# Patient Record
Sex: Male | Born: 1955 | Race: White | Hispanic: No | Marital: Married | Smoking: Never smoker
Health system: Southern US, Community
[De-identification: ages and names within clinical notes are randomized; demographics above are authoritative.]

## PROBLEM LIST (undated history)

## (undated) DIAGNOSIS — N189 Chronic kidney disease, unspecified: Secondary | ICD-10-CM

## (undated) DIAGNOSIS — K219 Gastro-esophageal reflux disease without esophagitis: Secondary | ICD-10-CM

## (undated) DIAGNOSIS — T7840XA Allergy, unspecified, initial encounter: Secondary | ICD-10-CM

## (undated) DIAGNOSIS — I10 Essential (primary) hypertension: Secondary | ICD-10-CM

## (undated) DIAGNOSIS — J45909 Unspecified asthma, uncomplicated: Secondary | ICD-10-CM

## (undated) DIAGNOSIS — G454 Transient global amnesia: Secondary | ICD-10-CM

## (undated) DIAGNOSIS — R51 Headache: Secondary | ICD-10-CM

## (undated) DIAGNOSIS — R519 Headache, unspecified: Secondary | ICD-10-CM

## (undated) HISTORY — DX: Allergy, unspecified, initial encounter: T78.40XA

## (undated) HISTORY — PX: LITHOTRIPSY: SUR834

## (undated) HISTORY — PX: URETEROSCOPY: SHX842

---

## 2015-08-29 ENCOUNTER — Observation Stay: Payer: BLUE CROSS/BLUE SHIELD

## 2015-08-29 ENCOUNTER — Observation Stay
Admit: 2015-08-29 | Discharge: 2015-08-29 | Disposition: A | Discharge status: Home / Self Care | Payer: BLUE CROSS/BLUE SHIELD | Attending: Internal Medicine | Admitting: Internal Medicine

## 2015-08-29 ENCOUNTER — Observation Stay
Admission: EM | Admit: 2015-08-29 | Discharge: 2015-08-30 | Disposition: A | Discharge status: Home / Self Care | Payer: BLUE CROSS/BLUE SHIELD | Attending: Internal Medicine | Admitting: Internal Medicine

## 2015-08-29 ENCOUNTER — Encounter: Payer: Self-pay | Admitting: Emergency Medicine

## 2015-08-29 ENCOUNTER — Emergency Department: Payer: BLUE CROSS/BLUE SHIELD

## 2015-08-29 DIAGNOSIS — Z8249 Family history of ischemic heart disease and other diseases of the circulatory system: Secondary | ICD-10-CM | POA: Insufficient documentation

## 2015-08-29 DIAGNOSIS — Z820 Family history of epilepsy and other diseases of the nervous system: Secondary | ICD-10-CM | POA: Insufficient documentation

## 2015-08-29 DIAGNOSIS — K219 Gastro-esophageal reflux disease without esophagitis: Secondary | ICD-10-CM | POA: Diagnosis not present

## 2015-08-29 DIAGNOSIS — G459 Transient cerebral ischemic attack, unspecified: Secondary | ICD-10-CM

## 2015-08-29 DIAGNOSIS — N179 Acute kidney failure, unspecified: Secondary | ICD-10-CM | POA: Diagnosis not present

## 2015-08-29 DIAGNOSIS — G454 Transient global amnesia: Secondary | ICD-10-CM | POA: Diagnosis not present

## 2015-08-29 DIAGNOSIS — Z7982 Long term (current) use of aspirin: Secondary | ICD-10-CM | POA: Insufficient documentation

## 2015-08-29 DIAGNOSIS — R410 Disorientation, unspecified: Principal | ICD-10-CM | POA: Insufficient documentation

## 2015-08-29 DIAGNOSIS — I081 Rheumatic disorders of both mitral and tricuspid valves: Secondary | ICD-10-CM | POA: Diagnosis not present

## 2015-08-29 DIAGNOSIS — I10 Essential (primary) hypertension: Secondary | ICD-10-CM | POA: Diagnosis not present

## 2015-08-29 DIAGNOSIS — E860 Dehydration: Secondary | ICD-10-CM | POA: Insufficient documentation

## 2015-08-29 DIAGNOSIS — Z825 Family history of asthma and other chronic lower respiratory diseases: Secondary | ICD-10-CM | POA: Insufficient documentation

## 2015-08-29 DIAGNOSIS — Z79899 Other long term (current) drug therapy: Secondary | ICD-10-CM | POA: Insufficient documentation

## 2015-08-29 HISTORY — DX: Gastro-esophageal reflux disease without esophagitis: K21.9

## 2015-08-29 HISTORY — DX: Transient global amnesia: G45.4

## 2015-08-29 LAB — COMPREHENSIVE METABOLIC PANEL
ALT: 30 U/L (ref 17–63)
AST: 29 U/L (ref 15–41)
Albumin: 4.2 g/dL (ref 3.5–5.0)
Alkaline Phosphatase: 92 U/L (ref 38–126)
Anion gap: 7 (ref 5–15)
BUN: 21 mg/dL — AB (ref 6–20)
CHLORIDE: 105 mmol/L (ref 101–111)
CO2: 26 mmol/L (ref 22–32)
Calcium: 9.1 mg/dL (ref 8.9–10.3)
Creatinine, Ser: 1.33 mg/dL — ABNORMAL HIGH (ref 0.61–1.24)
GFR, EST NON AFRICAN AMERICAN: 57 mL/min — AB (ref >60)
Glucose, Bld: 124 mg/dL — ABNORMAL HIGH (ref 65–99)
POTASSIUM: 3.9 mmol/L (ref 3.5–5.1)
SODIUM: 138 mmol/L (ref 135–145)
Total Bilirubin: 0.6 mg/dL (ref 0.3–1.2)
Total Protein: 7.1 g/dL (ref 6.5–8.1)

## 2015-08-29 LAB — CBC WITH DIFFERENTIAL/PLATELET
Basophils Absolute: 0 10*3/uL (ref 0–0.1)
Basophils Relative: 0 %
EOS ABS: 0 10*3/uL (ref 0–0.7)
HCT: 48.2 % (ref 40.0–52.0)
Hemoglobin: 16.6 g/dL (ref 13.0–18.0)
LYMPHS ABS: 0.8 10*3/uL — AB (ref 1.0–3.6)
Lymphocytes Relative: 11 %
MCH: 30.4 pg (ref 26.0–34.0)
MCHC: 34.4 g/dL (ref 32.0–36.0)
MCV: 88.4 fL (ref 80.0–100.0)
MONO ABS: 0.2 10*3/uL (ref 0.2–1.0)
Neutro Abs: 6.5 10*3/uL (ref 1.4–6.5)
Neutrophils Relative %: 86 %
PLATELETS: 166 10*3/uL (ref 150–440)
RBC: 5.45 MIL/uL (ref 4.40–5.90)
RDW: 13.1 % (ref 11.5–14.5)
WBC: 7.6 10*3/uL (ref 3.8–10.6)

## 2015-08-29 LAB — TROPONIN I: Troponin I: <0.03 ng/mL (ref <0.031)

## 2015-08-29 MED ORDER — ASPIRIN EC 81 MG PO TBEC
81.0000 mg | DELAYED_RELEASE_TABLET | Freq: Every day | ORAL | Status: DC
  Administered 2015-08-29 – 2015-08-30 (×2): 81 mg via ORAL
  Filled 2015-08-29 (×2): qty 1

## 2015-08-29 MED ORDER — OMEPRAZOLE MAGNESIUM 20 MG PO TBEC: 20.0000 mg | DELAYED_RELEASE_TABLET | Freq: Every day | ORAL | Status: DC

## 2015-08-29 MED ORDER — ENOXAPARIN SODIUM 40 MG/0.4ML ~~LOC~~ SOLN
40.0000 mg | SUBCUTANEOUS | Status: DC
  Administered 2015-08-29: 23:00:00 40 mg via SUBCUTANEOUS
  Filled 2015-08-29: qty 0.4

## 2015-08-29 MED ORDER — AMLODIPINE BESYLATE 5 MG PO TABS
5.0000 mg | ORAL_TABLET | Freq: Every day | ORAL | Status: DC
  Administered 2015-08-29: 15:00:00 5 mg via ORAL
  Filled 2015-08-29: qty 1

## 2015-08-29 MED ORDER — PANTOPRAZOLE SODIUM 40 MG PO TBEC
40.0000 mg | DELAYED_RELEASE_TABLET | Freq: Every day | ORAL | Status: DC
  Administered 2015-08-29 – 2015-08-30 (×2): 40 mg via ORAL
  Filled 2015-08-29 (×2): qty 1

## 2015-08-29 MED ORDER — ONDANSETRON HCL 4 MG/2ML IJ SOLN
4.0000 mg | Freq: Four times a day (QID) | INTRAMUSCULAR | Status: DC | PRN
  Administered 2015-08-30: 07:00:00 4 mg via INTRAVENOUS
  Filled 2015-08-29: qty 2

## 2015-08-29 MED ORDER — METOPROLOL TARTRATE 25 MG PO TABS
12.5000 mg | ORAL_TABLET | Freq: Two times a day (BID) | ORAL | Status: DC
  Administered 2015-08-29 – 2015-08-30 (×2): 12.5 mg via ORAL
  Filled 2015-08-29 (×2): qty 1

## 2015-08-29 MED ORDER — SODIUM CHLORIDE 0.9% FLUSH
3.0000 mL | Freq: Two times a day (BID) | INTRAVENOUS | Status: DC
  Administered 2015-08-29 – 2015-08-30 (×3): 3 mL via INTRAVENOUS

## 2015-08-29 MED ORDER — SODIUM CHLORIDE 0.9 % IV BOLUS (SEPSIS)
1000.0000 mL | Freq: Once | INTRAVENOUS | Status: AC
  Administered 2015-08-29: 15:00:00 1000 mL via INTRAVENOUS

## 2015-08-29 MED ORDER — AMLODIPINE BESYLATE 10 MG PO TABS
10.0000 mg | ORAL_TABLET | Freq: Every day | ORAL | Status: DC
  Administered 2015-08-30: 09:00:00 10 mg via ORAL
  Filled 2015-08-29: qty 1

## 2015-08-29 MED ORDER — ACETAMINOPHEN 650 MG RE SUPP: 650.0000 mg | Freq: Four times a day (QID) | RECTAL | Status: DC | PRN

## 2015-08-29 MED ORDER — AMLODIPINE BESYLATE 5 MG PO TABS
5.0000 mg | ORAL_TABLET | Freq: Once | ORAL | Status: AC
Start: 2015-08-29 — End: 2015-08-29
  Administered 2015-08-29: 19:00:00 5 mg via ORAL
  Filled 2015-08-29: qty 1

## 2015-08-29 MED ORDER — HYDROCHLOROTHIAZIDE 25 MG PO TABS
25.0000 mg | ORAL_TABLET | Freq: Every day | ORAL | Status: DC
  Administered 2015-08-29 – 2015-08-30 (×2): 25 mg via ORAL
  Filled 2015-08-29 (×2): qty 1

## 2015-08-29 MED ORDER — ACETAMINOPHEN 325 MG PO TABS
650.0000 mg | ORAL_TABLET | Freq: Four times a day (QID) | ORAL | Status: DC | PRN
  Administered 2015-08-29 – 2015-08-30 (×2): 650 mg via ORAL
  Filled 2015-08-29 (×2): qty 2

## 2015-08-29 NOTE — ED Notes (Signed)
Pt ambulated with steady gait to bathroom with standby assistance. No c/o of pain,  Or dizziness

## 2015-08-29 NOTE — ED Notes (Signed)
Pt report received. Computers currently down and this report is back timed. Pt had an episode at work of being confused. Pt was seen at urgent care and sent in for high blood pressure. Pt has not history of htn. 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 Also has history of hoh with bilateral hearing aides.

## 2015-08-29 NOTE — ED Notes (Addendum)
Pt presents with headache that has caused several episodes of vomiting. He also experienced some confusion while at work this morning. He was [facility] due to elevated BP (179/119). Pt is deaf and his wife is historian. Pt denies hx of htn; bp at assessment 190/114. Pt alert & oriented. NAD noted.

## 2015-08-29 NOTE — ED Notes (Addendum)
Pt states while at work this am he had a moment of confusion so he went to the urgent care where he was told to come to ER for further eval of HTN. Pt c/o headache and occipital pain as well. Pt with hx of TGA and TIA.

## 2015-08-29 NOTE — ED Notes (Signed)
Patient transported to Ultrasound 

## 2015-08-29 NOTE — ED Notes (Signed)
Mri added on

## 2015-08-29 NOTE — ED Notes (Signed)
Attempted report - diastolic bp is high so floor will not take report. Pt on his way to us and then mri and bp will be addressed on return.

## 2015-08-29 NOTE — ED Provider Notes (Signed)
[facility] Emergency Department Provider Note   ____________________________________________  Time seen: Approximately 11:28 AM  I have reviewed the triage vital signs and the nursing notes.   HISTORY  Chief Complaint Altered Mental Status and Hypertension    HPI [name] is a 60 y.o. male who is very hard of hearing. His wife helps with the history. History is as follows. He woke up in the middle of the night last night with the headache he occasionally gets which is very bad and leads to vomit. He took some Excedrin Migraine went back to bed woke up in the morning took the dog was still vomiting vomited outside again went to work and an episode of confusion where he remembered where he was but not what he was doing or what he was there. Wife was called when she got there he was still "disoriented" not exactly sure how she is not really able to explain and well. Blood pressure was elevated at that time and still up somewhat 163 systolic she has no history of high blood pressure. 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 Patient is now back to his baseline. His headache is still present but much better.   Past Medical History  Diagnosis Date  . TGA (transient global amnesia)   . GERD (gastroesophageal reflux disease)     Patient Active Problem List   Diagnosis Date Noted  . Confusion 08/29/2015    History reviewed. No pertinent past surgical history.  No current outpatient prescriptions on file.  Allergies Review of patient's allergies indicates no known allergies.  Family History  Problem Relation Age of Onset  . COPD Mother   . CAD Mother   . Alzheimer's disease Father     Social History Social History  Substance Use Topics  . Smoking status: Never Smoker   . Smokeless tobacco: None  . Alcohol Use: No    Review of Systems Constitutional: No fever/chills Eyes: No visual  changes. ENT: No sore throat. Cardiovascular: Denies chest pain. Respiratory: Denies shortness of breath. Gastrointestinal: No abdominal pain.  No nausea, no vomiting.  No diarrhea.  No constipation. Genitourinary: Negative for dysuria. Musculoskeletal: Negative for back pain. Skin: Negative for rash. Neurological: See history of present illness  10-point ROS otherwise negative.  ____________________________________________   PHYSICAL EXAM:  VITAL SIGNS: ED Triage Vitals  Enc Vitals Group     BP 08/29/15 1019 188/107 mmHg     Pulse Rate 08/29/15 1019 70     Resp 08/29/15 1019 18     Temp 08/29/15 1019 97.6 F (36.4 C)     Temp Source 08/29/15 1019 Oral     SpO2 08/29/15 1019 96 %     Weight 08/29/15 1019 170 lb (77.111 kg)     Height 08/29/15 1019  (1.753 m)     Head Cir --      Peak Flow --      Pain Score 08/29/15 1020 6     Pain Loc --      Pain Edu? --      Excl. in GC? --     Constitutional: Alert and oriented. Well appearing and in no acute distress. Eyes: Conjunctivae are normal. PERRL. EOMI.Fundi appear normal Head: Atraumatic. Nose: No congestion/rhinnorhea. Mouth/Throat: Mucous membranes are moist.  Oropharynx non-erythematous. Neck: No stridor. Cardiovascular: Normal rate, regular rhythm. Grossly normal heart sounds.  Good peripheral circulation. Respiratory: Normal respiratory effort.  No retractions. Lungs CTAB. Gastrointestinal: Soft and nontender. No distention. No abdominal bruits. No CVA tenderness. Musculoskeletal: No lower extremity tenderness nor edema.  No joint effusions. Neurologic:  Normal speech and language. No gross focal neurologic deficits are appreciated. Cranial nerves II through XII are intact except for of course that his hearing is very bad. Rapid alternating movements and finger to nose are normal bilaterally. Motor strength is 5 over 5 throughout. Sensation is intact throughout. No gait instability. Skin:  Skin is warm, dry and  intact. No rash noted. Psychiatric: Mood and affect are normal. Speech and behavior are normal.  ____________________________________________   LABS (all labs ordered are listed, but only abnormal results are displayed)  Labs Reviewed  COMPREHENSIVE METABOLIC PANEL - Abnormal; Notable for the following:    Glucose, Bld 124 (*)    BUN 21 (*)    Creatinine, Ser 1.33 (*)    GFR calc non Af Amer 57 (*)    All other components within normal limits  CBC WITH DIFFERENTIAL/PLATELET - Abnormal; Notable for the following:    Lymphs Abs 0.8 (*)    All other components within normal limits  TROPONIN I  BASIC METABOLIC PANEL  CBC  LIPID PANEL  TSH  VITAMIN B12   ____________________________________________  EKG  EKG done at urgent care shows no shows normal sinus rhythm rates approximately actually 65. Normal axis no acute ST-T wave changes. ____________________________________________  RADIOLOGY  CT read by radiology as normal. ____________________________________________   PROCEDURES    ____________________________________________   INITIAL IMPRESSION / ASSESSMENT AND PLAN / ED COURSE  Pertinent labs & imaging results that were available during my care of the patient were reviewed by me and considered in my medical decision making (see chart for details).  Discussed with Dr. Thad Rangereynolds neurology who recommends admission. ____________________________________________   FINAL CLINICAL IMPRESSION(S) / ED DIAGNOSES  Final diagnoses:  Transient cerebral ischemia, unspecified transient cerebral ischemia type      NEW MEDICATIONS STARTED DURING THIS VISIT:  Current Discharge Medication List       Note:  This document was prepared using Dragon voice recognition software and may include unintentional dictation errors.    Arnaldo NatalPaul F Jovie Swanner, MD 08/29/15 2046

## 2015-08-29 NOTE — Consult Note (Signed)
Reason for Consult:Transient episode of confusion Referring Physician: Renae Gloss  CC: Transient episode of confusion  HPI: [name] is an 60 y.o. male with a history of headaches who reports awakening at about 0330 with headache.  Took OTC medication at that time.  Woke up again at about 0530 and had some dry-heaving.  Was able to go to work where it seems that his headache worsened.  Had to go outside to vomit.  Was able to start his job fine but when a co-worker came in to ask questions he became confused.  Patient remembers very little after that.  It seems that his BP was elevated.  His wife was called and the patient was brought in for evaluation.  She reports that late this morning he seemed to be getting better and by 1400 was back to his baseline.  He is still amnestic of some events today.    Past Medical History  Diagnosis Date  . TGA (transient global amnesia)   . GERD (gastroesophageal reflux disease)     History reviewed. No pertinent past surgical history.  Family History  Problem Relation Age of Onset  . COPD Mother   . CAD Mother   . Alzheimer's disease Father     Social History:  reports that he has never smoked. He does not have any smokeless tobacco history on file. He reports that he does not drink alcohol or use illicit drugs.  No Known Allergies  Medications:  I have reviewed the patient's current medications. Prior to Admission:  Prescriptions prior to admission  Medication Sig Dispense Refill Last Dose  . omeprazole (PRILOSEC OTC) 20 MG tablet Take 20 mg by mouth daily.   08/29/2015 at Unknown time   Scheduled: . amLODipine  5 mg Oral Daily  . aspirin EC  81 mg Oral Daily  . enoxaparin (LOVENOX) injection  40 mg Subcutaneous Q24H  . hydrochlorothiazide  25 mg Oral Daily  . pantoprazole  40 mg Oral Daily  . sodium chloride  1,000 mL Intravenous Once  . sodium chloride flush  3 mL Intravenous Q12H    ROS: History obtained from the patient  General  ROS: negative for - chills, fatigue, fever, night sweats, weight gain or weight loss Psychological ROS: negative for - behavioral disorder, hallucinations, memory difficulties, mood swings or suicidal ideation Ophthalmic ROS: negative for - blurry vision, double vision, eye pain or loss of vision ENT ROS: poor hearing requiring hearing aids Allergy and Immunology ROS: negative for - hives or itchy/watery eyes Hematological and Lymphatic ROS: negative for - bleeding problems, bruising or swollen lymph nodes Endocrine ROS: negative for - galactorrhea, hair pattern changes, polydipsia/polyuria or temperature intolerance Respiratory ROS: negative for - cough, hemoptysis, shortness of breath or wheezing Cardiovascular ROS: negative for - chest pain, dyspnea on exertion, edema or irregular heartbeat Gastrointestinal ROS: negative for - abdominal pain, diarrhea, hematemesis, nausea/vomiting or stool incontinence Genito-Urinary ROS: negative for - dysuria, hematuria, incontinence or urinary frequency/urgency Musculoskeletal ROS: negative for - joint swelling or muscular weakness Neurological ROS: as noted in HPI Dermatological ROS: negative for rash and skin lesion changes  Physical Examination: Blood pressure 202/99, pulse 67, temperature 97.8 F (36.6 C), temperature source Oral, resp. rate 18, height 5\' 9"  (1.753 m), weight 77.111 kg (170 lb), SpO2 96 %.  HEENT-  Normocephalic, no lesions, without obvious abnormality.  Normal external eye and conjunctiva.  Normal TM's bilaterally.  Normal auditory canals and external ears. Normal external nose, mucus membranes and  septum.  Normal pharynx. Cardiovascular- S1, S2 normal, pulses palpable throughout   Lungs- chest clear, no wheezing, rales, normal symmetric air entry Abdomen- soft, non-tender; bowel sounds normal; no masses,  no organomegaly Extremities- no edema Lymph-no adenopathy palpable Musculoskeletal-no joint tenderness, deformity or  swelling Skin-warm and dry, no hyperpigmentation, vitiligo, or suspicious lesions  Neurological Examination Mental Status: Alert, oriented, thought content appropriate.  Speech fluent without evidence of aphasia.  + dysarthria.  Able to follow 3 step commands without difficulty. Cranial Nerves: II: Discs flat bilaterally; Visual fields grossly normal, pupils equal, round, reactive to light and accommodation III,IV, VI: ptosis not present, extra-ocular motions intact bilaterally V,VII: smile symmetric, facial light touch sensation normal bilaterally VIII: hearing normal bilaterally IX,X: gag reflex present XI: bilateral shoulder shrug XII: midline tongue extension Motor: Right : Upper extremity   5/5    Left:     Upper extremity   5/5  Lower extremity   5/5     Lower extremity   5/5 Tone and bulk:normal tone throughout; no atrophy noted Sensory: Pinprick and light touch intact throughout, bilaterally Deep Tendon Reflexes: 2+ and symmetric throughout Plantars: Right: downgoing   Left: downgoing Cerebellar: Normal finger-to-nose and normal heel-to-shin testing bilaterally Gait: not tested due to safety concerns    Laboratory Studies:   Basic Metabolic Panel:  Recent Labs Lab 08/29/15 1116  NA 138  K 3.9  CL 105  CO2 26  GLUCOSE 124*  BUN 21*  CREATININE 1.33*  CALCIUM 9.1    Liver Function Tests:  Recent Labs Lab 08/29/15 1116  AST 29  ALT 30  ALKPHOS 92  BILITOT 0.6  PROT 7.1  ALBUMIN 4.2   No results for input(s): LIPASE, AMYLASE in the last 168 hours. No results for input(s): AMMONIA in the last 168 hours.  CBC:  Recent Labs Lab 08/29/15 1116  WBC 7.6  NEUTROABS 6.5  HGB 16.6  HCT 48.2  MCV 88.4  PLT 166    Cardiac Enzymes:  Recent Labs Lab 08/29/15 1116  TROPONINI <0.03    BNP: Invalid input(s): POCBNP  CBG: No results for input(s): GLUCAP in the last 168 hours.  Microbiology: No results found for this or any previous  visit.  Coagulation Studies: No results for input(s): LABPROT, INR in the last 72 hours.  Urinalysis: No results for input(s): COLORURINE, LABSPEC, PHURINE, GLUCOSEU, HGBUR, BILIRUBINUR, KETONESUR, PROTEINUR, UROBILINOGEN, NITRITE, LEUKOCYTESUR in the last 168 hours.  Invalid input(s): APPERANCEUR  Lipid Panel:  No results found for: CHOL, TRIG, HDL, CHOLHDL, VLDL, LDLCALC  HgbA1C: No results found for: HGBA1C  Urine Drug Screen:  No results found for: LABOPIA, COCAINSCRNUR, LABBENZ, AMPHETMU, THCU, LABBARB  Alcohol Level: No results for input(s): ETH in the last 168 hours.  Other results: EKG: sinus rhythm at 65 bpm.  Imaging: Ct Head Wo Contrast  08/29/2015  CLINICAL DATA:  Headache, confusion. EXAM: CT HEAD WITHOUT CONTRAST TECHNIQUE: Contiguous axial images were obtained from the base of the skull through the vertex without intravenous contrast. COMPARISON:  None. FINDINGS: Bony calvarium appears intact. No mass effect or midline shift is noted. Ventricular size is within normal limits. There is no evidence of mass lesion, hemorrhage or acute infarction. IMPRESSION: Normal head CT. Electronically Signed   By: Lupita Raider, M.D.   On: 08/29/2015 11:11   Mr Brain Wo Contrast  08/29/2015  CLINICAL DATA:  Confusion.  Headache EXAM: MRI HEAD WITHOUT CONTRAST TECHNIQUE: Multiplanar, multiecho pulse sequences of the brain and surrounding structures  were obtained without intravenous contrast. COMPARISON:  CT head 08/29/2015 FINDINGS: Ventricle size normal.  Cerebral volume normal. Negative for acute or chronic infarction. Benign-appearing calcification left superior cerebellum. Negative for intracranial hemorrhage. No cyst or fluid collection. Negative for mass or edema.  No shift of the midline structures. Circle of Willis patent.  Normal pituitary and skullbase. Mucosal edema in the paranasal sinuses without air-fluid level. No orbital mass lesion. IMPRESSION: Negative MRI of the brain  without contrast. Benign-appearing calcification left superior cerebellum. Mild mucosal edema in the paranasal sinuses. Electronically Signed   By: Marlan Palauharles  Clark M.D.   On: 08/29/2015 14:34   [place] Carotid Bilateral  08/29/2015  CLINICAL DATA:  Confusion. EXAM: BILATERAL CAROTID DUPLEX ULTRASOUND TECHNIQUE: Wallace CullensGray scale imaging, color Doppler and duplex ultrasound were performed of bilateral carotid and vertebral arteries in the neck. COMPARISON:  None. FINDINGS: Criteria: Quantification of carotid stenosis is based on velocity parameters that correlate the residual internal carotid diameter with NASCET-based stenosis levels, using the diameter of the distal internal carotid lumen as the denominator for stenosis measurement. The following velocity measurements were obtained: RIGHT ICA:  79/32 cm/sec CCA:  86/15 cm/sec SYSTOLIC ICA/CCA RATIO:  0.93 DIASTOLIC ICA/CCA RATIO:  2.21 ECA:  77 cm/sec LEFT ICA:  98/39 cm/sec CCA:  93/21 cm/sec SYSTOLIC ICA/CCA RATIO:  1.06 DIASTOLIC ICA/CCA RATIO:  1.84 ECA:  95 cm/sec RIGHT CAROTID ARTERY: No significant plaque or stenosis is noted. RIGHT VERTEBRAL ARTERY:  Antegrade flow is noted. LEFT CAROTID ARTERY: Mild eccentric noncalcified plaque is noted in the left carotid bulb and proximal left internal carotid artery consistent with less than 50% diameter stenosis based on ultrasound and Doppler criteria. LEFT VERTEBRAL ARTERY:  Antegrade flow is noted. IMPRESSION: No significant plaque or stenosis is noted in the right cervical carotid arteries. Mild eccentric noncalcified plaque is noted in the left carotid bulb and proximal left internal carotid artery consistent with less than 50% diameter stenosis based on ultrasound and Doppler criteria. Electronically Signed   By: Lupita RaiderJames  Green Jr, M.D.   On: 08/29/2015 14:17   Dg Chest Portable 1 View  08/29/2015  CLINICAL DATA:  Headache.  Vomiting.  Confusion. EXAM: PORTABLE CHEST 1 VIEW COMPARISON:  None. FINDINGS: Midline trachea.   Normal heart size and mediastinal contours. Sharp costophrenic angles.  No pneumothorax.  Clear lungs. IMPRESSION: No active disease. Electronically Signed   By: Jeronimo GreavesKyle  Talbot M.D.   On: 08/29/2015 12:09     Assessment/Plan: 60 year old male presenting after an episode of transient confusion.  Patient now at baseline although he continues to have a period of time that he can not recall.  Neurological examination is nonfocal.  MRI of the brain has been personally reviewed and shows no acute changes.  Episode somewhat short for a recurrent period of TGA.  Possible relationship to elevated BP.  Recommendations: 1  BP control 2. EEG 3. TSH, B12  Thana FarrLeslie Katarzyna Wolven, MD Neurology [phone] 08/29/2015, 3:29 PM

## 2015-08-29 NOTE — H&P (Signed)
[facility] - [place] at [facility]   PATIENT NAME: [name] Barreira    MR#:  [id number]  DATE OF BIRTH:  [birth year]  DATE OF ADMISSION:  08/29/2015  PRIMARY CARE PHYSICIAN: No PCP Per Patient   REQUESTING/REFERRING PHYSICIAN: Dr Dorothea GlassmanPaul Malinda  CHIEF COMPLAINT:   Chief Complaint  Patient presents with  . Altered Mental Status  . Hypertension    HISTORY OF PRESENT ILLNESS:  [name] Shaler  is a 60 y.o. male with a known history of A transient global amnesia episode a couple years ago. He woke up this morning at 3:30 AM with a headache. He took some Excedrin Migraine and went back to sleep. This morning he vomited twice prior to going to work. He ate breakfast. He went to work and he vomited again there. People at work were asking him questions and he was confused with the questions being asked and did not understand them. They took his blood pressure and is very elevated. They sent him over to the walk-in clinic and again his blood pressure was very elevated at that time. He was sent into the ER for further evaluation. Right now he feels okay. He does feel a little bit tired. No weakness one side versus the other.  PAST MEDICAL HISTORY:   Past Medical History  Diagnosis Date  . TGA (transient global amnesia)   . GERD (gastroesophageal reflux disease)     PAST SURGICAL HISTORY:  History reviewed. No pertinent past surgical history.  SOCIAL HISTORY:   Social History  Substance Use Topics  . Smoking status: Never Smoker   . Smokeless tobacco: Not on file  . Alcohol Use: No    FAMILY HISTORY:   Family History  Problem Relation Age of Onset  . COPD Mother   . CAD Mother   . Alzheimer's disease Father     DRUG ALLERGIES:  No Known Allergies  REVIEW OF SYSTEMS:  CONSTITUTIONAL: No fever, Positive for tiredness.  EYES: No blurred or double vision. Wears reading glasses EARS, NOSE, AND THROAT: No tinnitus or ear pain. No sore throat. RESPIRATORY: No  cough, shortness of breath, wheezing or hemoptysis.  CARDIOVASCULAR: No chest pain, orthopnea, edema.  GASTROINTESTINAL: Positive for nausea and vomiting. No diarrhea or abdominal pain. Occasionally sees blood with wiping. GENITOURINARY: No dysuria, hematuria.  ENDOCRINE: No polyuria, nocturia,  HEMATOLOGY: No anemia, easy bruising or bleeding SKIN: No rash or lesion. MUSCULOSKELETAL: No joint pain or arthritis.   NEUROLOGIC: No tingling, numbness, weakness.  PSYCHIATRY: No anxiety or depression.   MEDICATIONS AT HOME:   Prior to Admission medications   Medication Sig Start Date End Date Taking? Authorizing Provider  omeprazole (PRILOSEC OTC) 20 MG tablet Take 20 mg by mouth daily.   Yes Historical Provider, MD      VITAL SIGNS:  Blood pressure 180/118, pulse 69, temperature 97.6 F (36.4 C), temperature source Oral, resp. rate 12, height 5\' 9"  (1.753 m), weight 77.111 kg (170 lb), SpO2 99 %.  PHYSICAL EXAMINATION:  GENERAL:  60 y.o.-year-old patient lying in the bed with no acute distress.  EYES: Pupils equal, round, reactive to light and accommodation. No scleral icterus. Extraocular muscles intact.  HEENT: Head atraumatic, normocephalic. Oropharynx and nasopharynx clear.  NECK:  Supple, no jugular venous distention. No thyroid enlargement, no tenderness.  LUNGS: Normal breath sounds bilaterally, no wheezing, rales,rhonchi or crepitation. No use of accessory muscles of respiration.  CARDIOVASCULAR: S1, S2 normal. No murmurs, rubs, or gallops.  ABDOMEN: Soft, nontender, nondistended.  Bowel sounds present. No organomegaly or mass.  EXTREMITIES: No pedal edema, cyanosis, or clubbing.  NEUROLOGIC: Patient is hearing impaired. Other cranial nerves intact. Muscle strength 5/5 in all extremities. Sensation intact. Patient stood up when he had a leg cramp. And able to balance himself. Babinski negative bilaterally PSYCHIATRIC: The patient is alert and oriented x 3.  SKIN: No rash, lesion,  or ulcer.   LABORATORY PANEL:   CBC  Recent Labs Lab 08/29/15 1116  WBC 7.6  HGB 16.6  HCT 48.2  PLT 166   ------------------------------------------------------------------------------------------------------------------  Chemistries   Recent Labs Lab 08/29/15 1116  NA 138  K 3.9  CL 105  CO2 26  GLUCOSE 124*  BUN 21*  CREATININE 1.33*  CALCIUM 9.1  AST 29  ALT 30  ALKPHOS 92  BILITOT 0.6   ------------------------------------------------------------------------------------------------------------------  Cardiac Enzymes  Recent Labs Lab 08/29/15 1116  TROPONINI <0.03   ------------------------------------------------------------------------------------------------------------------  RADIOLOGY:  Ct Head Wo Contrast  08/29/2015  CLINICAL DATA:  Headache, confusion. EXAM: CT HEAD WITHOUT CONTRAST TECHNIQUE: Contiguous axial images were obtained from the base of the skull through the vertex without intravenous contrast. COMPARISON:  None. FINDINGS: Bony calvarium appears intact. No mass effect or midline shift is noted. Ventricular size is within normal limits. There is no evidence of mass lesion, hemorrhage or acute infarction. IMPRESSION: Normal head CT. Electronically Signed   By: Lupita Raider, M.D.   On: 08/29/2015 11:11   Dg Chest Portable 1 View  08/29/2015  CLINICAL DATA:  Headache.  Vomiting.  Confusion. EXAM: PORTABLE CHEST 1 VIEW COMPARISON:  None. FINDINGS: Midline trachea.  Normal heart size and mediastinal contours. Sharp costophrenic angles.  No pneumothorax.  Clear lungs. IMPRESSION: No active disease. Electronically Signed   By: Jeronimo Greaves M.D.   On: 08/29/2015 12:09    IMPRESSION AND PLAN:   1. Confusion episode. Admit as an observation. We'll get an MRI of the brain to rule out stroke. This could also be secondary to elevated blood pressure. If this is a stroke I do not want to lower his blood pressure too much at this point in time. Blood  pressure can be above 160/90. Give aspirin. 2. Accelerated hypertension. Again if this is a stroke I do not want to lower his blood pressure too quickly. I will start low-dose Norvasc and continue to monitor. 3. Nausea vomiting could be secondary to the accelerated hypertension. When necessary Zofran. 4. Dehydration and acute kidney injury. We'll give IV fluid hydration. 5. Gastroesophageal reflux disease without esophagitis on PPI as outpatient and I will continue as inpatient  All the records are reviewed and case discussed with ED provider. Management plans discussed with the patient, family and they are in agreement.  CODE STATUS: Full code  TOTAL TIME TAKING CARE OF THIS PATIENT: 55 minutes.    Alford Highland M.D on 08/29/2015 at 12:48 PM  Between 7am to 6pm - Pager - [phone]  After 6pm call admission pager [phone]  [facility] Office  [phone]  CC: Primary care physician; No PCP Per Patient

## 2015-08-29 NOTE — ED Notes (Signed)
Report given to floor - dr wieting made aware of current bp. No additional orders given.

## 2015-08-29 NOTE — Progress Notes (Signed)
   08/29/15 1801  Vitals  Temp 98.7 F (37.1 C)  Temp Source Oral  BP (!) 180/104 mmHg  MAP (mmHg) 121  BP Location Left Arm  BP Method Automatic  Patient Position (if appropriate) Lying  Pulse Rate 64   Made Dr. Renae GlossWieting aware of pt's current BP.  New orders for Amlodipine 5mg  now and change to Amlodipine10mg  starting tomorrow.  Orson Apeanielle Jolane Bankhead, RN

## 2015-08-30 ENCOUNTER — Observation Stay: Payer: BLUE CROSS/BLUE SHIELD

## 2015-08-30 DIAGNOSIS — R410 Disorientation, unspecified: Secondary | ICD-10-CM | POA: Diagnosis not present

## 2015-08-30 LAB — BASIC METABOLIC PANEL
Anion gap: 7 (ref 5–15)
BUN: 21 mg/dL — AB (ref 6–20)
CHLORIDE: 103 mmol/L (ref 101–111)
CO2: 29 mmol/L (ref 22–32)
Calcium: 9.2 mg/dL (ref 8.9–10.3)
Creatinine, Ser: 1.55 mg/dL — ABNORMAL HIGH (ref 0.61–1.24)
GFR calc Af Amer: 55 mL/min — ABNORMAL LOW (ref >60)
GFR calc non Af Amer: 47 mL/min — ABNORMAL LOW (ref >60)
GLUCOSE: 109 mg/dL — AB (ref 65–99)
POTASSIUM: 3.6 mmol/L (ref 3.5–5.1)
Sodium: 139 mmol/L (ref 135–145)

## 2015-08-30 LAB — LIPID PANEL
Cholesterol: 194 mg/dL (ref 0–200)
HDL: 33 mg/dL — ABNORMAL LOW (ref >40)
LDL CALC: 130 mg/dL — AB (ref 0–99)
Total CHOL/HDL Ratio: 5.9 RATIO
Triglycerides: 155 mg/dL — ABNORMAL HIGH (ref <150)
VLDL: 31 mg/dL (ref 0–40)

## 2015-08-30 LAB — VITAMIN B12: Vitamin B-12: 270 pg/mL (ref 180–914)

## 2015-08-30 LAB — CBC
HEMATOCRIT: 49.5 % (ref 40.0–52.0)
Hemoglobin: 16.7 g/dL (ref 13.0–18.0)
MCH: 30.2 pg (ref 26.0–34.0)
MCHC: 33.8 g/dL (ref 32.0–36.0)
MCV: 89.4 fL (ref 80.0–100.0)
Platelets: 174 10*3/uL (ref 150–440)
RBC: 5.53 MIL/uL (ref 4.40–5.90)
RDW: 13.3 % (ref 11.5–14.5)
WBC: 8.3 10*3/uL (ref 3.8–10.6)

## 2015-08-30 LAB — TSH: TSH: 0.478 u[IU]/mL (ref 0.350–4.500)

## 2015-08-30 MED ORDER — METOPROLOL TARTRATE 25 MG PO TABS: 12.5000 mg | ORAL_TABLET | Freq: Two times a day (BID) | ORAL | Status: DC

## 2015-08-30 MED ORDER — AMLODIPINE BESYLATE 10 MG PO TABS: 10.0000 mg | ORAL_TABLET | Freq: Every day | ORAL | Status: DC

## 2015-08-30 MED ORDER — ASPIRIN 81 MG PO TBEC: 81.0000 mg | DELAYED_RELEASE_TABLET | Freq: Every day | ORAL | Status: AC

## 2015-08-30 MED ORDER — BUTALBITAL-APAP-CAFFEINE 50-325-40 MG PO TABS
1.0000 | ORAL_TABLET | Freq: Four times a day (QID) | ORAL | Status: DC | PRN
  Administered 2015-08-30: 1 via ORAL
  Filled 2015-08-30: qty 1

## 2015-08-30 MED ORDER — BUTALBITAL-APAP-CAFFEINE 50-325-40 MG PO TABS: 1.0000 | ORAL_TABLET | Freq: Four times a day (QID) | ORAL | Status: DC | PRN

## 2015-08-30 NOTE — Progress Notes (Signed)
Received MD order to discharge patient to home reviewed discharge instructions home meds, prescriptions and follow up appointments with patient and patient verbalized understanding, discharge with auxillary and wife to home

## 2015-08-30 NOTE — Discharge Summary (Signed)
[facility] - [place] at [facility]   PATIENT NAME: [name]    MR#:  [id number]  DATE OF BIRTH:  12/26/1955  DATE OF ADMISSION:  08/29/2015 ADMITTING PHYSICIAN: Alford Highland, MD  DATE OF DISCHARGE: 08/30/2015  PRIMARY CARE PHYSICIAN: No PCP Per Patient    ADMISSION DIAGNOSIS:  Confusion [R41.0] Transient cerebral ischemia, unspecified transient cerebral ischemia type [G45.9]  DISCHARGE DIAGNOSIS:  Active Problems:   Confusion   SECONDARY DIAGNOSIS:   Past Medical History  Diagnosis Date  . TGA (transient global amnesia)   . GERD (gastroesophageal reflux disease)     HOSPITAL COURSE:    60 year old male with a history of headaches who woke up with transient episode of confusion. For further details please refer the H&P.  1. Confusion: MRI was negative for acute stroke. Neurology was consulted and did not feel this was a TIA or CVA. It is likely that confusion was due to elevated blood pressure. This is not back to be transient global amnesia as his symptoms of confusion improved fairly quickly. Echocardiogram showed no evidence of ventricular dysfunction or major valvular abnormalities. Carotid Doppler showed no evidence of hemodynamically significant stenosis. EEG was normal.   I will refer him to neurology for chronic headaches. 2. Elevated blood pressure no formal diagnosis of hypertension: It is noted that patient's blood pressure was elevated throughout hospital stay. He was initiated on blood pressure medications. He will be discharged on blood pressure medications and was asked to take his blood pressure on a daily basis and write it down for his primary care physician to adjust medications.  3. GERD: Patient continue PPI.  DISCHARGE CONDITIONS AND DIET:   Stable for discharge on a regular diet  CONSULTS OBTAINED:  Treatment Team:  Kym Groom, MD  DRUG ALLERGIES:  No Known Allergies  DISCHARGE MEDICATIONS:   Current Discharge  Medication List    START taking these medications   Details  amLODipine (NORVASC) 10 MG tablet Take 1 tablet (10 mg total) by mouth daily. Qty: 30 tablet, Refills: 0    aspirin EC 81 MG EC tablet Take 1 tablet (81 mg total) by mouth daily. Qty: 120 tablet, Refills: 0    metoprolol tartrate (LOPRESSOR) 25 MG tablet Take 0.5 tablets (12.5 mg total) by mouth 2 (two) times daily. Qty: 60 tablet, Refills: 0      CONTINUE these medications which have NOT CHANGED   Details  omeprazole (PRILOSEC OTC) 20 MG tablet Take 20 mg by mouth daily.              Today   CHIEF COMPLAINT:   Patient doing well this morning. Has a headache which is chronic in nature   VITAL SIGNS:  Blood pressure 159/96, pulse 72, temperature 97.8 F (36.6 C), temperature source Oral, resp. rate 17, height 5\' 9"  (1.753 m), weight 79.017 kg (174 lb 3.2 oz), SpO2 97 %.   REVIEW OF SYSTEMS:  Review of Systems  Constitutional: Negative for fever, chills and malaise/fatigue.  HENT: Negative for ear discharge, ear pain, hearing loss, nosebleeds and sore throat.   Eyes: Negative for blurred vision and pain.  Respiratory: Negative for cough, hemoptysis, shortness of breath and wheezing.   Cardiovascular: Negative for chest pain, palpitations and leg swelling.  Gastrointestinal: Negative for nausea, vomiting, abdominal pain, diarrhea and blood in stool.  Genitourinary: Negative for dysuria.  Musculoskeletal: Negative for back pain.  Neurological: Positive for headaches. Negative for dizziness, tremors, speech change, focal weakness and seizures.  Endo/Heme/Allergies: Does not bruise/bleed easily.  Psychiatric/Behavioral: Negative for depression, suicidal ideas and hallucinations.     PHYSICAL EXAMINATION:  GENERAL:  60 y.o.-year-old patient lying in the bed with no acute distress.  NECK:  Supple, no jugular venous distention. No thyroid enlargement, no tenderness.  LUNGS: Normal breath sounds bilaterally,  no wheezing, rales,rhonchi  No use of accessory muscles of respiration.  CARDIOVASCULAR: S1, S2 normal. No murmurs, rubs, or gallops.  ABDOMEN: Soft, non-tender, non-distended. Bowel sounds present. No organomegaly or mass.  EXTREMITIES: No pedal edema, cyanosis, or clubbing.  PSYCHIATRIC: The patient is alert and oriented x 3.  SKIN: No obvious rash, lesion, or ulcer.   DATA REVIEW:   CBC  Recent Labs Lab 08/30/15 0347  WBC 8.3  HGB 16.7  HCT 49.5  PLT 174    Chemistries   Recent Labs Lab 08/29/15 1116 08/30/15 0347  NA 138 139  K 3.9 3.6  CL 105 103  CO2 26 29  GLUCOSE 124* 109*  BUN 21* 21*  CREATININE 1.33* 1.55*  CALCIUM 9.1 9.2  AST 29  --   ALT 30  --   ALKPHOS 92  --   BILITOT 0.6  --     Cardiac Enzymes  Recent Labs Lab 08/29/15 1116  TROPONINI <0.03    Microbiology Results  @[email or web address] @  RADIOLOGY:  Ct Head Wo Contrast  08/29/2015  CLINICAL DATA:  Headache, confusion. EXAM: CT HEAD WITHOUT CONTRAST TECHNIQUE: Contiguous axial images were obtained from the base of the skull through the vertex without intravenous contrast. COMPARISON:  None. FINDINGS: Bony calvarium appears intact. No mass effect or midline shift is noted. Ventricular size is within normal limits. There is no evidence of mass lesion, hemorrhage or acute infarction. IMPRESSION: Normal head CT. Electronically Signed   By: Lupita RaiderJames  Green Jr, M.D.   On: 08/29/2015 11:11   Mr Brain Wo Contrast  08/29/2015  CLINICAL DATA:  Confusion.  Headache EXAM: MRI HEAD WITHOUT CONTRAST TECHNIQUE: Multiplanar, multiecho pulse sequences of the brain and surrounding structures were obtained without intravenous contrast. COMPARISON:  CT head 08/29/2015 FINDINGS: Ventricle size normal.  Cerebral volume normal. Negative for acute or chronic infarction. Benign-appearing calcification left superior cerebellum. Negative for intracranial hemorrhage. No cyst or fluid collection. Negative for mass or edema.  No shift  of the midline structures. Circle of Willis patent.  Normal pituitary and skullbase. Mucosal edema in the paranasal sinuses without air-fluid level. No orbital mass lesion. IMPRESSION: Negative MRI of the brain without contrast. Benign-appearing calcification left superior cerebellum. Mild mucosal edema in the paranasal sinuses. Electronically Signed   By: Marlan Palauharles  Clark M.D.   On: 08/29/2015 14:34   [place] Carotid Bilateral  08/29/2015  CLINICAL DATA:  Confusion. EXAM: BILATERAL CAROTID DUPLEX ULTRASOUND TECHNIQUE: Wallace CullensGray scale imaging, color Doppler and duplex ultrasound were performed of bilateral carotid and vertebral arteries in the neck. COMPARISON:  None. FINDINGS: Criteria: Quantification of carotid stenosis is based on velocity parameters that correlate the residual internal carotid diameter with NASCET-based stenosis levels, using the diameter of the distal internal carotid lumen as the denominator for stenosis measurement. The following velocity measurements were obtained: RIGHT ICA:  79/32 cm/sec CCA:  86/15 cm/sec SYSTOLIC ICA/CCA RATIO:  0.93 DIASTOLIC ICA/CCA RATIO:  2.21 ECA:  77 cm/sec LEFT ICA:  98/39 cm/sec CCA:  93/21 cm/sec SYSTOLIC ICA/CCA RATIO:  1.06 DIASTOLIC ICA/CCA RATIO:  1.84 ECA:  95 cm/sec RIGHT CAROTID ARTERY: No significant plaque or stenosis is noted. RIGHT VERTEBRAL ARTERY:  Antegrade  flow is noted. LEFT CAROTID ARTERY: Mild eccentric noncalcified plaque is noted in the left carotid bulb and proximal left internal carotid artery consistent with less than 50% diameter stenosis based on ultrasound and Doppler criteria. LEFT VERTEBRAL ARTERY:  Antegrade flow is noted. IMPRESSION: No significant plaque or stenosis is noted in the right cervical carotid arteries. Mild eccentric noncalcified plaque is noted in the left carotid bulb and proximal left internal carotid artery consistent with less than 50% diameter stenosis based on ultrasound and Doppler criteria. Electronically Signed   By:  Lupita Raider, M.D.   On: 08/29/2015 14:17   Dg Chest Portable 1 View  08/29/2015  CLINICAL DATA:  Headache.  Vomiting.  Confusion. EXAM: PORTABLE CHEST 1 VIEW COMPARISON:  None. FINDINGS: Midline trachea.  Normal heart size and mediastinal contours. Sharp costophrenic angles.  No pneumothorax.  Clear lungs. IMPRESSION: No active disease. Electronically Signed   By: Jeronimo Greaves M.D.   On: 08/29/2015 12:09      Management plans discussed with the patient and he is in agreement. Stable for discharge home  Patient should follow up with PCP  CODE STATUS:     Code Status Orders        Start     Ordered   08/29/15 1246  Full code   Continuous     08/29/15 1247    Code Status History    Date Active Date Inactive Code Status Order ID Comments User Context   This patient has a current code status but no historical code status.      TOTAL TIME TAKING CARE OF THIS PATIENT: 35 minutes.    Note: This dictation was prepared with Dragon dictation along with smaller phrase technology. Any transcriptional errors that result from this process are unintentional.  Gwenna Fuston M.D on 08/30/2015 at 11:09 AM  Between 7am to 6pm - Pager - [phone] After 6pm go to [email or web address] - Social research officer, government  [facility]  Office  [phone]  CC: Primary care physician; No PCP Per Patient

## 2015-08-31 LAB — ECHOCARDIOGRAM COMPLETE
HEIGHTINCHES: 69 in
Weight: 2787.2 oz

## 2015-12-03 ENCOUNTER — Encounter: Payer: Self-pay | Admitting: *Deleted

## 2015-12-04 ENCOUNTER — Ambulatory Visit: Payer: BLUE CROSS/BLUE SHIELD | Admitting: Anesthesiology

## 2015-12-04 ENCOUNTER — Ambulatory Visit
Admission: RE | Admit: 2015-12-04 | Discharge: 2015-12-04 | Disposition: A | Discharge status: Home / Self Care | Payer: BLUE CROSS/BLUE SHIELD | Source: Ambulatory Visit | Attending: Unknown Physician Specialty | Admitting: Unknown Physician Specialty

## 2015-12-04 ENCOUNTER — Encounter
Admission: RE | Disposition: A | Discharge status: Home / Self Care | Payer: Self-pay | Source: Ambulatory Visit | Attending: Unknown Physician Specialty

## 2015-12-04 DIAGNOSIS — Z7982 Long term (current) use of aspirin: Secondary | ICD-10-CM | POA: Diagnosis not present

## 2015-12-04 DIAGNOSIS — Z8249 Family history of ischemic heart disease and other diseases of the circulatory system: Secondary | ICD-10-CM | POA: Insufficient documentation

## 2015-12-04 DIAGNOSIS — K5730 Diverticulosis of large intestine without perforation or abscess without bleeding: Secondary | ICD-10-CM | POA: Diagnosis not present

## 2015-12-04 DIAGNOSIS — Z79899 Other long term (current) drug therapy: Secondary | ICD-10-CM | POA: Diagnosis not present

## 2015-12-04 DIAGNOSIS — K219 Gastro-esophageal reflux disease without esophagitis: Secondary | ICD-10-CM | POA: Diagnosis not present

## 2015-12-04 DIAGNOSIS — Z1211 Encounter for screening for malignant neoplasm of colon: Secondary | ICD-10-CM | POA: Diagnosis present

## 2015-12-04 DIAGNOSIS — N189 Chronic kidney disease, unspecified: Secondary | ICD-10-CM | POA: Diagnosis not present

## 2015-12-04 DIAGNOSIS — Z820 Family history of epilepsy and other diseases of the nervous system: Secondary | ICD-10-CM | POA: Diagnosis not present

## 2015-12-04 DIAGNOSIS — I129 Hypertensive chronic kidney disease with stage 1 through stage 4 chronic kidney disease, or unspecified chronic kidney disease: Secondary | ICD-10-CM | POA: Insufficient documentation

## 2015-12-04 DIAGNOSIS — Z825 Family history of asthma and other chronic lower respiratory diseases: Secondary | ICD-10-CM | POA: Insufficient documentation

## 2015-12-04 DIAGNOSIS — J45909 Unspecified asthma, uncomplicated: Secondary | ICD-10-CM | POA: Insufficient documentation

## 2015-12-04 DIAGNOSIS — K640 First degree hemorrhoids: Secondary | ICD-10-CM | POA: Diagnosis not present

## 2015-12-04 HISTORY — DX: Unspecified asthma, uncomplicated: J45.909

## 2015-12-04 HISTORY — PX: COLONOSCOPY WITH PROPOFOL: SHX5780

## 2015-12-04 HISTORY — DX: Chronic kidney disease, unspecified: N18.9

## 2015-12-04 HISTORY — DX: Headache: R51

## 2015-12-04 HISTORY — DX: Essential (primary) hypertension: I10

## 2015-12-04 HISTORY — DX: Headache, unspecified: R51.9

## 2015-12-04 SURGERY — COLONOSCOPY WITH PROPOFOL
Anesthesia: General

## 2015-12-04 MED ORDER — KETOROLAC TROMETHAMINE 30 MG/ML IJ SOLN
INTRAMUSCULAR | Status: DC | PRN
  Administered 2015-12-04: 800 mg via INTRAVENOUS

## 2015-12-04 MED ORDER — SODIUM CHLORIDE 0.9 % IV SOLN: INTRAVENOUS | Status: DC

## 2015-12-04 MED ORDER — PROPOFOL 500 MG/50ML IV EMUL
INTRAVENOUS | Status: DC | PRN
  Administered 2015-12-04: 140 ug/kg/min via INTRAVENOUS

## 2015-12-04 MED ORDER — SODIUM CHLORIDE 0.9 % IV SOLN
INTRAVENOUS | Status: DC
Start: 2015-12-04 — End: 2015-12-04
  Administered 2015-12-04: 11:00:00 via INTRAVENOUS

## 2015-12-04 MED ORDER — PROPOFOL 10 MG/ML IV BOLUS
INTRAVENOUS | Status: DC | PRN
  Administered 2015-12-04: 50 mg via INTRAVENOUS
  Administered 2015-12-04: 90 mg via INTRAVENOUS

## 2015-12-04 NOTE — Op Note (Signed)
[facility] Gastroenterology Patient Name: [name] ArabDenis Dikes Procedure Date: 12/04/2015 11:17 AM MRN: [id number] Account #: [id number] Date of Birth: 05-18-1955 Admit Type: Outpatient Age: [age] [facility] ROOM 4 Gender: Male Note Status: Finalized Procedure:            Colonoscopy Indications:          Screening for colorectal malignant neoplasm Providers:            Scot Junobert T. Shaunice Levitan, MD Referring MD:         Kris MoutonLarry J. Gerilyn PilgrimSykes (Referring MD) Medicines:            Propofol per Anesthesia Complications:        No immediate complications. Procedure:            Pre-Anesthesia Assessment:                       - After reviewing the risks and benefits, the patient                        was deemed in satisfactory condition to undergo the                        procedure.                       After obtaining informed consent, the colonoscope was                        passed under direct vision. Throughout the procedure,                        the patient's blood pressure, pulse, and oxygen                        saturations were monitored continuously. The                        Colonoscope was introduced through the anus and                        advanced to the the cecum, identified by appendiceal                        orifice and ileocecal valve. The colonoscopy was                        performed with difficulty. Successful completion of the                        procedure was aided by lavage. The patient tolerated                        the procedure well. The quality of the bowel                        preparation was excellent. Findings:      Multiple medium-mouthed diverticula were found in the sigmoid colon and       descending colon. This made passage difficult. He was placed in supine       position and then steady lavage helped to open the lumen to navigate  better.      Internal hemorrhoids were found during endoscopy. The hemorrhoids were   medium-sized and Grade I (internal hemorrhoids that do not prolapse).      The exam was otherwise without abnormality. Impression:           - Diverticulosis in the sigmoid colon and in the                        descending colon.                       - Internal hemorrhoids.                       - The examination was otherwise normal.                       - No specimens collected. Recommendation:       - Repeat colonoscopy in 10 years for screening purposes. Scot Jun, MD 12/04/2015 11:58:00 AM This report has been signed electronically. Number of Addenda: 0 Note Initiated On: 12/04/2015 11:17 AM Scope Withdrawal Time: 0 hours 6 minutes 1 second  Total Procedure Duration: 0 hours 22 minutes 23 seconds       [facility]

## 2015-12-04 NOTE — Transfer of Care (Signed)
Immediate Anesthesia Transfer of Care Note  Patient: [name]  Procedure(s) Performed: Procedure(s): COLONOSCOPY WITH PROPOFOL (N/A)  Patient Location: PACU  Anesthesia Type:General  Level of Consciousness: awake, alert  and oriented  Airway & Oxygen Therapy: Patient Spontanous Breathing and Patient connected to nasal cannula oxygen  Post-op Assessment: Report given to RN and Post -op Vital signs reviewed and stable  Post vital signs: Reviewed and stable  Last Vitals:  Vitals:   12/04/15 1042  BP: 136/83  Pulse: 70  Resp: 16  Temp: 36.6 C    Last Pain:  Vitals:   12/04/15 1042  TempSrc: Tympanic         Complications: No apparent anesthesia complications

## 2015-12-04 NOTE — Anesthesia Preprocedure Evaluation (Signed)
Anesthesia Evaluation  Patient identified by MRN, date of birth, ID band Patient awake    Reviewed: Allergy & Precautions, NPO status , Patient's Chart, lab work & pertinent test results  History of Anesthesia Complications Negative for: history of anesthetic complications  Airway Mallampati: II  TM Distance: >3 FB Neck ROM: Full    Dental no notable dental hx.    Pulmonary asthma (mild intermittent) , neg sleep apnea,    breath sounds clear to auscultation- rhonchi (-) wheezing      Cardiovascular Exercise Tolerance: Good hypertension, Pt. on medications and Pt. on home beta blockers (-) CAD and (-) Past MI  Rhythm:Regular Rate:Normal     Neuro/Psych  Headaches,    GI/Hepatic Neg liver ROS, GERD  Medicated,  Endo/Other  negative endocrine ROSneg diabetes  Renal/GU CRFRenal disease     Musculoskeletal   Abdominal (+) - obese,   Peds  Hematology negative hematology ROS (+)   Anesthesia Other Findings Past Medical History: No date: Asthma No date: Chronic kidney disease No date: GERD (gastroesophageal reflux disease) No date: Headache No date: Hypertension No date: TGA (transient global amnesia)   Reproductive/Obstetrics                             Anesthesia Physical Anesthesia Plan  ASA: II  Anesthesia Plan: General   Post-op Pain Management:    Induction: Intravenous  Airway Management Planned: Natural Airway  Additional Equipment:   Intra-op Plan:   Post-operative Plan:   Informed Consent: I have reviewed the patients History and Physical, chart, labs and discussed the procedure including the risks, benefits and alternatives for the proposed anesthesia with the patient or authorized representative who has indicated his/her understanding and acceptance.   Dental advisory given  Plan Discussed with: CRNA and Anesthesiologist  Anesthesia Plan Comments:          Anesthesia Quick Evaluation

## 2015-12-04 NOTE — H&P (Signed)
   Primary Care Physician:  Thornton PapasSykes, Larry Justain, PA-C Primary Gastroenterologist:  Dr. Mechele CollinElliott  Pre-Procedure History & Physical: HPI:  [name] is a 60 y.o. male is here for an colonoscopy.   Past Medical History:  Diagnosis Date  . Asthma   . Chronic kidney disease   . GERD (gastroesophageal reflux disease)   . Headache   . Hypertension   . TGA (transient global amnesia)     Past Surgical History:  Procedure Laterality Date  . LITHOTRIPSY    . URETEROSCOPY      Prior to Admission medications   Medication Sig Start Date End Date Taking? Authorizing Provider  amLODipine (NORVASC) 10 MG tablet Take 1 tablet (10 mg total) by mouth daily. 08/30/15  Yes Adrian SaranSital Mody, MD  aspirin EC 81 MG EC tablet Take 1 tablet (81 mg total) by mouth daily. 08/30/15  Yes Adrian SaranSital Mody, MD  butalbital-acetaminophen-caffeine (FIORICET, ESGIC) 50-325-40 MG tablet Take 1 tablet by mouth every 6 (six) hours as needed for headache or migraine. 08/30/15  Yes Adrian SaranSital Mody, MD  omeprazole (PRILOSEC OTC) 20 MG tablet Take 20 mg by mouth daily.   Yes Historical Provider, MD  albuterol (PROVENTIL HFA;VENTOLIN HFA) 108 (90 Base) MCG/ACT inhaler Inhale 2 puffs into the lungs every 6 (six) hours as needed for wheezing or shortness of breath.    Historical Provider, MD  metoprolol tartrate (LOPRESSOR) 25 MG tablet Take 0.5 tablets (12.5 mg total) by mouth 2 (two) times daily. 08/30/15   Adrian SaranSital Mody, MD    Allergies as of 11/06/2015  . (No Known Allergies)    Family History  Problem Relation Age of Onset  . COPD Mother   . CAD Mother   . Alzheimer's disease Father     Social History   Social History  . Marital status: Married    Spouse name: N/A  . Number of children: N/A  . Years of education: N/A   Occupational History  . Not on file.   Social History Main Topics  . Smoking status: Never Smoker  . Smokeless tobacco: Never Used  . Alcohol use 0.6 oz/week    1 Glasses of wine per week     Comment: once a  month  . Drug use: No  . Sexual activity: Not on file   Other Topics Concern  . Not on file   Social History Narrative  . No narrative on file    Review of Systems: See HPI, otherwise negative ROS  Physical Exam: BP 136/83   Pulse 70   Temp 97.8 F (36.6 C) (Tympanic)   Resp 16   Ht 5\' 9"  (1.753 m)   Wt 78.5 kg (173 lb)   SpO2 100%   BMI 25.55 kg/m  General:   Alert,  pleasant and cooperative in NAD Head:  Normocephalic and atraumatic. Neck:  Supple; no masses or thyromegaly. Lungs:  Clear throughout to auscultation.    Heart:  Regular rate and rhythm. Abdomen:  Soft, nontender and nondistended. Normal bowel sounds, without guarding, and without rebound.   Neurologic:  Alert and  oriented x4;  grossly normal neurologically.  Impression/Plan: [name] is here for an colonoscopy to be performed for screening  Risks, benefits, limitations, and alternatives regarding  colonoscopy have been reviewed with the patient.  Questions have been answered.  All parties agreeable.   Lynnae PrudeELLIOTT, ROBERT, MD  12/04/2015, 11:22 AM

## 2015-12-04 NOTE — Anesthesia Postprocedure Evaluation (Signed)
Anesthesia Post Note  Patient: [name]  Procedure(s) Performed: Procedure(s) (LRB): COLONOSCOPY WITH PROPOFOL (N/A)  Patient location during evaluation: Endoscopy Anesthesia Type: General Level of consciousness: awake and alert Pain management: pain level controlled Vital Signs Assessment: post-procedure vital signs reviewed and stable Respiratory status: spontaneous breathing, nonlabored ventilation, respiratory function stable and patient connected to nasal cannula oxygen Cardiovascular status: blood pressure returned to baseline and stable Postop Assessment: no signs of nausea or vomiting Anesthetic complications: no    Last Vitals:  Vitals:   12/04/15 1220 12/04/15 1230  BP: 113/76 116/84  Pulse: 60 (!) 56  Resp: 13 11  Temp:      Last Pain:  Vitals:   12/04/15 1200  TempSrc: Tympanic                 Lenard SimmerAndrew Avalynn Bowe

## 2015-12-06 ENCOUNTER — Encounter: Payer: Self-pay | Admitting: Unknown Physician Specialty

## 2018-01-21 DIAGNOSIS — I10 Essential (primary) hypertension: Secondary | ICD-10-CM | POA: Diagnosis not present

## 2018-01-21 DIAGNOSIS — R739 Hyperglycemia, unspecified: Secondary | ICD-10-CM | POA: Diagnosis not present

## 2018-01-21 DIAGNOSIS — N189 Chronic kidney disease, unspecified: Secondary | ICD-10-CM | POA: Diagnosis not present

## 2018-04-22 DIAGNOSIS — I10 Essential (primary) hypertension: Secondary | ICD-10-CM | POA: Diagnosis not present

## 2018-04-22 DIAGNOSIS — N411 Chronic prostatitis: Secondary | ICD-10-CM | POA: Diagnosis not present

## 2018-04-22 DIAGNOSIS — Z760 Encounter for issue of repeat prescription: Secondary | ICD-10-CM | POA: Diagnosis not present

## 2018-04-22 DIAGNOSIS — R739 Hyperglycemia, unspecified: Secondary | ICD-10-CM | POA: Diagnosis not present

## 2018-04-22 DIAGNOSIS — R351 Nocturia: Secondary | ICD-10-CM | POA: Diagnosis not present

## 2018-04-22 DIAGNOSIS — N189 Chronic kidney disease, unspecified: Secondary | ICD-10-CM | POA: Diagnosis not present

## 2018-04-22 LAB — BASIC METABOLIC PANEL
BUN: 25 — AB (ref 4–21)
Creatinine: 1.6 — AB (ref 0.6–1.3)
Glucose: 111
Potassium: 5 (ref 3.4–5.3)
Sodium: 139 (ref 137–147)

## 2018-04-22 LAB — LIPID PANEL
Cholesterol: 167 (ref 0–200)
HDL: 37 (ref 35–70)
LDL Cholesterol: 109
Triglycerides: 109 (ref 40–160)

## 2018-04-22 LAB — HEMOGLOBIN A1C: Hemoglobin A1C: 5.9

## 2018-10-20 ENCOUNTER — Telehealth: Payer: Self-pay | Admitting: Family Medicine

## 2018-10-20 DIAGNOSIS — I10 Essential (primary) hypertension: Secondary | ICD-10-CM

## 2018-10-20 MED ORDER — METOPROLOL TARTRATE 25 MG PO TABS: 12.5000 mg | ORAL_TABLET | Freq: Two times a day (BID) | ORAL | 0 refills | Status: DC

## 2018-10-20 MED ORDER — AMLODIPINE BESYLATE 10 MG PO TABS: 10.0000 mg | ORAL_TABLET | Freq: Every day | ORAL | 0 refills | Status: DC

## 2018-10-20 NOTE — Telephone Encounter (Signed)
Patient scheduled w/ me for new patient visit 11/04/18, he requested refill BP medications for 1 week until his apt.  I received fax request from his [facility].  I agreed to fill, went ahead and sent 30 day supply of both metoprolol 25mg  half tab 12.5mg  BID and amlodipine 10mg   Nobie Putnam, DO [facility] 10/20/2018, 1:41 PM

## 2018-11-01 DIAGNOSIS — R112 Nausea with vomiting, unspecified: Secondary | ICD-10-CM | POA: Diagnosis not present

## 2018-11-01 DIAGNOSIS — N200 Calculus of kidney: Secondary | ICD-10-CM | POA: Diagnosis not present

## 2018-11-01 DIAGNOSIS — R109 Unspecified abdominal pain: Secondary | ICD-10-CM | POA: Diagnosis not present

## 2018-11-04 ENCOUNTER — Encounter: Payer: Self-pay | Admitting: Family Medicine

## 2018-11-04 ENCOUNTER — Ambulatory Visit (INDEPENDENT_AMBULATORY_CARE_PROVIDER_SITE_OTHER): Payer: BC Managed Care – PPO | Admitting: Family Medicine

## 2018-11-04 ENCOUNTER — Other Ambulatory Visit: Payer: Self-pay

## 2018-11-04 VITALS — BP 107/66 | HR 62 | Temp 98.5°F | Resp 16 | Ht 68.0 in | Wt 173.0 lb

## 2018-11-04 DIAGNOSIS — N183 Chronic kidney disease, stage 3 unspecified: Secondary | ICD-10-CM

## 2018-11-04 DIAGNOSIS — N401 Enlarged prostate with lower urinary tract symptoms: Secondary | ICD-10-CM

## 2018-11-04 DIAGNOSIS — R7303 Prediabetes: Secondary | ICD-10-CM | POA: Diagnosis not present

## 2018-11-04 DIAGNOSIS — Z7689 Persons encountering health services in other specified circumstances: Secondary | ICD-10-CM

## 2018-11-04 DIAGNOSIS — H908 Mixed conductive and sensorineural hearing loss, unspecified: Secondary | ICD-10-CM

## 2018-11-04 DIAGNOSIS — I129 Hypertensive chronic kidney disease with stage 1 through stage 4 chronic kidney disease, or unspecified chronic kidney disease: Secondary | ICD-10-CM | POA: Diagnosis not present

## 2018-11-04 DIAGNOSIS — Z87442 Personal history of urinary calculi: Secondary | ICD-10-CM

## 2018-11-04 DIAGNOSIS — R351 Nocturia: Secondary | ICD-10-CM

## 2018-11-04 NOTE — Patient Instructions (Addendum)
Thank you for coming to the office today  Restart Flomax back in evening for urination, enlarged prostate.  Take Lisinopril 10mg  once daily, do not need to take 2 pills.  We will re order meds in 3 weeks at next apt  We can consider referral to new Urologist or back to Dr Olena Heckle in future if needed.  Try OTC Metamcuil or Fiber Gummy Support  DUE for FASTING BLOOD WORK (no food or drink after midnight before the lab appointment, only water or coffee without cream/sugar on the morning of)  SCHEDULE "Lab Only" visit in the morning at the clinic for lab draw in 2 WEEKS   - Make sure Lab Only appointment is at about 1 week before your next appointment, so that results will be available  For Lab Results, once available within 2-3 days of blood draw, you can can log in to MyChart online to view your results and a brief explanation. Also, we can discuss results at next follow-up visit.   Please schedule a Follow-up Appointment to: Return in about 3 weeks (around 11/25/2018) for 3 wk follow-up HTN, kidney function lab results, kidney stone.  If you have any other questions or concerns, please feel free to call the office or send a message through [place]. You may also schedule an earlier appointment if necessary.  Additionally, you may be receiving a survey about your experience at our office within a few days to 1 week by e-mail or mail. We value your feedback.  Nobie Putnam, DO [place]

## 2018-11-04 NOTE — Progress Notes (Signed)
Subjective:    Patient ID: [name], male    DOB: 16-Oct-1955, 63 y.o.   MRN: [id number]  [name] is a 63 y.o. male presenting on 11/04/2018 for Establish Care (HTN) and Nephrolithiasis (passed 2 stones on Wednesday 11/02/2018)  Here to establish care. Previous PCP [employer].  History provided by his wife, [name] as well.  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 Recent lab from urgent care [facility], shows Cr up to 2.5 on 8/4 Current Meds - Lisinopril 10mg  daily   Reports good compliance, took meds today. Tolerating well, w/o complaints. Lifestyle: Denies CP, dyspnea, HA, edema, dizziness / lightheadedness  History of Migraine HA No more migraines, with BP controlled  BPH nocturia Reports frequent nocturia symptoms, without worsening, no other significant urinary symptoms.  Nephrolithiasis Passed kidney stones recently. Now doing better. Urology Dr Olena Heckle, no longer 16 yr ago  Pre-Diabetes Reports no concerns, prior sugar readings A1c 5.7 to 5.9 Currently on ACEi  Denies hypoglycemia, polyuria, visual changes, numbness or tingling.   Depression screen PHQ 2/9 11/04/2018  Decreased Interest 0  Down, Depressed, Hopeless 0  PHQ - 2 Score 0    Past Medical History:  Diagnosis Date  . Allergy   . Asthma   . Chronic kidney disease   . GERD (gastroesophageal reflux disease)   . Headache   . Hypertension   . TGA (transient global amnesia)    Past Surgical History:  Procedure Laterality Date  . COLONOSCOPY WITH PROPOFOL N/A 12/04/2015   Procedure: COLONOSCOPY WITH PROPOFOL;  Surgeon: Manya Silvas, MD;  Location: [facility] ENDOSCOPY;  Service: Endoscopy;  Laterality: N/A;  . LITHOTRIPSY    . URETEROSCOPY     Social History   Socioeconomic History  .  Marital status: Married    Spouse name: Not on file  . Number of children: Not on file  . Years of education: College  . Highest education level: Not on file  Occupational History  . Not on file  Social Needs  . Financial resource strain: Not on file  . Food insecurity    Worry: Not on file    Inability: Not on file  . Transportation needs    Medical: Not on file    Non-medical: Not on file  Tobacco Use  . Smoking status: Never Smoker  . Smokeless tobacco: Never Used  Substance and Sexual Activity  . Alcohol use: Yes    Alcohol/week: 2.0 standard drinks    Types: 2 Glasses of wine per week    Comment: once a month  . Drug use: No  . Sexual activity: Not on file  Lifestyle  . Physical activity    Days per week: Not on file    Minutes per session: Not on file  . Stress: Not on file  Relationships  . Social Herbalist on phone: Not on file    Gets together: Not on file    Attends religious service: Not on file    Active member of club or organization: Not on file    Attends meetings of clubs or organizations: Not on file    Relationship status: Not on file  . Intimate partner violence    Fear of current or ex partner: Not on file  Emotionally abused: Not on file    Physically abused: Not on file    Forced sexual activity: Not on file  Other Topics Concern  . Not on file  Social History Narrative  . Not on file   Family History  Problem Relation Age of Onset  . COPD Mother   . CAD Mother   . Alzheimer's disease Father    Current Outpatient Medications on File Prior to Visit  Medication Sig  . albuterol (PROVENTIL HFA;VENTOLIN HFA) 108 (90 Base) MCG/ACT inhaler Inhale 2 puffs into the lungs every 6 (six) hours as needed for wheezing or shortness of breath.  Marland Kitchen. amLODipine (NORVASC) 10 MG tablet Take 1 tablet (10 mg total) by mouth daily.  Marland Kitchen. aspirin EC 81 MG EC tablet Take 1 tablet (81 mg total) by mouth daily.  Marland Kitchen. lisinopril (ZESTRIL) 10 MG tablet Take  by mouth.  . metoprolol tartrate (LOPRESSOR) 25 MG tablet Take 0.5 tablets (12.5 mg total) by mouth 2 (two) times daily.  Marland Kitchen. omeprazole (PRILOSEC) 40 MG capsule Take by mouth.  . tamsulosin (FLOMAX) 0.4 MG CAPS capsule Take by mouth.   No current facility-administered medications on file prior to visit.     Review of Systems Per HPI unless specifically indicated above      Objective:    BP 107/66   Pulse 62   Temp 98.5 F (36.9 C) (Oral)   Resp 16   Ht 5\' 8"  (1.727 m)   Wt 173 lb (78.5 kg)   BMI 26.30 kg/m   Wt Readings from Last 3 Encounters:  11/04/18 173 lb (78.5 kg)  12/04/15 173 lb (78.5 kg)  08/29/15 174 lb 3.2 oz (79 kg)    Physical Exam Vitals signs and nursing note reviewed.  Constitutional:      General: He is not in acute distress.    Appearance: He is well-developed. He is not diaphoretic.     Comments: Well-appearing, comfortable, cooperative  HENT:     Head: Normocephalic and atraumatic.  Eyes:     General:        Right eye: No discharge.        Left eye: No discharge.     Conjunctiva/sclera: Conjunctivae normal.  Cardiovascular:     Rate and Rhythm: Normal rate.  Pulmonary:     Effort: Pulmonary effort is normal.  Skin:    General: Skin is warm and dry.     Findings: No erythema or rash.  Neurological:     Mental Status: He is alert and oriented to person, place, and time.  Psychiatric:        Behavior: Behavior normal.     Comments: Well groomed, good eye contact, normal speech and thoughts    Results for orders placed or performed in visit on 11/04/18  Basic metabolic panel  Result Value Ref Range   Glucose 111    BUN 25 (A) 4 - 21   Creatinine 1.6 (A) 0.6 - 1.3   Potassium 5.0 3.4 - 5.3   Sodium 139 137 - 147  Lipid panel  Result Value Ref Range   Triglycerides 109 40 - 160   Cholesterol 167 0 - 200   HDL 37 35 - 70   LDL Cholesterol 109   Hemoglobin A1c  Result Value Ref Range   Hemoglobin A1C 5.9       Assessment & Plan:    Problem List Items Addressed This Visit    Benign hypertension with CKD (chronic kidney disease)  stage III (HCC) - Primary    Well-controlled HTN - Home BP readings  Complication with CKD III    Plan:  1. Continue current BP regimen - Lisinopril 10mg  daily 2. Encourage improved lifestyle - low sodium diet, regular exercise 3. Start monitor BP outside office, bring readings to next visit, if persistently >140/90 or new symptoms notify office sooner 4. 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 Not on any therapy currently  2. Encourage improved lifestyle - low carb, low sugar diet, reduce portion size, continue improving regular exercise 3. Follow-up 3 weeks A1c      Relevant Orders   Hemoglobin A1c    Other Visit Diagnoses    Encounter to establish care with new doctor        Review outside medical records from prior PCP   No orders of the defined types were placed in this encounter.  #Nephrolithiasis, recently treated, passed stone, improved symptoms, on flomax, not on pain medicine. Not w/ urology, will return if needed.   Follow up plan: Return in about 3 weeks (around 11/25/2018) for 3 wk follow-up HTN, kidney function lab results, kidney stone.  Labs ordered 8/19  Saralyn PilarAlexander , DO [facility] 11/04/2018, 4:25 PM

## 2018-11-06 ENCOUNTER — Encounter: Payer: Self-pay | Admitting: Family Medicine

## 2018-11-06 DIAGNOSIS — N401 Enlarged prostate with lower urinary tract symptoms: Secondary | ICD-10-CM | POA: Insufficient documentation

## 2018-11-06 NOTE — Assessment & Plan Note (Signed)
Stable chronic BPH with nocturia On Flomax 0.4mg daily 

## 2018-11-06 NOTE — Assessment & Plan Note (Signed)
Well-controlled Pre-DM with A1c 5.7 range previously  Plan:  1. Not on any therapy currently  2. Encourage improved lifestyle - low carb, low sugar diet, reduce portion size, continue improving regular exercise 3. Follow-up 3 weeks A1c

## 2018-11-06 NOTE — Assessment & Plan Note (Addendum)
Well-controlled HTN - Home BP readings  Complication with CKD III    Plan:  1. Continue current BP regimen - Lisinopril 10mg  daily 2. Encourage improved lifestyle - low sodium diet, regular exercise 3. Start monitor BP outside office, bring readings to next visit, if persistently >140/90 or new symptoms notify office sooner 4. Follow-up 3 weeks for repeat lab

## 2018-11-06 NOTE — Assessment & Plan Note (Signed)
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

## 2018-11-16 ENCOUNTER — Other Ambulatory Visit: Payer: BC Managed Care – PPO

## 2018-11-16 ENCOUNTER — Other Ambulatory Visit: Payer: Self-pay

## 2018-11-16 DIAGNOSIS — R7303 Prediabetes: Secondary | ICD-10-CM | POA: Diagnosis not present

## 2018-11-16 DIAGNOSIS — N183 Chronic kidney disease, stage 3 unspecified: Secondary | ICD-10-CM

## 2018-11-16 DIAGNOSIS — I129 Hypertensive chronic kidney disease with stage 1 through stage 4 chronic kidney disease, or unspecified chronic kidney disease: Secondary | ICD-10-CM | POA: Diagnosis not present

## 2018-11-17 LAB — BASIC METABOLIC PANEL WITH GFR
BUN/Creatinine Ratio: 15 (calc) (ref 6–22)
BUN: 23 mg/dL (ref 7–25)
CO2: 25 mmol/L (ref 20–32)
Calcium: 8.8 mg/dL (ref 8.6–10.3)
Chloride: 106 mmol/L (ref 98–110)
Creat: 1.49 mg/dL — ABNORMAL HIGH (ref 0.70–1.25)
GFR, Est African American: 57 mL/min/{1.73_m2} — ABNORMAL LOW (ref >=60)
GFR, Est Non African American: 50 mL/min/{1.73_m2} — ABNORMAL LOW (ref >=60)
Glucose, Bld: 101 mg/dL — ABNORMAL HIGH (ref 65–99)
Potassium: 3.9 mmol/L (ref 3.5–5.3)
Sodium: 141 mmol/L (ref 135–146)

## 2018-11-17 LAB — HEMOGLOBIN A1C
Hgb A1c MFr Bld: 6.1 % of total Hgb — ABNORMAL HIGH (ref <5.7)
Mean Plasma Glucose: 128 (calc)
eAG (mmol/L): 7.1 (calc)

## 2018-11-23 ENCOUNTER — Other Ambulatory Visit: Payer: Self-pay

## 2018-11-23 ENCOUNTER — Encounter: Payer: Self-pay | Admitting: Family Medicine

## 2018-11-23 ENCOUNTER — Ambulatory Visit: Payer: BC Managed Care – PPO | Admitting: Family Medicine

## 2018-11-23 ENCOUNTER — Ambulatory Visit (INDEPENDENT_AMBULATORY_CARE_PROVIDER_SITE_OTHER): Payer: BC Managed Care – PPO | Admitting: Family Medicine

## 2018-11-23 ENCOUNTER — Other Ambulatory Visit: Payer: Self-pay | Admitting: Family Medicine

## 2018-11-23 VITALS — BP 99/53 | Ht 68.0 in | Wt 168.6 lb

## 2018-11-23 DIAGNOSIS — Z87442 Personal history of urinary calculi: Secondary | ICD-10-CM | POA: Diagnosis not present

## 2018-11-23 DIAGNOSIS — R351 Nocturia: Secondary | ICD-10-CM

## 2018-11-23 DIAGNOSIS — Z0000 Encounter for general adult medical examination without abnormal findings: Secondary | ICD-10-CM

## 2018-11-23 DIAGNOSIS — N183 Chronic kidney disease, stage 3 unspecified: Secondary | ICD-10-CM

## 2018-11-23 DIAGNOSIS — I129 Hypertensive chronic kidney disease with stage 1 through stage 4 chronic kidney disease, or unspecified chronic kidney disease: Secondary | ICD-10-CM | POA: Diagnosis not present

## 2018-11-23 DIAGNOSIS — R7303 Prediabetes: Secondary | ICD-10-CM

## 2018-11-23 DIAGNOSIS — N401 Enlarged prostate with lower urinary tract symptoms: Secondary | ICD-10-CM

## 2018-11-23 DIAGNOSIS — Z1159 Encounter for screening for other viral diseases: Secondary | ICD-10-CM

## 2018-11-23 MED ORDER — AMLODIPINE BESYLATE 5 MG PO TABS: 5.0000 mg | ORAL_TABLET | Freq: Every day | ORAL | 1 refills | Status: DC

## 2018-11-23 NOTE — Assessment & Plan Note (Signed)
Well-controlled HTN, today low normal - Home BP readings reviewed Complication with CKD III    Plan:  1. REDUCE BP med Amlodipine from 10 to 5mg  - new rx sent 2. Continue Lisinopril 10mg  daily, Metoprolol 12.5mg  BID (half of 25mg ) - decide to keep metoprolol to avoid headaches, may be taking for migraine prevention, future can consider to DC either Amlod or Metop 2. Encourage improved lifestyle - low sodium diet, regular exercise 3. Keep monitor BP outside office, bring readings to next visit, if persistently >140/90 or new symptoms notify office sooner 4. Follow-up 6 months

## 2018-11-23 NOTE — Assessment & Plan Note (Signed)
Currently asymptomatic Prior recurrent stones Likely factor with some CKD Advised when to seek care for future stone, if moderate can go to [facility] if severe can go to hospital ED  I offered to refer to Urology for more preventative approach, they decline at this time.

## 2018-11-23 NOTE — Assessment & Plan Note (Signed)
See A&P HTN Likely secondary to age, HTN, Sugar, history of nephrolithiasis Stable, maintained Cr at baseline 1.4-1.5

## 2018-11-23 NOTE — Progress Notes (Signed)
Subjective:    Patient ID: [name], male    DOB: May 28, 1955, 63 y.o.   MRN: [id number]  [name] is a 63 y.o. male presenting on 11/23/2018 for Hypertension (discuss lab results )  Patient is mostly deaf. His wife, [name], helps interpret with sign language and lip reading, and provides history.  HPI  CHRONIC HTN with CKD-III History of Nephrolithiasis History of TIA Reports prior history of creatinine 1.5 from prior review to 2015, prior lab records. Recent lab from urgent care [facility], shows Cr up to 2.5 on 8/4 Now last lab result 10/2018 - Cr 1.49 He has history of CKD in past. No chronic use NSAID. He has had recurrent kidney stones in past, some significant ones Checking BP at home - BP 110-120 / 70-80s Current Meds - Lisinopril 10mg  daily , Metoprolol 12.5mg  BID (half of 25), Amlodipine 10mg   He does admit history of migraine headaches, metoprolol and amlodipine have helped reduce headaches Reports good compliance, took meds today. Tolerating well, w/o complaints. Denies CP, dyspnea, HA, edema, dizziness / lightheadedness  Pre-Diabetes Last lab A1c 6.1 slightly elevated from previous 7 months ago at 5.9 CBGs: not checking Meds: not on med Currently on ACEi Lifestyle: - Diet (limited portions but following any particular low carb diet, admits eats some fruits high in sugar)  - Exercise (limited) Denies hypoglycemia, polyuria, visual changes, numbness or tingling.   Health Maintenance: Due for Flu Shot, will request it later in season   Depression screen PHQ 2/9 11/04/2018  Decreased Interest 0  Down, Depressed, Hopeless 0  PHQ - 2 Score 0    Social History   Tobacco Use  . Smoking status: Never Smoker  . Smokeless tobacco: Never Used  Substance Use Topics  . Alcohol use: Yes    Alcohol/week: 2.0 standard drinks    Types: 2 Glasses of wine per week    Comment: once a month  . Drug use: No    Review of Systems Per HPI unless specifically indicated  above     Objective:    BP (!) 99/53 (BP Location: Left Arm)   Ht 5\' 8"  (1.727 m)   Wt 168 lb 9.6 oz (76.5 kg)   BMI 25.64 kg/m   Wt Readings from Last 3 Encounters:  11/23/18 168 lb 9.6 oz (76.5 kg)  11/04/18 173 lb (78.5 kg)  12/04/15 173 lb (78.5 kg)    Physical Exam Vitals signs and nursing note reviewed.  Constitutional:      General: He is not in acute distress.    Appearance: He is well-developed. He is not diaphoretic.     Comments: Well-appearing, comfortable, cooperative  HENT:     Head: Normocephalic and atraumatic.     Comments: Deaf Eyes:     General:        Right eye: No discharge.        Left eye: No discharge.     Conjunctiva/sclera: Conjunctivae normal.  Neck:     Musculoskeletal: Normal range of motion and neck supple.     Thyroid: No thyromegaly.  Cardiovascular:     Rate and Rhythm: Normal rate and regular rhythm.     Heart sounds: Normal heart sounds. No murmur.  Pulmonary:     Effort: Pulmonary effort is normal. No respiratory distress.     Breath sounds: Normal breath sounds. No wheezing or rales.  Musculoskeletal: Normal range of motion.  Lymphadenopathy:     Cervical: No cervical adenopathy.  Skin:  General: Skin is warm and dry.     Findings: No erythema or rash.  Neurological:     Mental Status: He is alert and oriented to person, place, and time.  Psychiatric:        Behavior: Behavior normal.     Comments: Well groomed, good eye contact, normal speech and thoughts       Results for orders placed or performed in visit on 11/16/18  Hemoglobin A1c  Result Value Ref Range   Hgb A1c MFr Bld 6.1 (H) <5.7 % of total Hgb   Mean Plasma Glucose 128 (calc)   eAG (mmol/L) 7.1 (calc)  BASIC METABOLIC PANEL WITH GFR  Result Value Ref Range   Glucose, Bld 101 (H) 65 - 99 mg/dL   BUN 23 7 - 25 mg/dL   Creat 1.611.49 (H) 0.960.70 - 1.25 mg/dL   GFR, Est Non African American 50 (L) > OR = 60 mL/min/1.7273m2   GFR, Est African American 57 (L) > OR =  60 mL/min/1.7273m2   BUN/Creatinine Ratio 15 6 - 22 (calc)   Sodium 141 135 - 146 mmol/L   Potassium 3.9 3.5 - 5.3 mmol/L   Chloride 106 98 - 110 mmol/L   CO2 25 20 - 32 mmol/L   Calcium 8.8 8.6 - 10.3 mg/dL      Assessment & Plan:   Problem List Items Addressed This Visit    Benign hypertension with CKD (chronic kidney disease) stage III (HCC) - Primary    Well-controlled HTN, today low normal - Home BP readings reviewed Complication with CKD III    Plan:  1. REDUCE BP med Amlodipine from 10 to 5mg  - new rx sent 2. Continue Lisinopril 10mg  daily, Metoprolol 12.5mg  BID (half of 25mg ) - decide to keep metoprolol to avoid headaches, may be taking for migraine prevention, future can consider to DC either Amlod or Metop 2. Encourage improved lifestyle - low sodium diet, regular exercise 3. Keep monitor BP outside office, bring readings to next visit, if persistently >140/90 or new symptoms notify office sooner 4. Follow-up 6 months      Relevant Medications   amLODipine (NORVASC) 5 MG tablet   CKD (chronic kidney disease), stage III (HCC)    See A&P HTN Likely secondary to age, HTN, Sugar, history of nephrolithiasis Stable, maintained Cr at baseline 1.4-1.5      History of nephrolithiasis    Currently asymptomatic Prior recurrent stones Likely factor with some CKD Advised when to seek care for future stone, if moderate [facility] if severe can go to hospital ED  I offered to refer to Urology for more preventative approach, they decline at this time.      Pre-diabetes    Mild elevated A1c to 6.1, previously 5.9  Plan:  1. Not on any therapy currently  2. Encourage improved lifestyle - low carb, low sugar diet, reduce portion size, continue improving regular exercise, handout given         Meds ordered this encounter  Medications  . amLODipine (NORVASC) 5 MG tablet    Sig: Take 1 tablet (5 mg total) by mouth daily.    Dispense:  90 tablet    Refill:  1       Follow up plan: Return in about 6 months (around 05/26/2019) for Annual Physical.  Future labs ordered for 05/15/19  Saralyn PilarAlexander Karamalegos, DO [facility] 11/23/2018, 8:19 AM

## 2018-11-23 NOTE — Assessment & Plan Note (Signed)
Mild elevated A1c to 6.1, previously 5.9  Plan:  1. Not on any therapy currently  2. Encourage improved lifestyle - low carb, low sugar diet, reduce portion size, continue improving regular exercise, handout given

## 2018-11-23 NOTE — Patient Instructions (Addendum)
Thank you for coming to the office today.  For future kidney stones - Mild we can help - Moderate [facility] - Severe - Emergency Room  Let me know if you want a future referral to Urology to help for future  BP is improved. Reduce Amlodpine from 10 to 5mg  - take this new rx sent to pharmacy, see how BP is keep checking contact us if needed  Continue Metoprolol 12.5mg  twice a day. Continue on Lisinopril 10mg  daily.  Recent Labs    04/22/18 11/16/18 0800  HGBA1C 5.9 6.1*   Elevated sugar, try to reduce strach and carb in diet, and follow up in future we can re-check  DUE for FASTING BLOOD WORK (no food or drink after midnight before the lab appointment, only water or coffee without cream/sugar on the morning of)  SCHEDULE "Lab Only" visit in the morning at the clinic for lab draw in 6 MONTHS   - Make sure Lab Only appointment is at about 1 week before your next appointment, so that results will be available  For Lab Results, once available within 2-3 days of blood draw, you can can log in to MyChart online to view your results and a brief explanation. Also, we can discuss results at next follow-up visit.   Please schedule a Follow-up Appointment to: Return in about 6 months (around 05/26/2019) for Annual Physical.  If you have any other questions or concerns, please feel free to call the office or send a message through [place]. You may also schedule an earlier appointment if necessary.  Additionally, you may be receiving a survey about your experience at our office within a few days to 1 week by e-mail or mail. We value your feedback.  Nobie Putnam, DO [place]

## 2018-12-22 ENCOUNTER — Other Ambulatory Visit: Payer: Self-pay | Admitting: Family Medicine

## 2018-12-22 DIAGNOSIS — Z87442 Personal history of urinary calculi: Secondary | ICD-10-CM

## 2018-12-25 ENCOUNTER — Other Ambulatory Visit: Payer: Self-pay | Admitting: Family Medicine

## 2018-12-25 DIAGNOSIS — I129 Hypertensive chronic kidney disease with stage 1 through stage 4 chronic kidney disease, or unspecified chronic kidney disease: Secondary | ICD-10-CM

## 2018-12-25 DIAGNOSIS — I10 Essential (primary) hypertension: Secondary | ICD-10-CM

## 2018-12-25 DIAGNOSIS — N183 Chronic kidney disease, stage 3 unspecified: Secondary | ICD-10-CM

## 2019-01-19 ENCOUNTER — Other Ambulatory Visit: Payer: Self-pay | Admitting: Family Medicine

## 2019-01-19 DIAGNOSIS — N183 Chronic kidney disease, stage 3 unspecified: Secondary | ICD-10-CM

## 2019-01-19 DIAGNOSIS — I129 Hypertensive chronic kidney disease with stage 1 through stage 4 chronic kidney disease, or unspecified chronic kidney disease: Secondary | ICD-10-CM

## 2019-03-16 ENCOUNTER — Other Ambulatory Visit: Payer: Self-pay | Admitting: Family Medicine

## 2019-03-16 DIAGNOSIS — Z87442 Personal history of urinary calculi: Secondary | ICD-10-CM

## 2019-05-15 ENCOUNTER — Other Ambulatory Visit: Payer: Self-pay

## 2019-05-15 ENCOUNTER — Other Ambulatory Visit: Payer: BC Managed Care – PPO

## 2019-05-15 DIAGNOSIS — Z1159 Encounter for screening for other viral diseases: Secondary | ICD-10-CM

## 2019-05-15 DIAGNOSIS — I129 Hypertensive chronic kidney disease with stage 1 through stage 4 chronic kidney disease, or unspecified chronic kidney disease: Secondary | ICD-10-CM | POA: Diagnosis not present

## 2019-05-15 DIAGNOSIS — Z0000 Encounter for general adult medical examination without abnormal findings: Secondary | ICD-10-CM | POA: Diagnosis not present

## 2019-05-15 DIAGNOSIS — R351 Nocturia: Secondary | ICD-10-CM

## 2019-05-15 DIAGNOSIS — R7303 Prediabetes: Secondary | ICD-10-CM | POA: Diagnosis not present

## 2019-05-15 DIAGNOSIS — N401 Enlarged prostate with lower urinary tract symptoms: Secondary | ICD-10-CM

## 2019-05-15 DIAGNOSIS — N183 Chronic kidney disease, stage 3 unspecified: Secondary | ICD-10-CM

## 2019-05-16 LAB — CBC WITH DIFFERENTIAL/PLATELET
Absolute Monocytes: 530 cells/uL (ref 200–950)
Basophils Absolute: 31 cells/uL (ref 0–200)
Basophils Relative: 0.4 %
Eosinophils Absolute: 172 cells/uL (ref 15–500)
Eosinophils Relative: 2.2 %
HCT: 49.6 % (ref 38.5–50.0)
Hemoglobin: 16.8 g/dL (ref 13.2–17.1)
Lymphs Abs: 1708 cells/uL (ref 850–3900)
MCH: 30.5 pg (ref 27.0–33.0)
MCHC: 33.9 g/dL (ref 32.0–36.0)
MCV: 90.2 fL (ref 80.0–100.0)
MPV: 9.9 fL (ref 7.5–12.5)
Monocytes Relative: 6.8 %
Neutro Abs: 5359 cells/uL (ref 1500–7800)
Neutrophils Relative %: 68.7 %
Platelets: 203 10*3/uL (ref 140–400)
RBC: 5.5 10*6/uL (ref 4.20–5.80)
RDW: 11.9 % (ref 11.0–15.0)
Total Lymphocyte: 21.9 %
WBC: 7.8 10*3/uL (ref 3.8–10.8)

## 2019-05-16 LAB — COMPLETE METABOLIC PANEL WITH GFR
AG Ratio: 1.4 (calc) (ref 1.0–2.5)
ALT: 13 U/L (ref 9–46)
AST: 15 U/L (ref 10–35)
Albumin: 3.9 g/dL (ref 3.6–5.1)
Alkaline phosphatase (APISO): 90 U/L (ref 35–144)
BUN/Creatinine Ratio: 13 (calc) (ref 6–22)
BUN: 20 mg/dL (ref 7–25)
CO2: 27 mmol/L (ref 20–32)
Calcium: 9.3 mg/dL (ref 8.6–10.3)
Chloride: 105 mmol/L (ref 98–110)
Creat: 1.6 mg/dL — ABNORMAL HIGH (ref 0.70–1.25)
GFR, Est African American: 53 mL/min/{1.73_m2} — ABNORMAL LOW (ref >=60)
GFR, Est Non African American: 45 mL/min/{1.73_m2} — ABNORMAL LOW (ref >=60)
Globulin: 2.8 g/dL (calc) (ref 1.9–3.7)
Glucose, Bld: 104 mg/dL — ABNORMAL HIGH (ref 65–99)
Potassium: 4.1 mmol/L (ref 3.5–5.3)
Sodium: 141 mmol/L (ref 135–146)
Total Bilirubin: 0.4 mg/dL (ref 0.2–1.2)
Total Protein: 6.7 g/dL (ref 6.1–8.1)

## 2019-05-16 LAB — HEMOGLOBIN A1C
Hgb A1c MFr Bld: 6 % of total Hgb — ABNORMAL HIGH (ref <5.7)
Mean Plasma Glucose: 126 (calc)
eAG (mmol/L): 7 (calc)

## 2019-05-16 LAB — LIPID PANEL
Cholesterol: 155 mg/dL (ref <200)
HDL: 36 mg/dL — ABNORMAL LOW (ref >=40)
LDL Cholesterol (Calc): 95 mg/dL (calc)
Non-HDL Cholesterol (Calc): 119 mg/dL (calc) (ref <130)
Total CHOL/HDL Ratio: 4.3 (calc) (ref <5.0)
Triglycerides: 140 mg/dL (ref <150)

## 2019-05-16 LAB — HEPATITIS C ANTIBODY
Hepatitis C Ab: NONREACTIVE
SIGNAL TO CUT-OFF: 0.01 (ref <1.00)

## 2019-05-16 LAB — PSA: PSA: 2.6 ng/mL (ref <=4.0)

## 2019-05-17 ENCOUNTER — Other Ambulatory Visit: Payer: Self-pay | Admitting: Family Medicine

## 2019-05-17 DIAGNOSIS — I129 Hypertensive chronic kidney disease with stage 1 through stage 4 chronic kidney disease, or unspecified chronic kidney disease: Secondary | ICD-10-CM

## 2019-05-17 DIAGNOSIS — N183 Chronic kidney disease, stage 3 unspecified: Secondary | ICD-10-CM

## 2019-05-23 ENCOUNTER — Ambulatory Visit (INDEPENDENT_AMBULATORY_CARE_PROVIDER_SITE_OTHER): Payer: BC Managed Care – PPO | Admitting: Family Medicine

## 2019-05-23 ENCOUNTER — Other Ambulatory Visit: Payer: Self-pay

## 2019-05-23 ENCOUNTER — Other Ambulatory Visit: Payer: Self-pay | Admitting: Family Medicine

## 2019-05-23 ENCOUNTER — Encounter: Payer: Self-pay | Admitting: Family Medicine

## 2019-05-23 VITALS — BP 116/60 | HR 54 | Temp 97.1°F | Resp 16 | Ht 68.0 in | Wt 166.0 lb

## 2019-05-23 DIAGNOSIS — Z87442 Personal history of urinary calculi: Secondary | ICD-10-CM | POA: Diagnosis not present

## 2019-05-23 DIAGNOSIS — N401 Enlarged prostate with lower urinary tract symptoms: Secondary | ICD-10-CM

## 2019-05-23 DIAGNOSIS — Z0000 Encounter for general adult medical examination without abnormal findings: Secondary | ICD-10-CM | POA: Diagnosis not present

## 2019-05-23 DIAGNOSIS — N183 Chronic kidney disease, stage 3 unspecified: Secondary | ICD-10-CM

## 2019-05-23 DIAGNOSIS — I129 Hypertensive chronic kidney disease with stage 1 through stage 4 chronic kidney disease, or unspecified chronic kidney disease: Secondary | ICD-10-CM

## 2019-05-23 DIAGNOSIS — R351 Nocturia: Secondary | ICD-10-CM

## 2019-05-23 DIAGNOSIS — R7303 Prediabetes: Secondary | ICD-10-CM

## 2019-05-23 DIAGNOSIS — N1831 Chronic kidney disease, stage 3a: Secondary | ICD-10-CM

## 2019-05-23 DIAGNOSIS — I10 Essential (primary) hypertension: Secondary | ICD-10-CM | POA: Diagnosis not present

## 2019-05-23 MED ORDER — TAMSULOSIN HCL 0.4 MG PO CAPS: 0.4000 mg | ORAL_CAPSULE | Freq: Every evening | ORAL | 3 refills | Status: DC

## 2019-05-23 MED ORDER — METOPROLOL TARTRATE 25 MG PO TABS: 12.5000 mg | ORAL_TABLET | Freq: Two times a day (BID) | ORAL | 3 refills | Status: DC

## 2019-05-23 MED ORDER — LISINOPRIL 10 MG PO TABS: 10.0000 mg | ORAL_TABLET | Freq: Every day | ORAL | 3 refills | Status: DC

## 2019-05-23 NOTE — Assessment & Plan Note (Signed)
Well-controlled HTN since last visit 6 mo ago reduced amlodipine 10 to 5 - Home BP readings reviewed Complication with CKD III    Plan: 1. Continue current Amlodipine 5mg  daily, Lisinopril 10mg  daily, Metoprolol 12.5mg  BID (half of 25mg ) - decide to keep metoprolol to avoid headaches, may be taking for migraine prevention, future can consider to DC either Amlod or Metop 2. Encourage improved lifestyle - low sodium diet, regular exercise 3. Keep monitor BP outside office, bring readings to next visit, if persistently >140/90 or new symptoms notify office sooner 4. Follow-up 6 months

## 2019-05-23 NOTE — Progress Notes (Signed)
Subjective:    Patient ID: [name], male    DOB: Aug 29, 1955, 64 y.o.   MRN: [id number]  [name] is a 64 y.o. male presenting on 05/23/2019 for Annual Exam   HPI   Here for Annual Physical and Lab Reivew.  CHRONIC HTNwith CKD-III History of Nephrolithiasis History of TIA Recent lab result Creatinine 1.6, consistent with prior range 1.4 to 1.6 He has history of CKD in past. No chronic use NSAID. He has had recurrent kidney stones in past, some significant ones - Last visit his Amlodipine was reduced from 10 to 5mg  (back in 10/2018) Checking BP at home - BP 110-120 / 70-80s, still very well controlled. Current Meds -Lisinopril 10mg  daily, Metoprolol 12.5mg  BID (half of 25), Amlodipine 5mg  He does admit history of migraine headaches, metoprolol and amlodipine have helped reduce headaches Reports good compliance, took meds today. Tolerating well, w/o complaints. Denies CP, dyspnea, HA, edema, dizziness / lightheadedness  Pre-Diabetes Last lab A1c 6.0 (previously 6.1). CBGs: not checking Meds: not on med Currently on ACEi Lifestyle: - Diet (limited portions but has improved lower carb options. Increased fish and chicken, inc vegetables, stopped soda, sweet tea. Has 1 coffee in AM only) - Exercise (limited) Denies hypoglycemia, polyuria, visual changes, numbness or tingling.  HYPERLIPIDEMIA: - Reports no concerns. Last lipid panel 05/2019, improved lipids with controlled. - Not on statin.  History of Migraine HA No more migraines, with BP controlled  BPH nocturia Reports frequent nocturia symptoms, without worsening, no other significant urinary symptoms. Taking Tamsulosin 0.4mg  nightly  PMH Nephrolithiasis  Health Maintenance: Prostate CA Screening: Prior PSA / DRE reported normal. Last PSA 2.6 (05/2019). Currently mild BPH LUTS improved on flomax. No known family history of prostate CA.   UTD Colon cancer screening  Due for Flu Shot, declines today despite  counseling on benefits  Declines COVID19 vaccine for future   Depression screen [facility] 2/9 05/23/2019 11/04/2018  Decreased Interest 0 0  Down, Depressed, Hopeless 0 0  PHQ - 2 Score 0 0    Past Medical History:  Diagnosis Date  . Allergy   . Asthma   . Chronic kidney disease   . GERD (gastroesophageal reflux disease)   . Headache   . Hypertension   . TGA (transient global amnesia)    Past Surgical History:  Procedure Laterality Date  . COLONOSCOPY WITH PROPOFOL N/A 12/04/2015   Procedure: COLONOSCOPY WITH PROPOFOL;  Surgeon: Scot Jun, MD;  Location: [facility] ENDOSCOPY;  Service: Endoscopy;  Laterality: N/A;  . LITHOTRIPSY    . URETEROSCOPY     Social History   Socioeconomic History  . Marital status: Married    Spouse name: Not on file  . Number of children: Not on file  . Years of education: College  . Highest education level: Not on file  Occupational History  . Not on file  Tobacco Use  . Smoking status: Never Smoker  . Smokeless tobacco: Never Used  Substance and Sexual Activity  . Alcohol use: Yes    Alcohol/week: 2.0 standard drinks    Types: 2 Glasses of wine per week    Comment: once a month  . Drug use: No  . Sexual activity: Not on file  Other Topics Concern  . Not on file  Social History Narrative  . Not on file   Social Determinants of Health   Financial Resource Strain:   . Difficulty of Paying Living Expenses: Not on file  Food Insecurity:   .  Worried About Programme researcher, broadcasting/film/video in the Last Year: Not on file  . Ran Out of Food in the Last Year: Not on file  Transportation Needs:   . Lack of Transportation (Medical): Not on file  . Lack of Transportation (Non-Medical): Not on file  Physical Activity:   . Days of Exercise per Week: Not on file  . Minutes of Exercise per Session: Not on file  Stress:   . Feeling of Stress : Not on file  Social Connections:   . Frequency of Communication with Friends and Family: Not on file  . Frequency of  Social Gatherings with Friends and Family: Not on file  . Attends Religious Services: Not on file  . Active Member of Clubs or Organizations: Not on file  . Attends Banker Meetings: Not on file  . Marital Status: Not on file  Intimate Partner Violence:   . Fear of Current or Ex-Partner: Not on file  . Emotionally Abused: Not on file  . Physically Abused: Not on file  . Sexually Abused: Not on file   Family History  Problem Relation Age of Onset  . COPD Mother   . CAD Mother   . Alzheimer's disease Father   . Diabetes Father    Current Outpatient Medications on File Prior to Visit  Medication Sig  . amLODipine (NORVASC) 5 MG tablet TAKE 1 TABLET BY MOUTH EVERY DAY  . aspirin EC 81 MG EC tablet Take 1 tablet (81 mg total) by mouth daily.  Marland Kitchen omeprazole (PRILOSEC) 40 MG capsule Take by mouth.  Marland Kitchen albuterol (PROVENTIL HFA;VENTOLIN HFA) 108 (90 Base) MCG/ACT inhaler Inhale 2 puffs into the lungs every 6 (six) hours as needed for wheezing or shortness of breath.   No current facility-administered medications on file prior to visit.    Review of Systems  Constitutional: Negative for activity change, appetite change, chills, diaphoresis, fatigue and fever.  HENT: Positive for hearing loss. Negative for congestion.   Eyes: Negative for visual disturbance.  Respiratory: Negative for cough, chest tightness, shortness of breath and wheezing.   Cardiovascular: Negative for chest pain, palpitations and leg swelling.  Gastrointestinal: Negative for abdominal pain, blood in stool, constipation, diarrhea, nausea and vomiting.  Endocrine: Negative for cold intolerance.  Genitourinary: Negative for dysuria, frequency and hematuria.  Musculoskeletal: Negative for arthralgias, back pain and neck pain.  Skin: Negative for rash.  Allergic/Immunologic: Negative for environmental allergies.  Neurological: Negative for dizziness, weakness, light-headedness, numbness and headaches.    Hematological: Negative for adenopathy.  Psychiatric/Behavioral: Negative for behavioral problems, dysphoric mood and sleep disturbance. The patient is not nervous/anxious.    Per HPI unless specifically indicated above     Objective:    BP 116/60   Pulse (!) 54   Temp (!) 97.1 F (36.2 C) (Temporal)   Resp 16   Ht 5\' 8"  (1.727 m)   Wt 166 lb (75.3 kg)   SpO2 99%   BMI 25.24 kg/m   Wt Readings from Last 3 Encounters:  05/23/19 166 lb (75.3 kg)  11/23/18 168 lb 9.6 oz (76.5 kg)  11/04/18 173 lb (78.5 kg)    Physical Exam Vitals and nursing note reviewed.  Constitutional:      General: He is not in acute distress.    Appearance: He is well-developed. He is not diaphoretic.     Comments: Well-appearing, comfortable, cooperative  HENT:     Head: Normocephalic and atraumatic.     Ears:  Comments: Deaf Eyes:     General:        Right eye: No discharge.        Left eye: No discharge.     Conjunctiva/sclera: Conjunctivae normal.     Pupils: Pupils are equal, round, and reactive to light.  Neck:     Thyroid: No thyromegaly.  Cardiovascular:     Rate and Rhythm: Normal rate and regular rhythm.     Heart sounds: Normal heart sounds. No murmur.  Pulmonary:     Effort: Pulmonary effort is normal. No respiratory distress.     Breath sounds: Normal breath sounds. No wheezing or rales.  Abdominal:     General: Bowel sounds are normal. There is no distension.     Palpations: Abdomen is soft. There is no mass.     Tenderness: There is no abdominal tenderness.  Musculoskeletal:        General: No tenderness. Normal range of motion.     Cervical back: Normal range of motion and neck supple.     Comments: Upper / Lower Extremities: - Normal muscle tone, strength bilateral upper extremities 5/5, lower extremities 5/5  Lymphadenopathy:     Cervical: No cervical adenopathy.  Skin:    General: Skin is warm and dry.     Findings: No erythema or rash.  Neurological:     Mental  Status: He is alert and oriented to person, place, and time.     Comments: Distal sensation intact to light touch all extremities  Psychiatric:        Behavior: Behavior normal.     Comments: Well groomed, good eye contact, normal speech and thoughts    Results for orders placed or performed in visit on 05/15/19  Hepatitis C antibody  Result Value Ref Range   Hepatitis C Ab NON-REACTIVE NON-REACTI   SIGNAL TO CUT-OFF 0.01 <1.00  PSA  Result Value Ref Range   PSA 2.6 < OR = 4.0 ng/mL  Lipid panel  Result Value Ref Range   Cholesterol 155 <200 mg/dL   HDL 36 (L) > OR = 40 mg/dL   Triglycerides 518 <841 mg/dL   LDL Cholesterol (Calc) 95 mg/dL (calc)   Total CHOL/HDL Ratio 4.3 <5.0 (calc)   Non-HDL Cholesterol (Calc) 119 <130 mg/dL (calc)  COMPLETE METABOLIC PANEL WITH GFR  Result Value Ref Range   Glucose, Bld 104 (H) 65 - 99 mg/dL   BUN 20 7 - 25 mg/dL   Creat 6.60 (H) 6.30 - 1.25 mg/dL   GFR, Est Non African American 45 (L) > OR = 60 mL/min/1.14m2   GFR, Est African American 53 (L) > OR = 60 mL/min/1.29m2   BUN/Creatinine Ratio 13 6 - 22 (calc)   Sodium 141 135 - 146 mmol/L   Potassium 4.1 3.5 - 5.3 mmol/L   Chloride 105 98 - 110 mmol/L   CO2 27 20 - 32 mmol/L   Calcium 9.3 8.6 - 10.3 mg/dL   Total Protein 6.7 6.1 - 8.1 g/dL   Albumin 3.9 3.6 - 5.1 g/dL   Globulin 2.8 1.9 - 3.7 g/dL (calc)   AG Ratio 1.4 1.0 - 2.5 (calc)   Total Bilirubin 0.4 0.2 - 1.2 mg/dL   Alkaline phosphatase (APISO) 90 35 - 144 U/L   AST 15 10 - 35 U/L   ALT 13 9 - 46 U/L  CBC with Differential/Platelet  Result Value Ref Range   WBC 7.8 3.8 - 10.8 Thousand/uL   RBC 5.50 4.20 -  5.80 Million/uL   Hemoglobin 16.8 13.2 - 17.1 g/dL   HCT 35.5 97.4 - 16.3 %   MCV 90.2 80.0 - 100.0 fL   MCH 30.5 27.0 - 33.0 pg   MCHC 33.9 32.0 - 36.0 g/dL   RDW 84.5 36.4 - 68.0 %   Platelets 203 140 - 400 Thousand/uL   MPV 9.9 7.5 - 12.5 fL   Neutro Abs 5,359 1,500 - 7,800 cells/uL   Lymphs Abs 1,708 850 - 3,900  cells/uL   Absolute Monocytes 530 200 - 950 cells/uL   Eosinophils Absolute 172 15 - 500 cells/uL   Basophils Absolute 31 0 - 200 cells/uL   Neutrophils Relative % 68.7 %   Total Lymphocyte 21.9 %   Monocytes Relative 6.8 %   Eosinophils Relative 2.2 %   Basophils Relative 0.4 %  Hemoglobin A1c  Result Value Ref Range   Hgb A1c MFr Bld 6.0 (H) <5.7 % of total Hgb   Mean Plasma Glucose 126 (calc)   eAG (mmol/L) 7.0 (calc)      Assessment & Plan:   Problem List Items Addressed This Visit    Pre-diabetes    Stable A1c at 6.0, previously 5.9 to 6.1 range  Plan:  1. Not on any therapy currently  2. Encourage improved lifestyle - low carb, low sugar diet, reduce portion size, continue improving regular exercise, handout given      History of nephrolithiasis   Relevant Medications   tamsulosin (FLOMAX) 0.4 MG CAPS capsule   CKD (chronic kidney disease), stage III    See A&P HTN Likely secondary to age, HTN, Sugar, history of nephrolithiasis Stable, maintained Cr at baseline 1.4-1.6      Benign prostatic hyperplasia with nocturia    Stable chronic BPH with nocturia On Flomax 0.4mg  daily      Benign hypertension with CKD (chronic kidney disease) stage III    Well-controlled HTN since last visit 6 mo ago reduced amlodipine 10 to 5 - Home BP readings reviewed Complication with CKD III    Plan: 1. Continue current Amlodipine 5mg  daily, Lisinopril 10mg  daily, Metoprolol 12.5mg  BID (half of 25mg ) - decide to keep metoprolol to avoid headaches, may be taking for migraine prevention, future can consider to DC either Amlod or Metop 2. Encourage improved lifestyle - low sodium diet, regular exercise 3. Keep monitor BP outside office, bring readings to next visit, if persistently >140/90 or new symptoms notify office sooner 4. Follow-up 6 months      Relevant Medications   metoprolol tartrate (LOPRESSOR) 25 MG tablet   lisinopril (ZESTRIL) 10 MG tablet    Other Visit  Diagnoses    Annual physical exam    -  Primary   Essential hypertension       Relevant Medications   metoprolol tartrate (LOPRESSOR) 25 MG tablet   lisinopril (ZESTRIL) 10 MG tablet      Updated Health Maintenance information Reviewed recent lab results with patient Encouraged improvement to lifestyle with diet and exercise - Goal of weight loss   Meds ordered this encounter  Medications  . tamsulosin (FLOMAX) 0.4 MG CAPS capsule    Sig: Take 1 capsule (0.4 mg total) by mouth every evening.    Dispense:  90 capsule    Refill:  3  . metoprolol tartrate (LOPRESSOR) 25 MG tablet    Sig: Take 0.5 tablets (12.5 mg total) by mouth 2 (two) times daily.    Dispense:  90 tablet    Refill:  3  DX Code I12.9, keep refills on file unless ready.  Marland Kitchen lisinopril (ZESTRIL) 10 MG tablet    Sig: Take 1 tablet (10 mg total) by mouth daily.    Dispense:  90 tablet    Refill:  3    DX Code I12.9, keep on file until refills ready      Follow up plan: Return in about 6 months (around 11/20/2019) for 6 month follow-up HTN CKD PreDM.   Future labs ordered for 11/14/19  Nobie Putnam, [facility] 05/23/2019, 9:16 AM

## 2019-05-23 NOTE — Assessment & Plan Note (Signed)
Stable chronic BPH with nocturia On Flomax 0.4mg  daily

## 2019-05-23 NOTE — Assessment & Plan Note (Signed)
Stable A1c at 6.0, previously 5.9 to 6.1 range  Plan:  1. Not on any therapy currently  2. Encourage improved lifestyle - low carb, low sugar diet, reduce portion size, continue improving regular exercise, handout given

## 2019-05-23 NOTE — Patient Instructions (Addendum)
Thank you for coming to the office today.  Keep up great work  BP looks great. Keep checking it.  Kidney function is stable. Will re-check in 6 months.  Blood sugar is also stable, will recheck in 6 months. Keep on improving diet as you are.   DUE for FASTING BLOOD WORK (no food or drink after midnight before the lab appointment, only water or coffee without cream/sugar on the morning of)  SCHEDULE "Lab Only" visit in the morning at the clinic for lab draw in 6 MONTHS   - Make sure Lab Only appointment is at about 1 week before your next appointment, so that results will be available  For Lab Results, once available within 2-3 days of blood draw, you can can log in to MyChart online to view your results and a brief explanation. Also, we can discuss results at next follow-up visit.   Please schedule a Follow-up Appointment to: Return in about 6 months (around 11/20/2019) for 6 month follow-up HTN CKD PreDM.  If you have any other questions or concerns, please feel free to call the office or send a message through MyChart. You may also schedule an earlier appointment if necessary.  Additionally, you may be receiving a survey about your experience at our office within a few days to 1 week by e-mail or mail. We value your feedback.  Saralyn Pilar, DO [facility], [place]

## 2019-05-23 NOTE — Assessment & Plan Note (Signed)
See A&P HTN Likely secondary to age, HTN, Sugar, history of nephrolithiasis Stable, maintained Cr at baseline 1.4-1.6

## 2019-07-24 ENCOUNTER — Encounter: Payer: BC Managed Care – PPO | Admitting: Family Medicine

## 2019-11-14 ENCOUNTER — Other Ambulatory Visit: Payer: BC Managed Care – PPO

## 2019-11-14 ENCOUNTER — Other Ambulatory Visit: Payer: Self-pay

## 2019-11-14 DIAGNOSIS — N1831 Chronic kidney disease, stage 3a: Secondary | ICD-10-CM

## 2019-11-14 DIAGNOSIS — N183 Chronic kidney disease, stage 3 unspecified: Secondary | ICD-10-CM | POA: Diagnosis not present

## 2019-11-14 DIAGNOSIS — I129 Hypertensive chronic kidney disease with stage 1 through stage 4 chronic kidney disease, or unspecified chronic kidney disease: Secondary | ICD-10-CM | POA: Diagnosis not present

## 2019-11-14 DIAGNOSIS — R7303 Prediabetes: Secondary | ICD-10-CM

## 2019-11-15 LAB — BASIC METABOLIC PANEL WITH GFR
BUN/Creatinine Ratio: 18 (calc) (ref 6–22)
BUN: 30 mg/dL — ABNORMAL HIGH (ref 7–25)
CO2: 31 mmol/L (ref 20–32)
Calcium: 9.3 mg/dL (ref 8.6–10.3)
Chloride: 104 mmol/L (ref 98–110)
Creat: 1.64 mg/dL — ABNORMAL HIGH (ref 0.70–1.25)
GFR, Est African American: 51 mL/min/{1.73_m2} — ABNORMAL LOW (ref >=60)
GFR, Est Non African American: 44 mL/min/{1.73_m2} — ABNORMAL LOW (ref >=60)
Glucose, Bld: 102 mg/dL — ABNORMAL HIGH (ref 65–99)
Potassium: 4.3 mmol/L (ref 3.5–5.3)
Sodium: 140 mmol/L (ref 135–146)

## 2019-11-15 LAB — HEMOGLOBIN A1C
Hgb A1c MFr Bld: 5.8 % of total Hgb — ABNORMAL HIGH (ref <5.7)
Mean Plasma Glucose: 120 (calc)
eAG (mmol/L): 6.6 (calc)

## 2019-11-16 ENCOUNTER — Other Ambulatory Visit: Payer: Self-pay | Admitting: Family Medicine

## 2019-11-16 DIAGNOSIS — I129 Hypertensive chronic kidney disease with stage 1 through stage 4 chronic kidney disease, or unspecified chronic kidney disease: Secondary | ICD-10-CM

## 2019-11-16 DIAGNOSIS — N183 Chronic kidney disease, stage 3 unspecified: Secondary | ICD-10-CM

## 2019-11-16 NOTE — Telephone Encounter (Signed)
Requested Prescriptions  Pending Prescriptions Disp Refills   amLODipine (NORVASC) 5 MG tablet [Pharmacy Med Name: AMLODIPINE BESYLATE 5 MG TAB] 90 tablet 0    Sig: TAKE 1 TABLET BY MOUTH EVERY DAY     Cardiovascular:  Calcium Channel Blockers Passed - 11/16/2019  1:51 AM      Passed - Last BP in normal range    BP Readings from Last 1 Encounters:  05/23/19 116/60         Passed - Valid encounter within last 6 months    Recent Outpatient Visits          5 months ago Annual physical exam   [facility] Smitty Cords, DO   11 months ago Benign hypertension with CKD (chronic kidney disease) stage III [facility])   [facility] Smitty Cords, DO   1 year ago Benign hypertension with CKD (chronic kidney disease) stage III [facility])   [facility] Althea Charon, Netta Neat, DO      Future Appointments            In 4 days Althea Charon, Netta Neat, DO [facility], [facility]

## 2019-11-20 ENCOUNTER — Ambulatory Visit: Payer: BC Managed Care – PPO | Admitting: Family Medicine

## 2019-11-24 ENCOUNTER — Ambulatory Visit (INDEPENDENT_AMBULATORY_CARE_PROVIDER_SITE_OTHER): Payer: BC Managed Care – PPO | Admitting: Family Medicine

## 2019-11-24 ENCOUNTER — Other Ambulatory Visit: Payer: Self-pay | Admitting: Family Medicine

## 2019-11-24 ENCOUNTER — Encounter: Payer: Self-pay | Admitting: Family Medicine

## 2019-11-24 ENCOUNTER — Other Ambulatory Visit: Payer: Self-pay

## 2019-11-24 VITALS — BP 113/65 | HR 60 | Temp 97.1°F | Resp 16 | Ht 68.0 in | Wt 168.0 lb

## 2019-11-24 DIAGNOSIS — K219 Gastro-esophageal reflux disease without esophagitis: Secondary | ICD-10-CM | POA: Insufficient documentation

## 2019-11-24 DIAGNOSIS — R351 Nocturia: Secondary | ICD-10-CM

## 2019-11-24 DIAGNOSIS — R7303 Prediabetes: Secondary | ICD-10-CM | POA: Diagnosis not present

## 2019-11-24 DIAGNOSIS — N183 Chronic kidney disease, stage 3 unspecified: Secondary | ICD-10-CM

## 2019-11-24 DIAGNOSIS — N1831 Chronic kidney disease, stage 3a: Secondary | ICD-10-CM

## 2019-11-24 DIAGNOSIS — N401 Enlarged prostate with lower urinary tract symptoms: Secondary | ICD-10-CM

## 2019-11-24 DIAGNOSIS — I129 Hypertensive chronic kidney disease with stage 1 through stage 4 chronic kidney disease, or unspecified chronic kidney disease: Secondary | ICD-10-CM | POA: Diagnosis not present

## 2019-11-24 DIAGNOSIS — H908 Mixed conductive and sensorineural hearing loss, unspecified: Secondary | ICD-10-CM

## 2019-11-24 DIAGNOSIS — Z0000 Encounter for general adult medical examination without abnormal findings: Secondary | ICD-10-CM

## 2019-11-24 MED ORDER — AMLODIPINE BESYLATE 5 MG PO TABS: 5.0000 mg | ORAL_TABLET | Freq: Every day | ORAL | 1 refills | Status: DC

## 2019-11-24 MED ORDER — OMEPRAZOLE 20 MG PO CPDR: 20.0000 mg | DELAYED_RELEASE_CAPSULE | Freq: Every day | ORAL | 3 refills | Status: DC

## 2019-11-24 NOTE — Assessment & Plan Note (Signed)
Improved A1c 5.8 now Prior range 5.9 to 6.1  Plan:  1. Not on any therapy currently  2. Encourage improved lifestyle - low carb, low sugar diet, reduce portion size, continue improving regular exercise, handout given

## 2019-11-24 NOTE — Assessment & Plan Note (Signed)
Chronic problem Nerve deafness

## 2019-11-24 NOTE — Assessment & Plan Note (Signed)
Stable chronic BPH with nocturia - IMPROVED On Flomax 0.4mg  daily

## 2019-11-24 NOTE — Patient Instructions (Addendum)
Thank you for coming to the office today.  Recommend COVID vaccine.  Keep on current BP meds  Sent re order for Amlodipine for refills, pick up when ready.  Ordered Omeprazole 20mg  daily - generic rx, hopefully it will be covered. Otherwise may be  Same price.    DUE for FASTING BLOOD WORK (no food or drink after midnight before the lab appointment, only water or coffee without cream/sugar on the morning of)  SCHEDULE "Lab Only" visit in the morning at the clinic for lab draw in 6 MONTHS   - Make sure Lab Only appointment is at about 1 week before your next appointment, so that results will be available  For Lab Results, once available within 2-3 days of blood draw, you can can log in to MyChart online to view your results and a brief explanation. Also, we can discuss results at next follow-up visit.     Please schedule a Follow-up Appointment to: Return in about 6 months (around 05/26/2020) for 6 months fasting lab only then 1 week later Annual Physical.  If you have any other questions or concerns, please feel free to call the office or send a message through MyChart. You may also schedule an earlier appointment if necessary.  Additionally, you may be receiving a survey about your experience at our office within a few days to 1 week by e-mail or mail. We value your feedback.  05/28/2020, DO [facility], [facility]

## 2019-11-24 NOTE — Assessment & Plan Note (Signed)
Controlled on PPI long term Trial on generic rx omeprazole to see if covered

## 2019-11-24 NOTE — Progress Notes (Signed)
Subjective:    Patient ID: [name], male    DOB: 1955/12/30, 64 y.o.   MRN: [id number]  [name] is a 64 y.o. male presenting on 11/24/2019 for Hypertension   HPI   CHRONIC HTNwith CKD-III History of Nephrolithiasis History of TIA Recent lab result Creatinine 1.64, still stable and consistent with prior range 1.4 to 1.6 He has history of CKD in past. No chronic use NSAID. He has had recurrent kidney stones in past, some significant ones - Last year 2020, Amlodipine was reduced from 10 to 5mg , he has done well. Checking BP at home - BP 110-120 / 70-80s, still very well controlled - similar to readings last visit. Current Meds -Lisinopril 10mg  daily, Metoprolol 12.5mg  BID (half of 25), Amlodipine 5mg  He does admit history of migraine headaches, metoprolol and amlodipine have helped reduce headaches - no further recurrent headaches Reports good compliance, took meds today. Tolerating well, w/o complaints. Denies CP, dyspnea, HA, edema, dizziness / lightheadedness  Pre-Diabetes Prior range 6.0 to 6.1, now last lab down to 5.8 with improved diet. CBGs:not checking Meds:not on med Currently on ACEi Lifestyle: - Diet (limited portions but has improved lower carb options. Increased chicken, less red meat, inc vegetables, stopped soda, sweet tea. Has 1 coffee in AM only) - Exercise (limited) Denies hypoglycemia, polyuria, visual changes, numbness or tingling.  GERD Chronic GERD heartburn, controlled on PPI Had lowered from 40 to 20. Now taking OTC Prilosec Request generic order  History of Migraine HA No more migraines, with BP controlled  BPH nocturia Reports frequent nocturia symptoms, without worsening, no other significant urinary symptoms. Taking Tamsulosin 0.4mg  nightly Improved significantly with less nocturia  PMH Nephrolithiasis  Health Maintenance:  Due for Flu Shot, declines today despite counseling on benefits  Declines COVID19 vaccine for  future   Depression screen [facility] 2/9 11/24/2019 11/24/2019 05/23/2019  Decreased Interest 0 0 0  Down, Depressed, Hopeless 0 0 0  PHQ - 2 Score 0 0 0    Social History   Tobacco Use  . Smoking status: Never Smoker  . Smokeless tobacco: Never Used  Vaping Use  . Vaping Use: Never used  Substance Use Topics  . Alcohol use: Yes    Alcohol/week: 2.0 standard drinks    Types: 2 Glasses of wine per week    Comment: once a month  . Drug use: No    Review of Systems Per HPI unless specifically indicated above     Objective:    BP 113/65   Pulse 60   Temp (!) 97.1 F (36.2 C) (Temporal)   Resp 16   Ht 5\' 8"  (1.727 m)   Wt 168 lb (76.2 kg)   SpO2 100%   BMI 25.54 kg/m   Wt Readings from Last 3 Encounters:  11/24/19 168 lb (76.2 kg)  05/23/19 166 lb (75.3 kg)  11/23/18 168 lb 9.6 oz (76.5 kg)    Physical Exam    Results for orders placed or performed in visit on 11/14/19  BASIC METABOLIC PANEL WITH GFR  Result Value Ref Range   Glucose, Bld 102 (H) 65 - 99 mg/dL   BUN 30 (H) 7 - 25 mg/dL   Creat 11/26/19 (H) 05/25/19 - 1.25 mg/dL   GFR, Est Non African American 44 (L) > OR = 60 mL/min/1.38m2   GFR, Est African American 51 (L) > OR = 60 mL/min/1.59m2   BUN/Creatinine Ratio 18 6 - 22 (calc)   Sodium 140 135 -  146 mmol/L   Potassium 4.3 3.5 - 5.3 mmol/L   Chloride 104 98 - 110 mmol/L   CO2 31 20 - 32 mmol/L   Calcium 9.3 8.6 - 10.3 mg/dL  Hemoglobin N2D  Result Value Ref Range   Hgb A1c MFr Bld 5.8 (H) <5.7 % of total Hgb   Mean Plasma Glucose 120 (calc)   eAG (mmol/L) 6.6 (calc)      Assessment & Plan:   Problem List Items Addressed This Visit    Pre-diabetes    Improved A1c 5.8 now Prior range 5.9 to 6.1  Plan:  1. Not on any therapy currently  2. 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       Gastroesophageal reflux disease    Controlled on PPI long term Trial on generic rx omeprazole to see if covered      Relevant Medications   omeprazole (PRILOSEC) 20 MG capsule   CKD (chronic kidney disease), stage III    See A&P HTN Likely secondary to age, HTN, Sugar, history of nephrolithiasis Stable, last lab Cr 1.64 maintained Cr at baseline 1.4-1.6      Benign prostatic hyperplasia with nocturia    Stable chronic BPH with nocturia - IMPROVED On Flomax 0.4mg  daily      Benign hypertension with CKD (chronic kidney disease) stage III - Primary    Well-controlled HTN - Home BP readings reviewed Complication with CKD III Previously Amlodipine 10mg     Plan: 1. Continue current Amlodipine 5mg  daily, Lisinopril 10mg  daily, Metoprolol 12.5mg  BID (half of 25mg ) - decide to keep metoprolol to avoid headaches, may be taking for migraine prevention, future can consider to DC either Amlod or Metop 2. Encourage improved lifestyle - low sodium diet, regular exercise 3. Keep monitor BP outside office, bring readings to next visit, if persistently >140/90 or new symptoms notify office sooner      Relevant Medications   amLODipine (NORVASC) 5 MG tablet      Meds ordered this encounter  Medications  . amLODipine (NORVASC) 5 MG tablet    Sig: Take 1 tablet (5 mg total) by mouth daily.    Dispense:  90 tablet    Refill:  1    Add refill on file, recently filled but was out of refills  . omeprazole (PRILOSEC) 20 MG capsule    Sig: Take 1 capsule (20 mg total) by mouth daily before breakfast.    Dispense:  90 capsule    Refill:  3     Follow up plan: Return in about 6 months (around 05/26/2020) for 6 months fasting lab only then 1 week later Annual Physical.  Future labs ordered for 05/24/19   , DO [facility] 11/24/2019, 8:19 AM

## 2019-11-24 NOTE — Telephone Encounter (Signed)
Requested medication (s) are due for refill today: No  Requested medication (s) are on the active medication list: No  Last refill:  refill today for Omeprazol  Future visit scheduled: yes  Notes to clinic: Pharmacy requesting alternative Prior authorization needed     Requested Prescriptions  Pending Prescriptions Disp Refills   esomeprazole (NEXIUM) 20 MG capsule [Pharmacy Med Name: ESOMEPRAZOLE MAG DR 20 MG CAP]  0      Gastroenterology: Proton Pump Inhibitors Passed - 11/24/2019  8:52 AM      Passed - Valid encounter within last 12 months    Recent Outpatient Visits           Today Benign hypertension with CKD (chronic kidney disease) stage III   [facility] [place], Netta Neat, DO   6 months ago Annual physical exam   [facility] Smitty Cords, DO   1 year ago Benign hypertension with CKD (chronic kidney disease) stage III [facility])   [facility] Smitty Cords, DO   1 year ago Benign hypertension with CKD (chronic kidney disease) stage III [facility])   [facility] Althea Charon, Netta Neat, DO       Future Appointments             In 6 months Althea Charon, Netta Neat, DO [facility], [facility]

## 2019-11-24 NOTE — Assessment & Plan Note (Addendum)
See A&P HTN Likely secondary to age, HTN, Sugar, history of nephrolithiasis Stable, last lab Cr 1.64 maintained Cr at baseline 1.4-1.6

## 2019-11-24 NOTE — Assessment & Plan Note (Signed)
Well-controlled HTN - Home BP readings reviewed Complication with CKD III Previously Amlodipine 10mg     Plan: 1. Continue current Amlodipine 5mg  daily, Lisinopril 10mg  daily, Metoprolol 12.5mg  BID (half of 25mg ) - decide to keep metoprolol to avoid headaches, may be taking for migraine prevention, future can consider to DC either Amlod or Metop 2. Encourage improved lifestyle - low sodium diet, regular exercise 3. Keep monitor BP outside office, bring readings to next visit, if persistently >140/90 or new symptoms notify office sooner

## 2020-03-04 ENCOUNTER — Telehealth (INDEPENDENT_AMBULATORY_CARE_PROVIDER_SITE_OTHER): Payer: BC Managed Care – PPO | Admitting: Family Medicine

## 2020-03-04 ENCOUNTER — Other Ambulatory Visit: Payer: Self-pay

## 2020-03-04 ENCOUNTER — Encounter: Payer: Self-pay | Admitting: Family Medicine

## 2020-03-04 DIAGNOSIS — J0110 Acute frontal sinusitis, unspecified: Secondary | ICD-10-CM

## 2020-03-04 DIAGNOSIS — Z20822 Contact with and (suspected) exposure to covid-19: Secondary | ICD-10-CM | POA: Diagnosis not present

## 2020-03-04 DIAGNOSIS — J9801 Acute bronchospasm: Secondary | ICD-10-CM

## 2020-03-04 MED ORDER — ALBUTEROL SULFATE HFA 108 (90 BASE) MCG/ACT IN AERS: 2.0000 | INHALATION_SPRAY | Freq: Four times a day (QID) | RESPIRATORY_TRACT | 2 refills | Status: DC | PRN

## 2020-03-04 MED ORDER — IPRATROPIUM BROMIDE 0.06 % NA SOLN: 2.0000 | Freq: Four times a day (QID) | NASAL | 0 refills | Status: DC

## 2020-03-04 NOTE — Progress Notes (Signed)
Subjective:    Patient ID: [name], male    DOB: Oct 02, 1955, 64 y.o.   MRN: [id number]  [name] is a 64 y.o. male presenting on 03/04/2020 for Nasal Congestion (cough, SOB, chills, low grade fever onset several days but getting worst from past two days )  Virtual / Telehealth Encounter - Video Visit via MyChart The purpose of this virtual visit is to provide medical care while limiting exposure to the novel coronavirus (COVID19) for both patient and office staff.  Consent was obtained for remote visit:  Yes.   Answered questions that patient had about telehealth interaction:  Yes.   I discussed the limitations, risks, security and privacy concerns of performing an evaluation and management service by video/telephone. I also discussed with the patient that there may be a patient responsible charge related to this service. The patient expressed understanding and agreed to proceed.  Patient Location: Home Provider Location: [facility] (Office)  Participants in virtual visit: - Patient: [name] - Patient's wife: Ancil Boozer - CMA: Elvina Mattes, CMA - Provider: Dr Althea Charon   HPI   Sinusitis / Cough / Possible COVID Recent course >7 days with sinus congestion and cough, now worse lower respiratory symptoms. Worse over weekend. Has some low grade temp. No known sick contacts. No known covid exposure. - Tried old Albuterol inhaler PRN with some relief. Now out needs new order Tried OTC cold cough remedy temporary relief Admits nasal congestion pressure still Admits productive cough at times. Admits dyspnea only with coughing otherwise not on exertion Denies chest pain, swelling, headache, abdominal pain nausea vomiting diarrhea   Health Maintenance: Unvaccinated against COVID. Did not get Flu Shot   Depression screen [facility] 2/9 11/24/2019 11/24/2019 05/23/2019  Decreased Interest 0 0 0  Down, Depressed, Hopeless 0 0 0  PHQ - 2 Score 0 0 0    Social  History   Tobacco Use  . Smoking status: Never Smoker  . Smokeless tobacco: Never Used  Vaping Use  . Vaping Use: Never used  Substance Use Topics  . Alcohol use: Yes    Alcohol/week: 2.0 standard drinks    Types: 2 Glasses of wine per week    Comment: once a month  . Drug use: No    Review of Systems Per HPI unless specifically indicated above     Objective:    There were no vitals taken for this visit.  Wt Readings from Last 3 Encounters:  11/24/19 168 lb (76.2 kg)  05/23/19 166 lb (75.3 kg)  11/23/18 168 lb 9.6 oz (76.5 kg)    Physical Exam   Note examination was completely remotely via video observation objective data only  Gen - well-appearing, no acute distress or apparent pain, comfortable HEENT - eyes appear clear without discharge or redness - he is deaf, wife assist with interpretation Heart/Lungs - cannot examine virtually - observed no evidence of coughing or labored breathing. Abd - cannot examine virtually  Skin - face visible today- no rash Neuro - awake, alert, oriented Psych - not anxious appearing  Results for orders placed or performed in visit on 11/14/19  BASIC METABOLIC PANEL WITH GFR  Result Value Ref Range   Glucose, Bld 102 (H) 65 - 99 mg/dL   BUN 30 (H) 7 - 25 mg/dL   Creat 5.73 (H) 2.20 - 1.25 mg/dL   GFR, Est Non African American 44 (L) > OR = 60 mL/min/1.35m2   GFR, Est African American  51 (L) > OR = 60 mL/min/1.25m2   BUN/Creatinine Ratio 18 6 - 22 (calc)   Sodium 140 135 - 146 mmol/L   Potassium 4.3 3.5 - 5.3 mmol/L   Chloride 104 98 - 110 mmol/L   CO2 31 20 - 32 mmol/L   Calcium 9.3 8.6 - 10.3 mg/dL  Hemoglobin T0Z  Result Value Ref Range   Hgb A1c MFr Bld 5.8 (H) <5.7 % of total Hgb   Mean Plasma Glucose 120 (calc)   eAG (mmol/L) 6.6 (calc)      Assessment & Plan:   Problem List Items Addressed This Visit    None    Visit Diagnoses    Acute non-recurrent frontal sinusitis    -  Primary   Relevant Medications    ipratropium (ATROVENT) 0.06 % nasal spray   Suspected COVID-19 virus infection       Cough due to bronchospasm       Relevant Medications   albuterol (VENTOLIN HFA) 108 (90 Base) MCG/ACT inhaler      Consistent with acute frontal rhinosinusitis, likely initially viral URI allergic rhinitis component However cannot rule out viral infection causing sinus vs progression to bronchitis. Cannot rule out COVID in unvaccinated patient.  Possible progression if >7 days to bacterial sinusitis, discussed goal to test for COVID first and treat symptomatic. Reassurance, likely self-limited - no indication for antibiotics at this time  RECOMMEND [facility]. Notify us with result positive or negative. He should follow quarantine procedure until then.  If COVID is negative, and symptoms worsen after symptom treatment over next 48-72 hours, we can offer antibiotic for sinusitis such as Amox/Augmentin.  Refill albuterol PRN  Start Atrovent nasal spray decongestant 2 sprays in each nostril up to 4 times daily for 7 days  Return criteria reviewed   Meds ordered this encounter  Medications  . albuterol (VENTOLIN HFA) 108 (90 Base) MCG/ACT inhaler    Sig: Inhale 2 puffs into the lungs every 6 (six) hours as needed for wheezing or shortness of breath.    Dispense:  6.7 g    Refill:  2  . ipratropium (ATROVENT) 0.06 % nasal spray    Sig: Place 2 sprays into both nostrils 4 (four) times daily. For up to 5-7 days then stop.    Dispense:  15 mL    Refill:  0      Follow up plan: Return in about 1 week (around 03/11/2020), or if symptoms worsen or fail to improve, for cough / sinus, possible covid.   Patient verbalizes understanding with the above medical recommendations including the limitation of remote medical advice.  Specific follow-up and call-back criteria were given for patient to follow-up or seek medical care more urgently if needed.  Total duration of  direct patient care provided via video conference: 10 minutes  Saralyn Pilar, DO [facility] 03/04/2020, 1:34 PM

## 2020-03-04 NOTE — Patient Instructions (Addendum)
RECOMMEND COVID19 Testing [facility]. Notify us with result positive or negative. He should follow quarantine procedure until then.  Refill albuterol PRN  Start Atrovent nasal spray decongestant 2 sprays in each nostril up to 4 times daily for 7 days  Return criteria reviewed  Please schedule a Follow-up Appointment to: Return in about 1 week (around 03/11/2020), or if symptoms worsen or fail to improve, for cough / sinus, possible covid.  If you have any other questions or concerns, please feel free to call the office or send a message through MyChart. You may also schedule an earlier appointment if necessary.  Additionally, you may be receiving a survey about your experience at our office within a few days to 1 week by e-mail or mail. We value your feedback.  Saralyn Pilar, DO [facility], [place]

## 2020-03-06 ENCOUNTER — Telehealth: Payer: Self-pay | Admitting: Family Medicine

## 2020-03-06 DIAGNOSIS — J9801 Acute bronchospasm: Secondary | ICD-10-CM

## 2020-03-06 DIAGNOSIS — U071 COVID-19: Secondary | ICD-10-CM

## 2020-03-06 MED ORDER — PREDNISONE 20 MG PO TABS: ORAL_TABLET | ORAL | 0 refills | Status: DC

## 2020-03-06 NOTE — Telephone Encounter (Signed)
Called to inform [name] about Dr Charm Rings recommendation --she had read my chart message and got the Rx and he has already taken first dose. Appreciate call and Dr Charm Rings quick reply.

## 2020-03-06 NOTE — Addendum Note (Signed)
Addended by: Smitty Cords on: 03/06/2020 01:20 PM   Modules accepted: Orders

## 2020-03-06 NOTE — Telephone Encounter (Signed)
I attempted to call, did not reach him or his wife [name].  I left them a voice mail  I sent him detailed mychart message, and he has already responded.  Here was my last message to him to treat his cough.  At end of day if you can contact [name] to notify her of this message and make sure they have everything they need.  -----------  Alright. That works. We can order a Prednisone course to help with coughing. That is probably best medicine to slow the cough down. Keep using inhaler.  There is not good way to test to see if COVID is gone. That is not recommended. Typically it is a waiting period of 10-14 days after you tested positive until your quarantine can be lifted if you are no longer feverish and if symptoms have improved.  I will order Prednisone now, follow instructions, take one dose a day with meal - the amount of pills will change every few days as it is a dose taper over 7 days.  The covid test can still be positive for up to 90 days or more after you've been diagnosed, so test of cure is not reliable.  Saralyn Pilar, DO [facility] 03/06/2020, 2:06 PM

## 2020-03-06 NOTE — Telephone Encounter (Signed)
Pt has tested positive for covid. Pt is requesting something be called in for his cough. Pt is coughing a lot. Pt cannot hear on the phone and would like you to call wife [name] to discuss.  [id number] [place], [place] - [address] Phone:  [phone]  Fax:  [phone]

## 2020-03-25 MED ORDER — ALBUTEROL SULFATE HFA 108 (90 BASE) MCG/ACT IN AERS: 2.0000 | INHALATION_SPRAY | Freq: Four times a day (QID) | RESPIRATORY_TRACT | 2 refills | Status: DC | PRN

## 2020-03-25 NOTE — Addendum Note (Signed)
Addended by: Smitty Cords on: 03/25/2020 09:01 AM   Modules accepted: Orders

## 2020-03-26 ENCOUNTER — Other Ambulatory Visit: Payer: Self-pay | Admitting: Family Medicine

## 2020-03-26 DIAGNOSIS — J0110 Acute frontal sinusitis, unspecified: Secondary | ICD-10-CM

## 2020-03-26 NOTE — Telephone Encounter (Signed)
Requested medication (s) are due for refill today: no  Requested medication (s) are on the active medication list: yes  Last refill:  03/04/20  !5   0 refills  Future visit scheduled: yes  Notes to clinic:  Ordered for 5-7 day then stop Medication off protocol Please review.    Requested Prescriptions  Pending Prescriptions Disp Refills   ipratropium (ATROVENT) 0.06 % nasal spray [Pharmacy Med Name: IPRATROPIUM 0.06% SPRAY] 15 mL     Sig: Place 2 sprays into both nostrils 4 (four) times daily. For up to 5-7 days then stop.      Off-Protocol Failed - 03/26/2020 11:30 AM      Failed - Medication not assigned to a protocol, review manually.      Passed - Valid encounter within last 12 months    Recent Outpatient Visits           3 weeks ago Acute non-recurrent frontal sinusitis   [facility] [place], Netta Neat, DO   4 months ago Benign hypertension with CKD (chronic kidney disease) stage III   [facility] [place], Netta Neat, DO   10 months ago Annual physical exam   [facility] Smitty Cords, DO   1 year ago Benign hypertension with CKD (chronic kidney disease) stage III [facility])   [facility] Smitty Cords, DO   1 year ago Benign hypertension with CKD (chronic kidney disease) stage III [facility])   [facility] Smitty Cords, DO       Future Appointments             In 2 months Althea Charon, Netta Neat, DO [facility]            Off-Protocol Failed - 03/26/2020 11:30 AM      Failed - Medication not assigned to a protocol, review manually.      Passed - Valid encounter within last 12 months    Recent Outpatient Visits           3 weeks ago Acute non-recurrent frontal sinusitis   [facility] [place], Netta Neat, DO   4 months ago Benign hypertension with CKD (chronic kidney disease) stage III   [facility], Netta Neat, DO   10 months ago Annual physical exam   [facility] Smitty Cords, DO   1 year ago Benign hypertension with CKD (chronic kidney disease) stage III [facility])   [facility] Smitty Cords, DO   1 year ago Benign hypertension with CKD (chronic kidney disease) stage III [facility])   [facility] Smitty Cords, DO       Future Appointments             In 2 months Althea Charon, Netta Neat, DO [facility], [facility]

## 2020-05-22 ENCOUNTER — Other Ambulatory Visit: Payer: Self-pay | Admitting: *Deleted

## 2020-05-22 DIAGNOSIS — N1831 Chronic kidney disease, stage 3a: Secondary | ICD-10-CM

## 2020-05-22 DIAGNOSIS — N183 Chronic kidney disease, stage 3 unspecified: Secondary | ICD-10-CM

## 2020-05-22 DIAGNOSIS — N401 Enlarged prostate with lower urinary tract symptoms: Secondary | ICD-10-CM

## 2020-05-22 DIAGNOSIS — R7303 Prediabetes: Secondary | ICD-10-CM

## 2020-05-22 DIAGNOSIS — I129 Hypertensive chronic kidney disease with stage 1 through stage 4 chronic kidney disease, or unspecified chronic kidney disease: Secondary | ICD-10-CM

## 2020-05-22 DIAGNOSIS — R351 Nocturia: Secondary | ICD-10-CM

## 2020-05-22 DIAGNOSIS — Z0000 Encounter for general adult medical examination without abnormal findings: Secondary | ICD-10-CM

## 2020-05-23 ENCOUNTER — Other Ambulatory Visit: Payer: Self-pay

## 2020-05-23 ENCOUNTER — Other Ambulatory Visit: Payer: BC Managed Care – PPO

## 2020-05-23 DIAGNOSIS — N401 Enlarged prostate with lower urinary tract symptoms: Secondary | ICD-10-CM | POA: Diagnosis not present

## 2020-05-23 DIAGNOSIS — R351 Nocturia: Secondary | ICD-10-CM | POA: Diagnosis not present

## 2020-05-23 DIAGNOSIS — I129 Hypertensive chronic kidney disease with stage 1 through stage 4 chronic kidney disease, or unspecified chronic kidney disease: Secondary | ICD-10-CM | POA: Diagnosis not present

## 2020-05-23 DIAGNOSIS — R7303 Prediabetes: Secondary | ICD-10-CM | POA: Diagnosis not present

## 2020-05-23 DIAGNOSIS — N183 Chronic kidney disease, stage 3 unspecified: Secondary | ICD-10-CM | POA: Diagnosis not present

## 2020-05-24 LAB — COMPLETE METABOLIC PANEL WITH GFR
AG Ratio: 1.4 (calc) (ref 1.0–2.5)
ALT: 15 U/L (ref 9–46)
AST: 15 U/L (ref 10–35)
Albumin: 3.9 g/dL (ref 3.6–5.1)
Alkaline phosphatase (APISO): 73 U/L (ref 35–144)
BUN/Creatinine Ratio: 19 (calc) (ref 6–22)
BUN: 30 mg/dL — ABNORMAL HIGH (ref 7–25)
CO2: 29 mmol/L (ref 20–32)
Calcium: 9.1 mg/dL (ref 8.6–10.3)
Chloride: 105 mmol/L (ref 98–110)
Creat: 1.57 mg/dL — ABNORMAL HIGH (ref 0.70–1.25)
GFR, Est African American: 54 mL/min/{1.73_m2} — ABNORMAL LOW (ref >=60)
GFR, Est Non African American: 46 mL/min/{1.73_m2} — ABNORMAL LOW (ref >=60)
Globulin: 2.7 g/dL (calc) (ref 1.9–3.7)
Glucose, Bld: 103 mg/dL — ABNORMAL HIGH (ref 65–99)
Potassium: 4.5 mmol/L (ref 3.5–5.3)
Sodium: 141 mmol/L (ref 135–146)
Total Bilirubin: 0.5 mg/dL (ref 0.2–1.2)
Total Protein: 6.6 g/dL (ref 6.1–8.1)

## 2020-05-24 LAB — HEMOGLOBIN A1C
Hgb A1c MFr Bld: 5.9 % of total Hgb — ABNORMAL HIGH (ref <5.7)
Mean Plasma Glucose: 123 mg/dL
eAG (mmol/L): 6.8 mmol/L

## 2020-05-24 LAB — LIPID PANEL
Cholesterol: 163 mg/dL (ref <200)
HDL: 34 mg/dL — ABNORMAL LOW (ref >=40)
LDL Cholesterol (Calc): 105 mg/dL (calc) — ABNORMAL HIGH
Non-HDL Cholesterol (Calc): 129 mg/dL (calc) (ref <130)
Total CHOL/HDL Ratio: 4.8 (calc) (ref <5.0)
Triglycerides: 139 mg/dL (ref <150)

## 2020-05-24 LAB — CBC WITH DIFFERENTIAL/PLATELET
Absolute Monocytes: 445 cells/uL (ref 200–950)
Basophils Absolute: 31 cells/uL (ref 0–200)
Basophils Relative: 0.5 %
Eosinophils Absolute: 372 cells/uL (ref 15–500)
Eosinophils Relative: 6.1 %
HCT: 46.1 % (ref 38.5–50.0)
Hemoglobin: 15.3 g/dL (ref 13.2–17.1)
Lymphs Abs: 1147 cells/uL (ref 850–3900)
MCH: 30.2 pg (ref 27.0–33.0)
MCHC: 33.2 g/dL (ref 32.0–36.0)
MCV: 90.9 fL (ref 80.0–100.0)
MPV: 10 fL (ref 7.5–12.5)
Monocytes Relative: 7.3 %
Neutro Abs: 4105 cells/uL (ref 1500–7800)
Neutrophils Relative %: 67.3 %
Platelets: 210 10*3/uL (ref 140–400)
RBC: 5.07 10*6/uL (ref 4.20–5.80)
RDW: 12.8 % (ref 11.0–15.0)
Total Lymphocyte: 18.8 %
WBC: 6.1 10*3/uL (ref 3.8–10.8)

## 2020-05-24 LAB — PSA: PSA: 1.9 ng/mL (ref <=4.0)

## 2020-05-29 ENCOUNTER — Encounter: Payer: Self-pay | Admitting: Family Medicine

## 2020-05-29 ENCOUNTER — Other Ambulatory Visit: Payer: Self-pay | Admitting: Family Medicine

## 2020-05-29 ENCOUNTER — Ambulatory Visit (INDEPENDENT_AMBULATORY_CARE_PROVIDER_SITE_OTHER): Payer: BC Managed Care – PPO | Admitting: Family Medicine

## 2020-05-29 ENCOUNTER — Other Ambulatory Visit: Payer: Self-pay

## 2020-05-29 VITALS — BP 117/79 | HR 102 | Ht 68.0 in | Wt 179.0 lb

## 2020-05-29 DIAGNOSIS — R7303 Prediabetes: Secondary | ICD-10-CM

## 2020-05-29 DIAGNOSIS — N1831 Chronic kidney disease, stage 3a: Secondary | ICD-10-CM

## 2020-05-29 DIAGNOSIS — I129 Hypertensive chronic kidney disease with stage 1 through stage 4 chronic kidney disease, or unspecified chronic kidney disease: Secondary | ICD-10-CM

## 2020-05-29 DIAGNOSIS — K219 Gastro-esophageal reflux disease without esophagitis: Secondary | ICD-10-CM

## 2020-05-29 DIAGNOSIS — Z87442 Personal history of urinary calculi: Secondary | ICD-10-CM | POA: Diagnosis not present

## 2020-05-29 DIAGNOSIS — J9801 Acute bronchospasm: Secondary | ICD-10-CM

## 2020-05-29 DIAGNOSIS — N183 Chronic kidney disease, stage 3 unspecified: Secondary | ICD-10-CM

## 2020-05-29 DIAGNOSIS — E663 Overweight: Secondary | ICD-10-CM

## 2020-05-29 DIAGNOSIS — Z0000 Encounter for general adult medical examination without abnormal findings: Secondary | ICD-10-CM | POA: Diagnosis not present

## 2020-05-29 DIAGNOSIS — N401 Enlarged prostate with lower urinary tract symptoms: Secondary | ICD-10-CM

## 2020-05-29 DIAGNOSIS — R351 Nocturia: Secondary | ICD-10-CM

## 2020-05-29 DIAGNOSIS — I10 Essential (primary) hypertension: Secondary | ICD-10-CM

## 2020-05-29 MED ORDER — ESOMEPRAZOLE MAGNESIUM 20 MG PO CPDR: 20.0000 mg | DELAYED_RELEASE_CAPSULE | Freq: Every day | ORAL | 3 refills | Status: DC

## 2020-05-29 MED ORDER — TAMSULOSIN HCL 0.4 MG PO CAPS: 0.4000 mg | ORAL_CAPSULE | Freq: Every evening | ORAL | 3 refills | Status: DC

## 2020-05-29 MED ORDER — AMLODIPINE BESYLATE 5 MG PO TABS: 5.0000 mg | ORAL_TABLET | Freq: Every day | ORAL | 3 refills | Status: DC

## 2020-05-29 MED ORDER — LISINOPRIL 10 MG PO TABS: 10.0000 mg | ORAL_TABLET | Freq: Every day | ORAL | 3 refills | Status: DC

## 2020-05-29 MED ORDER — ALBUTEROL SULFATE HFA 108 (90 BASE) MCG/ACT IN AERS: 2.0000 | INHALATION_SPRAY | Freq: Four times a day (QID) | RESPIRATORY_TRACT | 2 refills | Status: DC | PRN

## 2020-05-29 NOTE — Assessment & Plan Note (Signed)
Encourage lifestyle weight loss 

## 2020-05-29 NOTE — Telephone Encounter (Signed)
Requested medications are due for refill today yes  Requested medications are on the active medication list yes  Last refill 12/2  Last visit 8/21  Future visit scheduled today  Notes to clinic please assess at OV today.

## 2020-05-29 NOTE — Assessment & Plan Note (Signed)
Well-controlled HTN - Home BP readings reviewed Complication with CKD III   Plan: 1. Stop Metoprolol 12.5mg  BID - no longer needed for HTN, may be helping migraine prevention but unsure, will DC for now 2. Continue current Amlodipine 5mg  daily, Lisinopril 10mg  daily 3. Encourage improved lifestyle - low sodium diet, regular exercise 4. Keep monitor BP outside office, bring readings to next visit, if persistently >140/90 or new symptoms notify office sooner

## 2020-05-29 NOTE — Patient Instructions (Addendum)
Thank you for coming to the office today.  Recent Labs    11/14/19 0758 05/23/20 0800  HGBA1C 5.8* 5.9*   Stable sugar overall. Still in Pre-Diabetes range.  Mild elevated cholesterol 105, goal is < 100. I would recommend considering the option of a low dose rx Cholesterol pill Statin that can lower cholesterol and also primarily reduce your future risk of heart attack and stroke.  Stop taking half tab of Metoprolol twice a day. Discontinue this med completely. You likely no longer need it for your blood pressure  Renewed all other medications.  The tightness in chest at times can be prolonged effect after COVID  The swelling in leg is likely from venous insufficiency or leaky veins, this most likely is a long term issue that can come and go and is worse on your feet. Let me know if you want a referral to discuss with a Vein Specialist Vascular Doctor  DUE for FASTING BLOOD WORK (no food or drink after midnight before the lab appointment, only water or coffee without cream/sugar on the morning of)  SCHEDULE "Lab Only" visit in the morning at the clinic for lab draw in 6 MONTHS   - Make sure Lab Only appointment is at about 1 week before your next appointment, so that results will be available  For Lab Results, once available within 2-3 days of blood draw, you can can log in to MyChart online to view your results and a brief explanation. Also, we can discuss results at next follow-up visit.   Please schedule a Follow-up Appointment to: Return in about 6 months (around 11/29/2020) for 6 month fasting lab only then 1 week later Follow-up HTN, PreDM, HLD.  If you have any other questions or concerns, please feel free to call the office or send a message through MyChart. You may also schedule an earlier appointment if necessary.  Additionally, you may be receiving a survey about your experience at our office within a few days to 1 week by e-mail or mail. We value your feedback.  Saralyn Pilar, DO [facility], [place]

## 2020-05-29 NOTE — Assessment & Plan Note (Signed)
See A&P HTN Likely secondary to age, HTN, Sugar, history of nephrolithiasis Stable, last lab Cr 1.57 GFR 46 maintained Cr at baseline 1.4-1.6

## 2020-05-29 NOTE — Assessment & Plan Note (Signed)
Controlled chronic BPH with nocturia On Flomax 0.4mg  daily

## 2020-05-29 NOTE — Assessment & Plan Note (Signed)
Stable A1c 5.9 Prior range 5.9 to 6.1  Plan:  1. Not on any therapy currently  2. Encourage improved lifestyle - low carb, low sugar diet, reduce portion size, continue improving regular exercise, handout given

## 2020-05-29 NOTE — Assessment & Plan Note (Signed)
Controlled on PPI Refill generic 20mg  daily

## 2020-05-29 NOTE — Progress Notes (Signed)
Subjective:    Patient ID: [name], male    DOB: Mar 13, 1956, 65 y.o.   MRN: [id number]  [name] is a 65 y.o. male presenting on 05/29/2020 for Annual Exam   HPI   Here for Annual physical and Lab Review.  Patient has hearing loss, he is able to read lips.  CHRONIC HTNwith CKD-III History of Nephrolithiasis History of TIA Recent lab result Creatinine 1.5, still stable and consistent with prior range 1.4 to 1.6 He has history of CKD in past. No chronic use NSAID. He has had recurrent kidney stones in past, some significant ones - Previously 2020 Amlodipine was reduced from 10 to 5mg , he has done well. Checking BP at home - BP 110-120 / 70-80s, still very well controlled - similar to readings last visit. Current Meds -Lisinopril 10mg  daily, Metoprolol 12.5mg  BID (half of 25), Amlodipine5mg  He does admit history of migraine headaches, metoprolol and amlodipine have helped reduce headaches - no further recurrent headaches Reports good compliance, took meds today. Tolerating well, w/o complaints. Denies CP, dyspnea, HA, edema, dizziness / lightheadedness  Pre-Diabetes Prio A1c 5.9 to 6.1 Last lab 5.9 CBGs:not checking Meds:not on med Currently on ACEi Lifestyle: Diet improved - Exercise (limited) Denies hypoglycemia, polyuria, visual changes, numbness or tingling.  GERD Chronic GERD heartburn, controlled on PPI Request generic order  History of Migraine HA No more migraines, with BP controlled  BPH nocturia Reports frequent nocturia symptoms, without worsening, no other significant urinary symptoms.Taking Tamsulosin 0.4mg  nightly Improved significantly with less nocturia  PMHNephrolithiasis   Health Maintenance:  Due for Flu Shot, declines today despite counseling on benefits  Declines COVID19 vaccine for future  PSA 1.9 (05/2020) negative.  [facility] 11/24/2019 11/24/2019 05/23/2019  Decreased Interest 0 0 0  Down,  Depressed, Hopeless 0 0 0  PHQ - 2 Score 0 0 0    Past Medical History:  Diagnosis Date  . Allergy   . Asthma   . Chronic kidney disease   . GERD (gastroesophageal reflux disease)   . Headache   . Hypertension   . TGA (transient global amnesia)    Past Surgical History:  Procedure Laterality Date  . COLONOSCOPY WITH PROPOFOL N/A 12/04/2015   Procedure: COLONOSCOPY WITH PROPOFOL;  Surgeon: Scot Junobert T Elliott, MD;  [facility];  Service: Endoscopy;  Laterality: N/A;  . LITHOTRIPSY    . URETEROSCOPY     Social History   Socioeconomic History  . Marital status: Married    Spouse name: Not on file  . Number of children: Not on file  . Years of education: College  . Highest education level: Not on file  Occupational History  . Not on file  Tobacco Use  . Smoking status: Never Smoker  . Smokeless tobacco: Never Used  Vaping Use  . Vaping Use: Never used  Substance and Sexual Activity  . Alcohol use: Yes    Alcohol/week: 2.0 standard drinks    Types: 2 Glasses of wine per week    Comment: once a month  . Drug use: No  . Sexual activity: Not on file  Other Topics Concern  . Not on file  Social History Narrative  . Not on file   Social Determinants of Health   Financial Resource Strain: Not on file  Food Insecurity: Not on file  Transportation Needs: Not on file  Physical Activity: Not on file  Stress: Not on file  Social Connections: Not on file  Intimate  Partner Violence: Not on file   Family History  Problem Relation Age of Onset  . COPD Mother   . CAD Mother   . Alzheimer's disease Father   . Diabetes Father    Current Outpatient Medications on File Prior to Visit  Medication Sig  . aspirin EC 81 MG EC tablet Take 1 tablet (81 mg total) by mouth daily.  Marland Kitchen ipratropium (ATROVENT) 0.06 % nasal spray Place 2 sprays into both nostrils 4 (four) times daily. As needed  . [DISCONTINUED] omeprazole (PRILOSEC) 20 MG capsule Take 1 capsule (20 mg total) by  mouth daily before breakfast.   No current facility-administered medications on file prior to visit.    Review of Systems  Constitutional: Negative for activity change, appetite change, chills, diaphoresis, fatigue and fever.  HENT: Negative for congestion and hearing loss.   Eyes: Negative for visual disturbance.  Respiratory: Negative for cough, chest tightness, shortness of breath and wheezing.   Cardiovascular: Negative for chest pain, palpitations and leg swelling.  Gastrointestinal: Negative for abdominal pain, constipation, diarrhea, nausea and vomiting.  Genitourinary: Negative for dysuria, frequency and hematuria.  Musculoskeletal: Negative for arthralgias and neck pain.  Skin: Negative for rash.  Neurological: Negative for dizziness, weakness, light-headedness, numbness and headaches.  Hematological: Negative for adenopathy.  Psychiatric/Behavioral: Negative for behavioral problems, dysphoric mood and sleep disturbance.   Per HPI unless specifically indicated above      Objective:    BP 117/79   Pulse (!) 102   Ht 5\' 8"  (1.727 m)   Wt 179 lb (81.2 kg)   SpO2 96%   BMI 27.22 kg/m   Wt Readings from Last 3 Encounters:  05/29/20 179 lb (81.2 kg)  11/24/19 168 lb (76.2 kg)  05/23/19 166 lb (75.3 kg)    Physical Exam Vitals and nursing note reviewed.  Constitutional:      General: He is not in acute distress.    Appearance: He is well-developed and well-nourished. He is not diaphoretic.     Comments: Well-appearing, comfortable, cooperative  HENT:     Head: Normocephalic and atraumatic.     Comments: Hearing loss, reads lips    Mouth/Throat:     Mouth: Oropharynx is clear and moist.  Eyes:     General:        Right eye: No discharge.        Left eye: No discharge.     Extraocular Movements: EOM normal.     Conjunctiva/sclera: Conjunctivae normal.     Pupils: Pupils are equal, round, and reactive to light.  Neck:     Thyroid: No thyromegaly.   Cardiovascular:     Rate and Rhythm: Normal rate and regular rhythm.     Pulses: Intact distal pulses.     Heart sounds: Normal heart sounds. No murmur heard.   Pulmonary:     Effort: Pulmonary effort is normal. No respiratory distress.     Breath sounds: Normal breath sounds. No wheezing or rales.  Abdominal:     General: Bowel sounds are normal. There is no distension.     Palpations: Abdomen is soft. There is no mass.     Tenderness: There is no abdominal tenderness.  Musculoskeletal:        General: No tenderness. Normal range of motion.     Cervical back: Normal range of motion and neck supple.     Right lower leg: Edema (venous stasis dermatitis and slight darkening, mild trace edema compared to left, non tender  no ulceration) present.     Left lower leg: No edema.     Comments: Upper / Lower Extremities: - Normal muscle tone, strength bilateral upper extremities 5/5, lower extremities 5/5  Lymphadenopathy:     Cervical: No cervical adenopathy.  Skin:    General: Skin is warm and dry.     Findings: No erythema or rash.  Neurological:     Mental Status: He is alert and oriented to person, place, and time.     Comments: Distal sensation intact to light touch all extremities  Psychiatric:        Mood and Affect: Mood and affect normal.        Behavior: Behavior normal.     Comments: Well groomed, good eye contact, normal speech and thoughts       Results for orders placed or performed in visit on 05/22/20  PSA  Result Value Ref Range   PSA 1.90 < OR = 4.0 ng/mL  Lipid panel  Result Value Ref Range   Cholesterol 163 <200 mg/dL   HDL 34 (L) > OR = 40 mg/dL   Triglycerides 578 <469 mg/dL   LDL Cholesterol (Calc) 105 (H) mg/dL (calc)   Total CHOL/HDL Ratio 4.8 <5.0 (calc)   Non-HDL Cholesterol (Calc) 129 <130 mg/dL (calc)  COMPLETE METABOLIC PANEL WITH GFR  Result Value Ref Range   Glucose, Bld 103 (H) 65 - 99 mg/dL   BUN 30 (H) 7 - 25 mg/dL   Creat 6.29 (H) 5.28  - 1.25 mg/dL   GFR, Est Non African American 46 (L) > OR = 60 mL/min/1.4m2   GFR, Est African American 54 (L) > OR = 60 mL/min/1.26m2   BUN/Creatinine Ratio 19 6 - 22 (calc)   Sodium 141 135 - 146 mmol/L   Potassium 4.5 3.5 - 5.3 mmol/L   Chloride 105 98 - 110 mmol/L   CO2 29 20 - 32 mmol/L   Calcium 9.1 8.6 - 10.3 mg/dL   Total Protein 6.6 6.1 - 8.1 g/dL   Albumin 3.9 3.6 - 5.1 g/dL   Globulin 2.7 1.9 - 3.7 g/dL (calc)   AG Ratio 1.4 1.0 - 2.5 (calc)   Total Bilirubin 0.5 0.2 - 1.2 mg/dL   Alkaline phosphatase (APISO) 73 35 - 144 U/L   AST 15 10 - 35 U/L   ALT 15 9 - 46 U/L  CBC with Differential/Platelet  Result Value Ref Range   WBC 6.1 3.8 - 10.8 Thousand/uL   RBC 5.07 4.20 - 5.80 Million/uL   Hemoglobin 15.3 13.2 - 17.1 g/dL   HCT 41.3 24.4 - 01.0 %   MCV 90.9 80.0 - 100.0 fL   MCH 30.2 27.0 - 33.0 pg   MCHC 33.2 32.0 - 36.0 g/dL   RDW 27.2 53.6 - 64.4 %   Platelets 210 140 - 400 Thousand/uL   MPV 10.0 7.5 - 12.5 fL   Neutro Abs 4,105 1,500 - 7,800 cells/uL   Lymphs Abs 1,147 850 - 3,900 cells/uL   Absolute Monocytes 445 200 - 950 cells/uL   Eosinophils Absolute 372 15 - 500 cells/uL   Basophils Absolute 31 0 - 200 cells/uL   Neutrophils Relative % 67.3 %   Total Lymphocyte 18.8 %   Monocytes Relative 7.3 %   Eosinophils Relative 6.1 %   Basophils Relative 0.5 %  Hemoglobin A1c  Result Value Ref Range   Hgb A1c MFr Bld 5.9 (H) <5.7 % of total Hgb   Mean Plasma Glucose 123  mg/dL   eAG (mmol/L) 6.8 mmol/L      Assessment & Plan:   Problem List Items Addressed This Visit    Pre-diabetes    Stable A1c 5.9 Prior range 5.9 to 6.1  Plan:  1. Not on any therapy currently  2. Encourage improved lifestyle - low carb, low sugar diet, reduce portion size, continue improving regular exercise, handout given      Overweight (BMI 25.0-29.9)    Encourage lifestyle weight loss      History of nephrolithiasis   Relevant Medications   tamsulosin (FLOMAX) 0.4 MG CAPS  capsule   Gastroesophageal reflux disease    Controlled on PPI Refill generic 20mg  daily      Relevant Medications   esomeprazole (NEXIUM) 20 MG capsule   CKD (chronic kidney disease), stage III (HCC)    See A&P HTN Likely secondary to age, HTN, Sugar, history of nephrolithiasis Stable, last lab Cr 1.57 GFR 46 maintained Cr at baseline 1.4-1.6      Benign prostatic hyperplasia with nocturia    Controlled chronic BPH with nocturia On Flomax 0.4mg  daily      Benign hypertension with CKD (chronic kidney disease) stage III (HCC)    Well-controlled HTN - Home BP readings reviewed Complication with CKD III   Plan: 1. Stop Metoprolol 12.5mg  BID - no longer needed for HTN, may be helping migraine prevention but unsure, will DC for now 2. Continue current Amlodipine 5mg  daily, Lisinopril 10mg  daily 3. Encourage improved lifestyle - low sodium diet, regular exercise 4. Keep monitor BP outside office, bring readings to next visit, if persistently >140/90 or new symptoms notify office sooner      Relevant Medications   amLODipine (NORVASC) 5 MG tablet   lisinopril (ZESTRIL) 10 MG tablet    Other Visit Diagnoses    Annual physical exam    -  Primary   Cough due to bronchospasm       Relevant Medications   albuterol (VENTOLIN HFA) 108 (90 Base) MCG/ACT inhaler      Updated Health Maintenance information Due for COVID vaccine, declines Reviewed recent lab results with patient Encouraged improvement to lifestyle with diet and exercise Goal of weight loss  ASCVD risk >11% Offered statin therapy, he declined today despite counseling On ASA 81mg    Meds ordered this encounter  Medications  . amLODipine (NORVASC) 5 MG tablet    Sig: Take 1 tablet (5 mg total) by mouth daily.    Dispense:  90 tablet    Refill:  3  . albuterol (VENTOLIN HFA) 108 (90 Base) MCG/ACT inhaler    Sig: Inhale 2 puffs into the lungs every 6 (six) hours as needed for wheezing or shortness of breath.     Dispense:  6.7 g    Refill:  2  . lisinopril (ZESTRIL) 10 MG tablet    Sig: Take 1 tablet (10 mg total) by mouth daily.    Dispense:  90 tablet    Refill:  3  . tamsulosin (FLOMAX) 0.4 MG CAPS capsule    Sig: Take 1 capsule (0.4 mg total) by mouth every evening.    Dispense:  90 capsule    Refill:  3  . esomeprazole (NEXIUM) 20 MG capsule    Sig: Take 1 capsule (20 mg total) by mouth daily before breakfast.    Dispense:  90 capsule    Refill:  3     Follow up plan: Return in about 6 months (around 11/29/2020) for 6 month  fasting lab only then 1 week later Follow-up HTN, PreDM, HLD.   Future labs 11/29/20 A1c, BMET 6 month  Saralyn Pilar, DO [facility] 05/29/2020, 8:11 AM

## 2020-11-21 ENCOUNTER — Ambulatory Visit: Payer: Self-pay | Admitting: *Deleted

## 2020-11-21 NOTE — Telephone Encounter (Signed)
No triage performed. Wife was the caller and requesting appointment for the patient. Patient has lower leg and ankle pain with mild swelling and redness. Scheduled with Nicki Reaper, NP for Monday 8/29 @3pm . PCP out of office on this day.

## 2020-11-25 ENCOUNTER — Encounter: Payer: Self-pay | Admitting: Internal Medicine

## 2020-11-25 ENCOUNTER — Other Ambulatory Visit: Payer: Self-pay

## 2020-11-25 ENCOUNTER — Ambulatory Visit (INDEPENDENT_AMBULATORY_CARE_PROVIDER_SITE_OTHER): Payer: BC Managed Care – PPO | Admitting: Internal Medicine

## 2020-11-25 ENCOUNTER — Ambulatory Visit: Payer: BC Managed Care – PPO | Admitting: Internal Medicine

## 2020-11-25 VITALS — BP 114/70 | HR 96 | Temp 98.6°F | Resp 17 | Ht 68.0 in | Wt 182.0 lb

## 2020-11-25 DIAGNOSIS — R21 Rash and other nonspecific skin eruption: Secondary | ICD-10-CM

## 2020-11-25 DIAGNOSIS — M7989 Other specified soft tissue disorders: Secondary | ICD-10-CM

## 2020-11-25 DIAGNOSIS — R7303 Prediabetes: Secondary | ICD-10-CM | POA: Diagnosis not present

## 2020-11-25 DIAGNOSIS — N183 Chronic kidney disease, stage 3 unspecified: Secondary | ICD-10-CM | POA: Diagnosis not present

## 2020-11-25 DIAGNOSIS — N1831 Chronic kidney disease, stage 3a: Secondary | ICD-10-CM | POA: Diagnosis not present

## 2020-11-25 DIAGNOSIS — I129 Hypertensive chronic kidney disease with stage 1 through stage 4 chronic kidney disease, or unspecified chronic kidney disease: Secondary | ICD-10-CM

## 2020-11-25 MED ORDER — PREDNISONE 10 MG PO TABS: ORAL_TABLET | ORAL | 0 refills | Status: DC

## 2020-11-25 MED ORDER — CLOBETASOL PROPIONATE 0.05 % EX OINT: 1.0000 "application " | TOPICAL_OINTMENT | Freq: Two times a day (BID) | CUTANEOUS | 0 refills | Status: DC

## 2020-11-25 NOTE — Patient Instructions (Signed)
Rash, Adult  A rash is a change in the color of your skin. A rash can also change the way your skin feels. There are many different conditions and factors that can causea rash. Follow these instructions at home: The goal of treatment is to stop the itching and keep the rash from spreading. Watch for any changes in your symptoms. Let your doctor know about them. Followthese instructions to help with your condition: Medicine Take or apply over-the-counter and prescription medicines only as told by your doctor. These may include medicines: To treat red or swollen skin (corticosteroid creams). To treat itching. To treat an allergy (oral antihistamines). To treat very bad symptoms (oral corticosteroids).  Skin care Put cool cloths (compresses) on the affected areas. Do not scratch or rub your skin. Avoid covering the rash. Make sure that the rash is exposed to air as much as possible. Managing itching and discomfort Avoid hot showers or baths. These can make itching worse. A cold shower may help. Try taking a bath with: Epsom salts. You can get these at your local pharmacy or grocery store. Follow the instructions on the package. Baking soda. Pour a small amount into the bath as told by your doctor. Colloidal oatmeal. You can get this at your local pharmacy or grocery store. Follow the instructions on the package. Try putting baking soda paste onto your skin. Stir water into baking soda until it gets like a paste. Try putting on a lotion that relieves itchiness (calamine lotion). Keep cool and out of the sun. Sweating and being hot can make itching worse. General instructions  Rest as needed. Drink enough fluid to keep your pee (urine) pale yellow. Wear loose-fitting clothing. Avoid scented soaps, detergents, and perfumes. Use gentle soaps, detergents, perfumes, and other cosmetic products. Avoid anything that causes your rash. Keep a journal to help track what causes your rash. Write  down: What you eat. What cosmetic products you use. What you drink. What you wear. This includes jewelry. Keep all follow-up visits as told by your doctor. This is important.  Contact a doctor if: You sweat at night. You lose weight. You pee (urinate) more than normal. You pee less than normal, or you notice that your pee is a darker color than normal. You feel weak. You throw up (vomit). Your skin or the whites of your eyes look yellow (jaundice). Your skin: Tingles. Is numb. Your rash: Does not go away after a few days. Gets worse. You are: More thirsty than normal. More tired than normal. You have: New symptoms. Pain in your belly (abdomen). A fever. Watery poop (diarrhea). Get help right away if: You have a fever and your symptoms suddenly get worse. You start to feel mixed up (confused). You have a very bad headache or a stiff neck. You have very bad joint pains or stiffness. You have jerky movements that you cannot control (seizure). Your rash covers all or most of your body. The rash may or may not be painful. You have blisters that: Are on top of the rash. Grow larger. Grow together. Are painful. Are inside your nose or mouth. You have a rash that: Looks like purple pinprick-sized spots all over your body. Has a "bull's eye" or looks like a target. Is red and painful, causes your skin to peel, and is not from being in the sun too long. Summary A rash is a change in the color of your skin. A rash can also change the way your skin feels.   The goal of treatment is to stop the itching and keep the rash from spreading. Take or apply over-the-counter and prescription medicines only as told by your doctor. Contact a doctor if you have new symptoms or symptoms that get worse. Keep all follow-up visits as told by your doctor. This is important. This information is not intended to replace advice given to you by your health care provider. Make sure you discuss any  questions you have with your healthcare provider. Document Revised: 07/08/2018 Document Reviewed: 10/18/2017 Elsevier Patient Education  2022 Elsevier Inc.  

## 2020-11-25 NOTE — Progress Notes (Signed)
Subjective:    Patient ID: [name], male    DOB: 06-15-1955, 65 y.o.   MRN: [id number]  HPI  Patient presents the clinic today with complaint of lower extremity redness, swelling and rash 8 months ago after getting COVID he noticed this.  He reports the swelling is not necessarily uncomfortable but he does have some intermittent tingling in the area.  He reports the redness and rash did not appear until about 3 to 4 weeks ago.  The rash does not itch or burn.  It does seem to spread over his right foot.  He has been putting eczema cream on it with minimal relief.  He reports he is also been taking Benadryl but that also makes him very sleepy.  He has no family history of psoriasis or lupus.  Review of Systems     Past Medical History:  Diagnosis Date   Allergy    Asthma    Chronic kidney disease    GERD (gastroesophageal reflux disease)    Headache    Hypertension    TGA (transient global amnesia)     Current Outpatient Medications  Medication Sig Dispense Refill   albuterol (VENTOLIN HFA) 108 (90 Base) MCG/ACT inhaler Inhale 2 puffs into the lungs every 6 (six) hours as needed for wheezing or shortness of breath. 6.7 g 2   amLODipine (NORVASC) 5 MG tablet Take 1 tablet (5 mg total) by mouth daily. 90 tablet 3   aspirin EC 81 MG EC tablet Take 1 tablet (81 mg total) by mouth daily. 120 tablet 0   esomeprazole (NEXIUM) 20 MG capsule Take 1 capsule (20 mg total) by mouth daily before breakfast. 90 capsule 3   ipratropium (ATROVENT) 0.06 % nasal spray Place 2 sprays into both nostrils 4 (four) times daily. As needed 15 mL 2   lisinopril (ZESTRIL) 10 MG tablet Take 1 tablet (10 mg total) by mouth daily. 90 tablet 3   tamsulosin (FLOMAX) 0.4 MG CAPS capsule Take 1 capsule (0.4 mg total) by mouth every evening. 90 capsule 3   No current facility-administered medications for this visit.    Allergies  Allergen Reactions   Seasonal Ic [Cholestatin]     Family History  Problem  Relation Age of Onset   COPD Mother    CAD Mother    Alzheimer's disease Father    Diabetes Father     Social History   Socioeconomic History   Marital status: Married    Spouse name: Not on file   Number of children: Not on file   Years of education: College   Highest education level: Not on file  Occupational History   Not on file  Tobacco Use   Smoking status: Never   Smokeless tobacco: Never  Vaping Use   Vaping Use: Never used  Substance and Sexual Activity   Alcohol use: Yes    Alcohol/week: 2.0 standard drinks    Types: 2 Glasses of wine per week    Comment: once a month   Drug use: No   Sexual activity: Not on file  Other Topics Concern   Not on file  Social History Narrative   Not on file   Social Determinants of Health   Financial Resource Strain: Not on file  Food Insecurity: Not on file  Transportation Needs: Not on file  Physical Activity: Not on file  Stress: Not on file  Social Connections: Not on file  Intimate Partner Violence: Not on file  Constitutional: Denies fever, malaise, fatigue, headache or abrupt weight changes.  Respiratory: Denies difficulty breathing, shortness of breath, cough or sputum production.   Cardiovascular: Patient reports swelling of his right lower extremity.  Denies chest pain, chest tightness, palpitations or swelling in the hands.  Musculoskeletal: Denies decrease in range of motion, difficulty with gait, muscle pain or joint pain and swelling.  Skin: Patient reports redness and rash of lower extremity.  Denies lesions or ulcercations.    No other specific complaints in a complete review of systems (except as listed in HPI above).  Objective:   Physical Exam   BP 114/70 (BP Location: Right Arm, Patient Position: Sitting, Cuff Size: Normal)   Pulse 96   Temp 98.6 F (37 C) (Temporal)   Resp 17   Ht $R'5\' 8"'bT$  (1.727 m)   Wt 182 lb (82.6 kg)   SpO2 100%   BMI 27.67 kg/m   Wt Readings from Last 3 Encounters:   05/29/20 179 lb (81.2 kg)  11/24/19 168 lb (76.2 kg)  05/23/19 166 lb (75.3 kg)    General: Appears his stated age, overweight, in NAD. Skin: Scaly rash on erythematous base noted circumferentially of his mid shin/calf down to his ankle, also overlying his second through fourth metatarsals. HEENT: Head: normal shape and size; Eyes: sclera white and EOMs intact;  Cardiovascular: Normal rate and rhythm. S1,S2 noted.  No murmur, rubs or gallops noted.  1+ pitting RLE edema.  Pulmonary/Chest: Normal effort and positive vesicular breath sounds. No respiratory distress. No wheezes, rales or ronchi noted.  Musculoskeletal: No difficulty with gait.  Neurological: Alert and oriented.  BMET    Component Value Date/Time   NA 141 05/23/2020 0800   NA 139 04/22/2018 0000   K 4.5 05/23/2020 0800   CL 105 05/23/2020 0800   CO2 29 05/23/2020 0800   GLUCOSE 103 (H) 05/23/2020 0800   BUN 30 (H) 05/23/2020 0800   BUN 25 (A) 04/22/2018 0000   CREATININE 1.57 (H) 05/23/2020 0800   CALCIUM 9.1 05/23/2020 0800   GFRNONAA 46 (L) 05/23/2020 0800   GFRAA 54 (L) 05/23/2020 0800    Lipid Panel     Component Value Date/Time   CHOL 163 05/23/2020 0800   TRIG 139 05/23/2020 0800   HDL 34 (L) 05/23/2020 0800   CHOLHDL 4.8 05/23/2020 0800   VLDL 31 08/30/2015 0347   LDLCALC 105 (H) 05/23/2020 0800    CBC    Component Value Date/Time   WBC 6.1 05/23/2020 0800   RBC 5.07 05/23/2020 0800   HGB 15.3 05/23/2020 0800   HCT 46.1 05/23/2020 0800   PLT 210 05/23/2020 0800   MCV 90.9 05/23/2020 0800   MCH 30.2 05/23/2020 0800   MCHC 33.2 05/23/2020 0800   RDW 12.8 05/23/2020 0800   LYMPHSABS 1,147 05/23/2020 0800   MONOABS 0.2 08/29/2015 1116   EOSABS 372 05/23/2020 0800   BASOSABS 31 05/23/2020 0800    Hgb A1C Lab Results  Component Value Date   HGBA1C 5.9 (H) 05/23/2020           Assessment & Plan:   Right Lower Extremity Swelling, Redness and Rash, Prediabetes:  Will obtain venous  Doppler to rule out chronic DVT Will check CBC, be met, ESR, CRP, ANA, C3, C4, antiphospholipid antibody and antidouble-stranded DNA Rx for Pred taper x9 days Once completed, start cCobetasol ointment twice daily Consider referral to specialist once labs are back  Return precautions discussed Webb Silversmith, NP This visit occurred during  the SARS-CoV-2 public health emergency.  Safety protocols were in place, including screening questions prior to the visit, additional usage of staff PPE, and extensive cleaning of exam room while observing appropriate contact time as indicated for disinfecting solutions.

## 2020-11-26 LAB — BASIC METABOLIC PANEL WITH GFR
BUN/Creatinine Ratio: 17 (calc) (ref 6–22)
BUN: 30 mg/dL — ABNORMAL HIGH (ref 7–25)
CO2: 25 mmol/L (ref 20–32)
Calcium: 9.4 mg/dL (ref 8.6–10.3)
Chloride: 105 mmol/L (ref 98–110)
Creat: 1.8 mg/dL — ABNORMAL HIGH (ref 0.70–1.35)
Glucose, Bld: 95 mg/dL (ref 65–139)
Potassium: 4.4 mmol/L (ref 3.5–5.3)
Sodium: 140 mmol/L (ref 135–146)
eGFR: 42 mL/min/{1.73_m2} — ABNORMAL LOW (ref >=60)

## 2020-11-26 LAB — HEMOGLOBIN A1C
Hgb A1c MFr Bld: 6 % of total Hgb — ABNORMAL HIGH (ref <5.7)
Mean Plasma Glucose: 126 mg/dL
eAG (mmol/L): 7 mmol/L

## 2020-11-27 ENCOUNTER — Telehealth: Payer: Self-pay

## 2020-11-27 NOTE — Telephone Encounter (Signed)
Copied from CRM [id number]. Topic: General - Other >> Nov 26, 2020  3:23 PM Pawlus, Maxine Glenn A wrote: Reason for CRM: Pt is hard of hearing, if you need to call the pt please call the pts wife. FYI

## 2020-11-28 ENCOUNTER — Encounter: Payer: Self-pay | Admitting: Internal Medicine

## 2020-11-28 NOTE — Telephone Encounter (Signed)
He wants to be texted or call his wife regarding his results

## 2020-11-29 ENCOUNTER — Other Ambulatory Visit: Payer: BC Managed Care – PPO

## 2020-11-29 LAB — CBC
HCT: 47.3 % (ref 38.5–50.0)
Hemoglobin: 15.6 g/dL (ref 13.2–17.1)
MCH: 29.9 pg (ref 27.0–33.0)
MCHC: 33 g/dL (ref 32.0–36.0)
MCV: 90.6 fL (ref 80.0–100.0)
MPV: 9.6 fL (ref 7.5–12.5)
Platelets: 248 10*3/uL (ref 140–400)
RBC: 5.22 10*6/uL (ref 4.20–5.80)
RDW: 12.1 % (ref 11.0–15.0)
WBC: 8.9 10*3/uL (ref 3.8–10.8)

## 2020-11-29 LAB — ANTIPHOSPHOLIPID SYNDROME DIAGNOSTIC PANEL
Anticardiolipin IgA: 3.2 APL-U/mL (ref <20.0)
Anticardiolipin IgG: <2 GPL-U/mL (ref <20.0)
Anticardiolipin IgM: 5.3 MPL-U/mL (ref <20.0)
Beta-2 Glyco 1 IgA: 3.5 U/mL (ref <20.0)
Beta-2 Glyco 1 IgM: 6.7 U/mL (ref <20.0)
Beta-2 Glyco I IgG: <2 U/mL (ref <20.0)
PTT-LA Screen: 34 s (ref <=40)
dRVVT: 41 s (ref <=45)

## 2020-11-29 LAB — ANTI-DNA ANTIBODY, DOUBLE-STRANDED: ds DNA Ab: 4 IU/mL

## 2020-11-29 LAB — ANA: Anti Nuclear Antibody (ANA): POSITIVE — AB

## 2020-11-29 LAB — C3 AND C4
C3 Complement: 137 mg/dL (ref 82–185)
C4 Complement: 28 mg/dL (ref 15–53)

## 2020-11-29 LAB — SEDIMENTATION RATE: Sed Rate: 17 mm/h (ref 0–20)

## 2020-11-29 LAB — ANTI-NUCLEAR AB-TITER (ANA TITER): ANA Titer 1: 1:80 {titer} — ABNORMAL HIGH

## 2020-11-29 LAB — C-REACTIVE PROTEIN: CRP: 8 mg/L — ABNORMAL HIGH (ref <8.0)

## 2020-12-06 ENCOUNTER — Encounter: Payer: Self-pay | Admitting: Family Medicine

## 2020-12-06 ENCOUNTER — Other Ambulatory Visit: Payer: Self-pay

## 2020-12-06 ENCOUNTER — Ambulatory Visit: Payer: BC Managed Care – PPO | Admitting: Family Medicine

## 2020-12-06 ENCOUNTER — Ambulatory Visit (INDEPENDENT_AMBULATORY_CARE_PROVIDER_SITE_OTHER): Payer: BC Managed Care – PPO | Admitting: Family Medicine

## 2020-12-06 VITALS — BP 106/57 | HR 77 | Temp 97.5°F | Resp 17 | Ht 68.0 in | Wt 184.4 lb

## 2020-12-06 DIAGNOSIS — N1832 Chronic kidney disease, stage 3b: Secondary | ICD-10-CM

## 2020-12-06 DIAGNOSIS — N183 Chronic kidney disease, stage 3 unspecified: Secondary | ICD-10-CM | POA: Diagnosis not present

## 2020-12-06 DIAGNOSIS — I129 Hypertensive chronic kidney disease with stage 1 through stage 4 chronic kidney disease, or unspecified chronic kidney disease: Secondary | ICD-10-CM | POA: Diagnosis not present

## 2020-12-06 NOTE — Patient Instructions (Addendum)
Thank you for coming to the office today.  Stop taking the Lisinopril for now.  Keep taking the Amlodipine  Stay well hydrated  Avoid Ibuprofen, Aleve, Advil, BC powder  Referral to Nephrology kidney specialist.  [facility]) [address] Professional [address] [place], [place] [address] Phone: [phone]   Please schedule a Follow-up Appointment to: Return in about 3 months (around 03/07/2021) for 3 month follow-up HTN CKD / f/u nephro.  If you have any other questions or concerns, please feel free to call the office or send a message through MyChart. You may also schedule an earlier appointment if necessary.  Additionally, you may be receiving a survey about your experience at our office within a few days to 1 week by e-mail or mail. We value your feedback.  Saralyn Pilar, DO [facility], [place]

## 2020-12-06 NOTE — Progress Notes (Signed)
Subjective:    Patient ID: LOT MEDFORD, male    DOB: 1956/01/27, 65 y.o.   MRN: [id number]  [name] is a 65 y.o. male presenting on 12/06/2020 for Hyperlipidemia, Hypertension, and Leg Swelling (Pt was seen in the office last week by [facility] for Right Lower Extremity Swelling, Redness and Rash. The pt took a prednisone and notice the swelling and rash improved. )   HPI  Right Lower Extremity, redness/rash / swelling Was treated by Webb Silversmith, FNP recently given Prednisone and rash and swelling has improved, also given topical clobetasol with improvement. He has had some swelling in lower extremity, appears to be improved still has some pitting edema. No warmth or redness now. Non tender. Additional lab review showed mild elevated CRP and ANA otherwise unremarkable work up  CHRONIC HTN with CKD-IIIb History of Nephrolithiasis  Lab with elevated creatinine to 1.8, prior baseline 1.4 to 1.6  He has history of CKD in past. No chronic use NSAID. He has had recurrent kidney stones in past, some significant ones Checking BP at home - BP 110-120 / 70-80s, still very well controlled - similar to readings last visit. Current Meds - Lisinopril 26m daily , Amlodipine 525m Reports good compliance, took meds today. Tolerating well, w/o complaints. Denies CP, dyspnea, HA, edema, dizziness / lightheadedness   Pre-Diabetes A1c 6.0 CBGs: not checking Meds: not on med Was on ACEi Lifestyle: Diet improved - Exercise (limited) Denies hypoglycemia, polyuria, visual changes, numbness or tingling.    [facility] 11/25/2020 11/24/2019 11/24/2019  Decreased Interest 0 0 0  Down, Depressed, Hopeless 0 0 0  PHQ - 2 Score 0 0 0  Altered sleeping 0 - -  Tired, decreased energy 1 - -  Change in appetite 0 - -  Feeling bad or failure about yourself  0 - -  Trouble concentrating 0 - -  Moving slowly or fidgety/restless 0 - -  Suicidal thoughts 0 - -  PHQ-9 Score 1 - -  Difficult  doing work/chores Not difficult at all - -    Social History   Tobacco Use   Smoking status: Never   Smokeless tobacco: Never  Vaping Use   Vaping Use: Never used  Substance Use Topics   Alcohol use: Yes    Alcohol/week: 2.0 standard drinks    Types: 2 Glasses of wine per week    Comment: once a month   Drug use: No    Review of Systems Per HPI unless specifically indicated above     Objective:    BP (!) 106/57 (BP Location: Right Arm, Patient Position: Sitting, Cuff Size: Normal)   Pulse 77   Temp (!) 97.5 F (36.4 C) (Temporal)   Resp 17   Ht '5\' 8"'  (1.727 m)   Wt 184 lb 6.4 oz (83.6 kg)   SpO2 98%   BMI 28.04 kg/m   Wt Readings from Last 3 Encounters:  12/06/20 184 lb 6.4 oz (83.6 kg)  11/25/20 182 lb (82.6 kg)  05/29/20 179 lb (81.2 kg)    Physical Exam Vitals and nursing note reviewed.  Constitutional:      General: He is not in acute distress.    Appearance: Normal appearance. He is well-developed. He is not diaphoretic.     Comments: Well-appearing, comfortable, cooperative  HENT:     Head: Normocephalic and atraumatic.     Comments: deaf Eyes:     General:  Right eye: No discharge.        Left eye: No discharge.     Conjunctiva/sclera: Conjunctivae normal.  Cardiovascular:     Rate and Rhythm: Normal rate.  Pulmonary:     Effort: Pulmonary effort is normal.  Skin:    General: Skin is warm and dry.     Findings: No erythema or rash.  Neurological:     Mental Status: He is alert and oriented to person, place, and time.  Psychiatric:        Mood and Affect: Mood normal.        Behavior: Behavior normal.        Thought Content: Thought content normal.     Comments: Well groomed, good eye contact, normal speech and thoughts     Results for orders placed or performed in visit on 11/25/20  Sed Rate (ESR)  Result Value Ref Range   Sed Rate 17 0 - 20 mm/h  Antinuclear Antib (ANA)  Result Value Ref Range   Anti Nuclear Antibody (ANA)  POSITIVE (A) NEGATIVE  C-reactive protein  Result Value Ref Range   CRP 8.0 (H) <8.0 mg/L  C3 and C4  Result Value Ref Range   C3 Complement 137 82 - 185 mg/dL   C4 Complement 28 15 - 53 mg/dL  Antiphospholipid Syndrome Diagnostic Panel  Result Value Ref Range   Anticardiolipin IgG <2.0 <20.0 GPL-U/mL   Anticardiolipin IgM 5.3 <20.0 MPL-U/mL   Anticardiolipin IgA 3.2 <20.0 APL-U/mL   Lupus Anticoagulant see note    PTT-LA Screen 34 <=40 sec   dRVVT 41 <=45 sec   PT, Mixing Interp Not Indicated    Beta-2 Glyco I IgG <2.0 <20.0 U/mL   Beta-2 Glyco 1 IgA 3.5 <20.0 U/mL   Beta-2 Glyco 1 IgM 6.7 <20.0 U/mL  Anti-DNA antibody, double-stranded  Result Value Ref Range   ds DNA Ab 4 IU/mL  CBC  Result Value Ref Range   WBC 8.9 3.8 - 10.8 Thousand/uL   RBC 5.22 4.20 - 5.80 Million/uL   Hemoglobin 15.6 13.2 - 17.1 g/dL   HCT 47.3 38.5 - 50.0 %   MCV 90.6 80.0 - 100.0 fL   MCH 29.9 27.0 - 33.0 pg   MCHC 33.0 32.0 - 36.0 g/dL   RDW 12.1 11.0 - 15.0 %   Platelets 248 140 - 400 Thousand/uL   MPV 9.6 7.5 - 12.5 fL  Hemoglobin A1c  Result Value Ref Range   Hgb A1c MFr Bld 6.0 (H) <5.7 % of total Hgb   Mean Plasma Glucose 126 mg/dL   eAG (mmol/L) 7.0 mmol/L  BASIC METABOLIC PANEL WITH GFR  Result Value Ref Range   Glucose, Bld 95 65 - 139 mg/dL   BUN 30 (H) 7 - 25 mg/dL   Creat 1.80 (H) 0.70 - 1.35 mg/dL   eGFR 42 (L) > OR = 60 mL/min/1.94m   BUN/Creatinine Ratio 17 6 - 22 (calc)   Sodium 140 135 - 146 mmol/L   Potassium 4.4 3.5 - 5.3 mmol/L   Chloride 105 98 - 110 mmol/L   CO2 25 20 - 32 mmol/L   Calcium 9.4 8.6 - 10.3 mg/dL  Anti-nuclear ab-titer (ANA titer)  Result Value Ref Range   ANA Titer 1 1:80 (H) titer   ANA Pattern 1 Nuclear, Speckled (A)       Assessment & Plan:   Problem List Items Addressed This Visit     CKD (chronic kidney disease), stage III ([place]  Relevant Orders   Ambulatory referral to Nephrology   Benign hypertension with CKD (chronic kidney  disease) stage III ([place]) - Primary   Relevant Orders   Ambulatory referral to Nephrology   HTN w/ CKD progression to III b  Concern with elevated Creatinine on last lab.  Referral to Nephrology for evaluation of elevated Creatinine 1.8, prior history of CKD-III 1.4 to 1.6 baseline previously, now gradual decline, he has history of HTN and managed on medications  HOLD ACEi Lisinopril 75m today. Remain off until see Nephro  Continue amlodipine for now. BP controlled.  Keep on improved fluid intake. Avoid NSAIDs, he seems to be avoiding these regardless.  #Lower Ext Edema / Inflammation May be related to reduced kidney function or venous stasis with pitting edema lower ext Now improved. Has healing skin. Resolved prednisone, on topical steroid PRN for dry skin eczema flare Reviewed labs, will hold on further work up for right now, as inflammation is resolving. Will pursue nephro first and follow-up the autoimmune work up if indicated.   No orders of the defined types were placed in this encounter.  Orders Placed This Encounter  Procedures   Ambulatory referral to Nephrology    Referral Priority:   Routine    Referral Type:   Consultation    Referral Reason:   Specialty Services Required    Requested Specialty:   Nephrology    Number of Visits Requested:   1      Follow up plan: Return in about 3 months (around 03/07/2021) for 3 month follow-up HTN CKD / f/u nephro.   ANobie Putnam [facility] 12/06/2020, 4:32 PM

## 2020-12-30 ENCOUNTER — Encounter: Payer: Self-pay | Admitting: Family Medicine

## 2020-12-31 ENCOUNTER — Other Ambulatory Visit: Payer: Self-pay | Admitting: Nephrology

## 2020-12-31 DIAGNOSIS — N1832 Chronic kidney disease, stage 3b: Secondary | ICD-10-CM | POA: Diagnosis not present

## 2020-12-31 DIAGNOSIS — I10 Essential (primary) hypertension: Secondary | ICD-10-CM | POA: Diagnosis not present

## 2021-01-08 ENCOUNTER — Other Ambulatory Visit: Payer: Self-pay

## 2021-01-08 ENCOUNTER — Ambulatory Visit (HOSPITAL_COMMUNITY)
Admission: RE | Admit: 2021-01-08 | Discharge: 2021-01-08 | Disposition: A | Discharge status: Home / Self Care | Payer: BC Managed Care – PPO | Source: Ambulatory Visit | Attending: Nephrology | Admitting: Nephrology

## 2021-01-08 DIAGNOSIS — N1832 Chronic kidney disease, stage 3b: Secondary | ICD-10-CM | POA: Insufficient documentation

## 2021-01-08 DIAGNOSIS — N281 Cyst of kidney, acquired: Secondary | ICD-10-CM | POA: Diagnosis not present

## 2021-01-08 DIAGNOSIS — N1330 Unspecified hydronephrosis: Secondary | ICD-10-CM | POA: Diagnosis not present

## 2021-01-28 ENCOUNTER — Other Ambulatory Visit (HOSPITAL_BASED_OUTPATIENT_CLINIC_OR_DEPARTMENT_OTHER): Payer: Self-pay | Admitting: Nephrology

## 2021-01-28 ENCOUNTER — Other Ambulatory Visit: Payer: Self-pay | Admitting: Nephrology

## 2021-01-28 DIAGNOSIS — I10 Essential (primary) hypertension: Secondary | ICD-10-CM

## 2021-01-28 DIAGNOSIS — N1832 Chronic kidney disease, stage 3b: Secondary | ICD-10-CM

## 2021-01-30 DIAGNOSIS — R809 Proteinuria, unspecified: Secondary | ICD-10-CM | POA: Diagnosis not present

## 2021-01-30 DIAGNOSIS — N1831 Chronic kidney disease, stage 3a: Secondary | ICD-10-CM | POA: Diagnosis not present

## 2021-01-30 DIAGNOSIS — I10 Essential (primary) hypertension: Secondary | ICD-10-CM | POA: Diagnosis not present

## 2021-01-30 DIAGNOSIS — N1330 Unspecified hydronephrosis: Secondary | ICD-10-CM | POA: Diagnosis not present

## 2021-01-30 DIAGNOSIS — N1832 Chronic kidney disease, stage 3b: Secondary | ICD-10-CM | POA: Diagnosis not present

## 2021-02-26 ENCOUNTER — Ambulatory Visit
Admission: RE | Admit: 2021-02-26 | Discharge: 2021-02-26 | Disposition: A | Discharge status: Home / Self Care | Payer: BC Managed Care – PPO | Source: Ambulatory Visit | Attending: Nephrology | Admitting: Nephrology

## 2021-02-26 ENCOUNTER — Other Ambulatory Visit: Payer: Self-pay

## 2021-02-26 DIAGNOSIS — N1832 Chronic kidney disease, stage 3b: Secondary | ICD-10-CM | POA: Diagnosis not present

## 2021-02-26 DIAGNOSIS — N1330 Unspecified hydronephrosis: Secondary | ICD-10-CM | POA: Diagnosis not present

## 2021-02-26 DIAGNOSIS — K449 Diaphragmatic hernia without obstruction or gangrene: Secondary | ICD-10-CM | POA: Diagnosis not present

## 2021-02-26 DIAGNOSIS — I10 Essential (primary) hypertension: Secondary | ICD-10-CM | POA: Insufficient documentation

## 2021-02-26 DIAGNOSIS — K5730 Diverticulosis of large intestine without perforation or abscess without bleeding: Secondary | ICD-10-CM | POA: Diagnosis not present

## 2021-02-26 DIAGNOSIS — N261 Atrophy of kidney (terminal): Secondary | ICD-10-CM | POA: Diagnosis not present

## 2021-03-07 ENCOUNTER — Ambulatory Visit: Payer: BC Managed Care – PPO | Admitting: Family Medicine

## 2021-03-14 ENCOUNTER — Other Ambulatory Visit: Payer: Self-pay

## 2021-03-14 ENCOUNTER — Ambulatory Visit (INDEPENDENT_AMBULATORY_CARE_PROVIDER_SITE_OTHER): Payer: BC Managed Care – PPO | Admitting: Family Medicine

## 2021-03-14 VITALS — BP 132/76 | HR 75 | Wt 188.2 lb

## 2021-03-14 DIAGNOSIS — N261 Atrophy of kidney (terminal): Secondary | ICD-10-CM

## 2021-03-14 DIAGNOSIS — N183 Chronic kidney disease, stage 3 unspecified: Secondary | ICD-10-CM

## 2021-03-14 DIAGNOSIS — I129 Hypertensive chronic kidney disease with stage 1 through stage 4 chronic kidney disease, or unspecified chronic kidney disease: Secondary | ICD-10-CM | POA: Diagnosis not present

## 2021-03-14 DIAGNOSIS — N2889 Other specified disorders of kidney and ureter: Secondary | ICD-10-CM

## 2021-03-14 DIAGNOSIS — N1330 Unspecified hydronephrosis: Secondary | ICD-10-CM

## 2021-03-14 DIAGNOSIS — N1832 Chronic kidney disease, stage 3b: Secondary | ICD-10-CM | POA: Diagnosis not present

## 2021-03-14 NOTE — Progress Notes (Signed)
Subjective:    Patient ID: [name], male    DOB: 1955/05/06, 65 y.o.   MRN: [id number]  [name] is a 65 y.o. male presenting on 03/14/2021 for Hypertension and Chronic Kidney Disease   HPI  CHRONIC HTN with CKD-IIIb History of R Nephrolithiasis (prior 78m stone that required lithotripsy and ureteroscopy) R Renal Atrophy (history hydronephrosis) L renal hydronephrosis / Pelvicaliectasis  Since last visit with me 11/2020 referred to Nephrology for HTN and worsening CKD  Established with Dr LCharline BillsNephrology. 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  Has upcoming apt with Urologist in 12/29 now as new referral.  Labs with Cr down to 1.64 some improvement and stable overall.   He has history of CKD in past. No chronic use NSAID. He has had recurrent kidney stones in past, some significant ones  His ACEi was swapped to Losartan. BP improved Current Meds - Losartan 543mdaily, Amlodipine 25m43maily Reports good compliance, took meds today. Tolerating well, w/o complaints. Denies CP, dyspnea, HA, edema, dizziness / lightheadedness    [facility] 03/14/2021 11/25/2020 11/24/2019  Decreased Interest 0 0 0  Down, Depressed, Hopeless 0 0 0  PHQ - 2 Score 0 0 0  Altered sleeping 0 0 -  Tired, decreased energy 0 1 -  Change in appetite 0 0 -  Feeling bad or failure about yourself  0 0 -  Trouble concentrating 0 0 -  Moving slowly or fidgety/restless 0 0 -  Suicidal thoughts 0 0 -  PHQ-9 Score 0 1 -  Difficult doing work/chores Not difficult at all Not difficult at all -    Social History   Tobacco Use   Smoking status: Never   Smokeless tobacco: Never  Vaping Use   Vaping Use: Never used  Substance Use Topics   Alcohol use: Yes    Alcohol/week: 2.0  standard drinks    Types: 2 Glasses of wine per week    Comment: once a month   Drug use: No    Review of Systems Per HPI unless specifically indicated above     Objective:    BP 132/76    Pulse 75    Wt 188 lb 3.2 oz (85.4 kg)    SpO2 97%    BMI 28.62 kg/m   Wt Readings from Last 3 Encounters:  03/14/21 188 lb 3.2 oz (85.4 kg)  12/06/20 184 lb 6.4 oz (83.6 kg)  11/25/20 182 lb (82.6 kg)    Physical Exam Vitals and nursing note reviewed.  Constitutional:      General: He is not in acute distress.    Appearance: Normal appearance. He is well-developed. He is not diaphoretic.     Comments: Well-appearing, comfortable, cooperative  HENT:     Head: Normocephalic and atraumatic.  Eyes:     General:        Right eye: No discharge.        Left eye: No discharge.     Conjunctiva/sclera: Conjunctivae normal.  Cardiovascular:     Rate and Rhythm: Normal rate.  Pulmonary:     Effort: Pulmonary effort is normal.  Skin:    General: Skin is warm and dry.     Findings: No erythema or rash.  Neurological:  Mental Status: He is alert and oriented to person, place, and time.  Psychiatric:        Mood and Affect: Mood normal.        Behavior: Behavior normal.        Thought Content: Thought content normal.     Comments: Well groomed, good eye contact, normal speech and thoughts    I have personally reviewed the radiology report from 02/26/21 on CT Abd Pelvis.  CLINICAL DATA:  Abnormal lab results.  Followup.   EXAM: CT ABDOMEN AND PELVIS WITHOUT CONTRAST   TECHNIQUE: Multidetector CT imaging of the abdomen and pelvis was performed following the standard protocol without IV contrast.   COMPARISON:  U/S renal 01/08/2021. Report from [facility] renal scan from 08/31/2008 and report from CT AP 09/05/2002.   FINDINGS: Lower chest: Scarring noted within the posterior lung bases. No acute abnormality identified at this time.   Hepatobiliary: No focal liver abnormality is seen. No  gallstones, gallbladder wall thickening, or biliary dilatation.   Pancreas: Unremarkable. No pancreatic ductal dilatation or surrounding inflammatory changes.   Spleen: Normal in size without focal abnormality.   Adrenals/Urinary Tract: Normal adrenal glands. There is diffuse scratch set markedly diffuse right renal cortical atrophy. Severe right hydronephrosis is noted. The dilated right renal collecting system measures 8.7 x 8.1 cm. Calcifications are noted within the lower pole. The right ureter is nondilated.   Left-sided pelvocaliectasis is identified. No left-sided nephrolithiasis. No significant left hydroureter or ureteral lithiasis. Bladder is unremarkable.   Stomach/Bowel: Small hiatal hernia. The appendix is visualized and appears normal. No bowel wall thickening, inflammation, or distension. Sigmoid diverticulosis without signs of acute diverticulitis.   Vascular/Lymphatic: No signs of abdominopelvic adenopathy.   Reproductive: Prostate is unremarkable.   Other: No free fluid or fluid collections.   Musculoskeletal: No acute or significant osseous findings. Spondylosis noted within the lumbar spine. No acute or suspicious osseous findings.   IMPRESSION: 1. Marked diffuse right renal cortical atrophy. Signs of chronic severe right hydronephrosis identified with normal caliber right ureter. 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 Calcifications are noted within the dependent portion of the dilated right renal collecting system. 2. Left-sided pelvocaliectasis. No left-sided nephrolithiasis or ureteral lithiasis identified. 3. Small hiatal hernia. 4. Sigmoid diverticulosis without signs of acute diverticulitis.     Electronically Signed   By: Kerby Moors M.D.   On: 02/27/2021 14:35  Results for orders placed or performed in visit on 11/25/20  Sed Rate (ESR)  Result Value Ref Range   Sed Rate 17 0 - 20 mm/h   Antinuclear Antib (ANA)  Result Value Ref Range   Anti Nuclear Antibody (ANA) POSITIVE (A) NEGATIVE  C-reactive protein  Result Value Ref Range   CRP 8.0 (H) <8.0 mg/L  C3 and C4  Result Value Ref Range   C3 Complement 137 82 - 185 mg/dL   C4 Complement 28 15 - 53 mg/dL  Antiphospholipid Syndrome Diagnostic Panel  Result Value Ref Range   Anticardiolipin IgG <2.0 <20.0 GPL-U/mL   Anticardiolipin IgM 5.3 <20.0 MPL-U/mL   Anticardiolipin IgA 3.2 <20.0 APL-U/mL   Lupus Anticoagulant see note    PTT-LA Screen 34 <=40 sec   dRVVT 41 <=45 sec   PT, Mixing Interp Not Indicated    Beta-2 Glyco I IgG <2.0 <20.0 U/mL   Beta-2 Glyco 1 IgA 3.5 <20.0 U/mL   Beta-2 Glyco 1 IgM 6.7 <20.0 U/mL  Anti-DNA antibody, double-stranded  Result  Value Ref Range   ds DNA Ab 4 IU/mL  CBC  Result Value Ref Range   WBC 8.9 3.8 - 10.8 Thousand/uL   RBC 5.22 4.20 - 5.80 Million/uL   Hemoglobin 15.6 13.2 - 17.1 g/dL   HCT 47.3 38.5 - 50.0 %   MCV 90.6 80.0 - 100.0 fL   MCH 29.9 27.0 - 33.0 pg   MCHC 33.0 32.0 - 36.0 g/dL   RDW 12.1 11.0 - 15.0 %   Platelets 248 140 - 400 Thousand/uL   MPV 9.6 7.5 - 12.5 fL  Hemoglobin A1c  Result Value Ref Range   Hgb A1c MFr Bld 6.0 (H) <5.7 % of total Hgb   Mean Plasma Glucose 126 mg/dL   eAG (mmol/L) 7.0 mmol/L  BASIC METABOLIC PANEL WITH GFR  Result Value Ref Range   Glucose, Bld 95 65 - 139 mg/dL   BUN 30 (H) 7 - 25 mg/dL   Creat 1.80 (H) 0.70 - 1.35 mg/dL   eGFR 42 (L) > OR = 60 mL/min/1.79m   BUN/Creatinine Ratio 17 6 - 22 (calc)   Sodium 140 135 - 146 mmol/L   Potassium 4.4 3.5 - 5.3 mmol/L   Chloride 105 98 - 110 mmol/L   CO2 25 20 - 32 mmol/L   Calcium 9.4 8.6 - 10.3 mg/dL  Anti-nuclear ab-titer (ANA titer)  Result Value Ref Range   ANA Titer 1 1:80 (H) titer   ANA Pattern 1 Nuclear, Speckled (A)       Assessment & Plan:   Problem List Items Addressed This Visit     CKD (chronic kidney disease), stage III (HCC)   Benign hypertension with  CKD (chronic kidney disease) stage III (HCC) - Primary   Relevant Medications   losartan (COZAAR) 50 MG tablet   Other Visit Diagnoses     Right renal atrophy       Pelvicaliectasis       Hydronephrosis of left kidney           Reviewed Nephrology notes, labs, testing and imaging, see above.  Improved HTN control. BP normal range controlled today Continue current regimen. 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  No orders of the defined types were placed in this encounter.    Follow up plan: Return in about 4 months (around 07/13/2021) for 4 month Annual Physical PM apt (future fasting labs after).   ANobie Putnam [facility] 03/14/2021, 3:38 PM

## 2021-03-14 NOTE — Patient Instructions (Addendum)
Thank you for coming to the office today.  Keep up with scheduled plan with Dr Lonna Cobb Urology - reviewed the CT  Looks like the Right Kidney has had chronic problem likely due to the history of kidney stones.  Left kidney seems to have cyst and some dilated swelling or abnormality - that likely needs to be looked at.  We will see the report from Urologist and learn more about the scenario.  I do not believe that this is urgent. But we need to keep learning.  If curious on blood type you may donate blood to find out type.  Keep an eye on BP Continue current meds.  Please schedule a Follow-up Appointment to: Return in about 4 months (around 07/13/2021) for 4 month Annual Physical PM apt (future fasting labs after).  If you have any other questions or concerns, please feel free to call the office or send a message through MyChart. You may also schedule an earlier appointment if necessary.  Additionally, you may be receiving a survey about your experience at our office within a few days to 1 week by e-mail or mail. We value your feedback.  Saralyn Pilar, DO [facility], [place]

## 2021-03-27 ENCOUNTER — Other Ambulatory Visit: Payer: Self-pay

## 2021-03-27 ENCOUNTER — Ambulatory Visit (INDEPENDENT_AMBULATORY_CARE_PROVIDER_SITE_OTHER): Payer: BC Managed Care – PPO | Admitting: Urology

## 2021-03-27 ENCOUNTER — Encounter: Payer: Self-pay | Admitting: Urology

## 2021-03-27 VITALS — BP 168/104 | HR 94 | Ht 68.0 in | Wt 182.0 lb

## 2021-03-27 DIAGNOSIS — N1330 Unspecified hydronephrosis: Secondary | ICD-10-CM

## 2021-03-27 NOTE — Progress Notes (Signed)
03/27/2021 1:50 PM   [name] November 13, 1955 [id number]  Referring provider: Anthonette Legato, MD [place],  [place] [address]  Chief Complaint  Patient presents with   Hydronephrosis    HPI: [name] is a 65 y.o. male referred for evaluation of hydronephrosis.  Saw Dr. Holley Raring 12/31/2020 for stage IIIb chronic kidney disease Renal ultrasound was initially performed which was felt to show mild left hydronephrosis and a right renal cyst Noncontrast CT subsequently performed which showed severe right hydronephrosis without hydroureter and a very thin rim of renal parenchyma consistent with longstanding obstruction and mild left pelviectasis without definite hydronephrosis Has a prior history of recurrent stone disease including ureteroscopy/stent on the right around 2006 so unlikely congenital in etiology Denies flank, abdominal or pelvic pain Denies gross hematuria Only voiding complaint is nocturia several times per night which he relates to his fluid intake.   PMH: Past Medical History:  Diagnosis Date   Allergy    Asthma    Chronic kidney disease    GERD (gastroesophageal reflux disease)    Headache    Hypertension    TGA (transient global amnesia)     Surgical History: Past Surgical History:  Procedure Laterality Date   COLONOSCOPY WITH PROPOFOL N/A 12/04/2015   Procedure: COLONOSCOPY WITH PROPOFOL;  Surgeon: Manya Silvas, MD;  Location: [facility];  Service: Endoscopy;  Laterality: N/A;   LITHOTRIPSY     URETEROSCOPY      Home Medications:  Allergies as of 03/27/2021       Reactions   Seasonal Ic [cholestatin]         Medication List        Accurate as of March 27, 2021  1:50 PM. If you have any questions, ask your nurse or doctor.          STOP taking these medications    clobetasol ointment 0.05 % Commonly known as: TEMOVATE Stopped by: Abbie Sons, MD       TAKE these medications    albuterol 108 (90  Base) MCG/ACT inhaler Commonly known as: VENTOLIN HFA Inhale into the lungs every 6 (six) hours as needed for wheezing or shortness of breath.   amLODipine 5 MG tablet Commonly known as: NORVASC Take 1 tablet (5 mg total) by mouth daily.   aspirin 81 MG EC tablet Take 1 tablet (81 mg total) by mouth daily.   losartan 50 MG tablet Commonly known as: COZAAR Take 50 mg by mouth daily.   omeprazole 40 MG capsule Commonly known as: PRILOSEC Take by mouth.   tamsulosin 0.4 MG Caps capsule Commonly known as: FLOMAX Take 1 capsule (0.4 mg total) by mouth every evening.        Allergies:  Allergies  Allergen Reactions   Seasonal Ic [Cholestatin]     Family History: Family History  Problem Relation Age of Onset   COPD Mother    CAD Mother    Alzheimer's disease Father    Diabetes Father     Social History:  reports that he has never smoked. He has never used smokeless tobacco. He reports current alcohol use of about 2.0 standard drinks per week. He reports that he does not use drugs.   Physical Exam: BP (!) 168/104    Pulse 94    Ht 5\' 8"  (1.727 m)    Wt 182 lb (82.6 kg)    BMI 27.67 kg/m   Constitutional:  Alert, No acute distress. HEENT:   AT, moist mucus membranes.  Trachea midline, no masses. Cardiovascular: No clubbing, cyanosis, or edema. Respiratory: Normal respiratory effort, no increased work of breathing. Psychiatric: Normal mood and affect.   Pertinent Imaging: CT images were personally reviewed and interpreted.  Extremely thin renal parenchyma and most likely a nonfunctioning kidney   Assessment & Plan:   65 y.o. male with severe right hydronephrosis and probable nonfunctioning kidney most likely secondary to chronic obstruction Mild left pelviectasis without definite hydronephrosis Recommend scheduling Lasix renal scan Will contact with results   Riki Altes, MD  [facility] [address] [place], [place] [address] [phone]

## 2021-04-04 ENCOUNTER — Other Ambulatory Visit: Payer: Self-pay

## 2021-04-04 ENCOUNTER — Encounter
Admission: RE | Admit: 2021-04-04 | Discharge: 2021-04-04 | Disposition: A | Discharge status: Home / Self Care | Payer: BC Managed Care – PPO | Source: Ambulatory Visit | Attending: Urology | Admitting: Urology

## 2021-04-04 ENCOUNTER — Other Ambulatory Visit: Payer: Self-pay | Admitting: Urology

## 2021-04-04 ENCOUNTER — Ambulatory Visit: Payer: BC Managed Care – PPO

## 2021-04-04 DIAGNOSIS — N1330 Unspecified hydronephrosis: Secondary | ICD-10-CM | POA: Diagnosis not present

## 2021-04-04 MED ORDER — TECHNETIUM TC 99M MERTIATIDE
5.0000 | Freq: Once | INTRAVENOUS | Status: AC | PRN
  Administered 2021-04-04: 5.29 via INTRAVENOUS

## 2021-04-04 MED ORDER — FUROSEMIDE 10 MG/ML IJ SOLN
41.3000 mg | Freq: Once | INTRAMUSCULAR | Status: DC
  Filled 2021-04-04: qty 4.1

## 2021-04-07 ENCOUNTER — Encounter: Payer: Self-pay | Admitting: Urology

## 2021-05-29 DIAGNOSIS — R809 Proteinuria, unspecified: Secondary | ICD-10-CM | POA: Diagnosis not present

## 2021-05-29 DIAGNOSIS — N1330 Unspecified hydronephrosis: Secondary | ICD-10-CM | POA: Diagnosis not present

## 2021-05-29 DIAGNOSIS — N1831 Chronic kidney disease, stage 3a: Secondary | ICD-10-CM | POA: Diagnosis not present

## 2021-05-29 DIAGNOSIS — I10 Essential (primary) hypertension: Secondary | ICD-10-CM | POA: Diagnosis not present

## 2021-06-22 ENCOUNTER — Other Ambulatory Visit: Payer: Self-pay | Admitting: Family Medicine

## 2021-06-22 DIAGNOSIS — Z87442 Personal history of urinary calculi: Secondary | ICD-10-CM

## 2021-06-24 NOTE — Telephone Encounter (Signed)
Requested medication (s) are due for refill today: yes ? ?Requested medication (s) are on the active medication list: yes ? ?Last refill:  05/29/20 #90 with 3 RF ? ?Future visit scheduled: 07/01/21 ? ?Notes to clinic:  Failed protocol of labs within PSA from 05/23/2020, has upcoming appt, please assess. ? ? ? ?  ? ?Requested Prescriptions  ?Pending Prescriptions Disp Refills  ? tamsulosin (FLOMAX) 0.4 MG CAPS capsule [Pharmacy Med Name: TAMSULOSIN HCL 0.4 MG CAPSULE] 90 capsule 3  ?  Sig: TAKE 1 CAPSULE BY MOUTH EVERY EVENING  ?  ? Urology: Alpha-Adrenergic Blocker Failed - 06/22/2021  1:44 AM  ?  ?  Failed - PSA in normal range and within 360 days  ?  PSA  ?Date Value Ref Range Status  ?05/23/2020 1.90 < OR = 4.0 ng/mL Final  ?  Comment:  ?  The total PSA value from this assay system is  ?standardized against the WHO standard. The test  ?result will be approximately 20% lower when compared  ?to the equimolar-standardized total PSA (Beckman  ?Coulter). Comparison of serial PSA results should be  ?interpreted with this fact in mind. ?. ?This test was performed using the Siemens  ?chemiluminescent method. Values obtained from  ?different assay methods cannot be used ?interchangeably. PSA levels, regardless of ?value, should not be interpreted as absolute ?evidence of the presence or absence of disease. ?  ?  ?  ?  ?  Failed - Last BP in normal range  ?  BP Readings from Last 1 Encounters:  ?03/27/21 (!) 168/104  ?  ?  ?  ?  Passed - Valid encounter within last 12 months  ?  Recent Outpatient Visits   ? ?      ? 3 months ago Benign hypertension with CKD (chronic kidney disease) stage III (HCC)  ? [place], DO  ? 6 months ago Benign hypertension with CKD (chronic kidney disease) stage III ([place])  ? [place], DO  ? 7 months ago Rash and nonspecific skin eruption  ? [facility], Coralie Keens, NP  ? 1 year ago Annual physical  exam  ? [place], DO  ? 1 year ago Acute non-recurrent frontal sinusitis  ? [place], DO  ? ?  ?  ?Future Appointments   ? ?        ? In 1 week [place] Devonne Doughty, DO [facility], [place]  ? ?  ? ?  ?  ?  ? ? ?

## 2021-07-01 ENCOUNTER — Other Ambulatory Visit: Payer: Self-pay | Admitting: Family Medicine

## 2021-07-01 ENCOUNTER — Ambulatory Visit (INDEPENDENT_AMBULATORY_CARE_PROVIDER_SITE_OTHER): Payer: BC Managed Care – PPO | Admitting: Family Medicine

## 2021-07-01 ENCOUNTER — Encounter: Payer: Self-pay | Admitting: Family Medicine

## 2021-07-01 VITALS — BP 124/68 | HR 73 | Ht 69.0 in | Wt 193.6 lb

## 2021-07-01 DIAGNOSIS — Z0000 Encounter for general adult medical examination without abnormal findings: Secondary | ICD-10-CM | POA: Diagnosis not present

## 2021-07-01 DIAGNOSIS — E789 Disorder of lipoprotein metabolism, unspecified: Secondary | ICD-10-CM

## 2021-07-01 DIAGNOSIS — N1832 Chronic kidney disease, stage 3b: Secondary | ICD-10-CM

## 2021-07-01 DIAGNOSIS — I129 Hypertensive chronic kidney disease with stage 1 through stage 4 chronic kidney disease, or unspecified chronic kidney disease: Secondary | ICD-10-CM

## 2021-07-01 DIAGNOSIS — E663 Overweight: Secondary | ICD-10-CM

## 2021-07-01 DIAGNOSIS — R7303 Prediabetes: Secondary | ICD-10-CM

## 2021-07-01 DIAGNOSIS — N183 Chronic kidney disease, stage 3 unspecified: Secondary | ICD-10-CM

## 2021-07-01 DIAGNOSIS — N401 Enlarged prostate with lower urinary tract symptoms: Secondary | ICD-10-CM

## 2021-07-01 MED ORDER — AMLODIPINE BESYLATE 5 MG PO TABS: 5.0000 mg | ORAL_TABLET | Freq: Every day | ORAL | 3 refills | Status: DC

## 2021-07-01 NOTE — Assessment & Plan Note (Signed)
Stable prior range A1c ?Controlled on lifestyle ?

## 2021-07-01 NOTE — Patient Instructions (Addendum)
Thank you for coming to the office today. ? ?Keep up the good work ? ?Continue Amlodipine 5mg , and Losartan 50mg  daily. BP looks great. ? ?In future - I would recommend a single Pneumonia vaccine (Prevnar-20 - new shot) for rest of your life pneumonia protection (mostly pneumonia in the blood stream, caused by lung infection) ? ?We will skip labs this year and get everything next Spring 2024 - cholesterol, PSA prostate, A1c sugar, and the rest of the test. ? ?DUE for FASTING BLOOD WORK (no food or drink after midnight before the lab appointment, only water or coffee without cream/sugar on the morning of) ? ?SCHEDULE "Lab Only" visit in the morning at the clinic for lab draw in 1 YEAR ? ?- Make sure Lab Only appointment is at about 1 week before your next appointment, so that results will be available ? ?For Lab Results, once available within 2-3 days of blood draw, you can can log in to MyChart online to view your results and a brief explanation. Also, we can discuss results at next follow-up visit. ? ? ? ?Please schedule a Follow-up Appointment to: Return in about 1 year (around 07/02/2022) for 1 year fasting lab only then 1 week later Annual Physical. ? ?If you have any other questions or concerns, please feel free to call the office or send a message through [email or web address]. You may also schedule an earlier appointment if necessary. ? ?Additionally, you may be receiving a survey about your experience at our office within a few days to 1 week by e-mail or mail. We value your feedback. ? ?[name] Putnam, DO ?[place] ?

## 2021-07-01 NOTE — Assessment & Plan Note (Addendum)
Well-controlled HTN ?- Home BP readings reviewed ?Complication with CKD III, R Renal agenesis history hydronephrosis ?Off Metoprolol ?  ? ?Plan: ?1. Continue current Amlodipine 5mg  daily, Lisinopril 10mg  daily ?2. Encourage improved lifestyle - low sodium diet, regular exercise ?3. Keep monitor BP outside office, bring readings to next visit, if persistently >140/90 or new symptoms notify office sooner ?

## 2021-07-01 NOTE — Progress Notes (Signed)
? ?Subjective:  ? ? Patient ID: [name], male    DOB: 1955/08/18, 66 y.o.   MRN: [id number] ? ?[name] is a 66 y.o. male presenting on 07/01/2021 for Annual Exam ? ? ?HPI ? ?Here for Annual Physical and Lab Review. ? ?CHRONIC HTN with CKD-IIIb ?History of R Nephrolithiasis (prior 14mm stone that required lithotripsy and ureteroscopy) ?R Renal Atrophy (history hydronephrosis) ?L renal hydronephrosis / Pelvicaliectasis ? ?Updates ? ?Last visit Urology 02/2021 and Nephrology 05/2021 ? ?Update confirmed: Left kidney is functioning for both. Right has nearly no function. ?  ?Question if kidney stone recently, he increased water, could have had kidney stone and passed. ? ?Last lab Creatinine 1.65 (05/2021), prior range 1.4 to 1.6 over past 5+ years. ? ?He has history of CKD in past. No chronic use NSAID. He has had recurrent kidney stones in past, some significant ones ?  ?His ACEi was swapped to Losartan. ?BP improved ?Current Meds - Losartan 50mg  daily, Amlodipine 5mg  daily ?Reports good compliance, took meds today. Tolerating well, w/o complaints. ?Denies CP, dyspnea, HA, edema, dizziness / lightheadedness ? ? ?Pre-Diabetes ?Prior A1c 5.9 to 6.1 ?Last lab A1c 6.0 ?CBGs: not checking ?Meds: not on med ?Currently on ACEi ?Lifestyle: ?Weight up 10 lbs approx in 4 months ?Diet improved. Improved water intake. ?- Exercise (limited - less exercise but frequent walking) ?Denies hypoglycemia, polyuria, visual changes, numbness or tingling. ?  ?GERD ?Chronic GERD heartburn, controlled on PPI ?Request generic order ?  ?History of Migraine HA ?No more migraines, with BP controlled ?  ?BPH nocturia ?Reports frequent nocturia symptoms, without worsening, no other significant urinary symptoms. Taking Tamsulosin 0.4mg  nightly ?Improved significantly with less nocturia ?  ?PMH Nephrolithiasis ?  ?  ?Health Maintenance: ?  ?Due for Flu Shot, declines today despite counseling on benefits ?  ?Declines COVID19 vaccine for future ?   ?PSA 1.9 (05/2020) negative. ? ? ?  07/01/2021  ?  3:53 PM 03/14/2021  ?  3:24 PM 11/25/2020  ?  3:03 PM  ?Depression screen PHQ 2/9  ?Decreased Interest 0 0 0  ?Down, Depressed, Hopeless 0 0 0  ?PHQ - 2 Score 0 0 0  ?Altered sleeping 0 0 0  ?Tired, decreased energy 0 0 1  ?Change in appetite 0 0 0  ?Feeling bad or failure about yourself  0 0 0  ?Trouble concentrating 0 0 0  ?Moving slowly or fidgety/restless 0 0 0  ?Suicidal thoughts 0 0 0  ?PHQ-9 Score 0 0 1  ?Difficult doing work/chores Not difficult at all Not difficult at all Not difficult at all  ? ? ?Past Medical History:  ?Diagnosis Date  ? Allergy   ? Asthma   ? Chronic kidney disease   ? GERD (gastroesophageal reflux disease)   ? Headache   ? Hypertension   ? TGA (transient global amnesia)   ? ?Past Surgical History:  ?Procedure Laterality Date  ? COLONOSCOPY WITH PROPOFOL N/A 12/04/2015  ? Procedure: COLONOSCOPY WITH PROPOFOL;  Surgeon: 4/9, MD;  Location: [facility] ENDOSCOPY;  Service: Endoscopy;  Laterality: N/A;  ? LITHOTRIPSY    ? URETEROSCOPY    ? ?Social History  ? ?Socioeconomic History  ? Marital status: Married  ?  Spouse name: Not on file  ? Number of children: Not on file  ? Years of education: College  ? Highest education level: Not on file  ?Occupational History  ? Not on file  ?Tobacco Use  ? Smoking status:  Never  ? Smokeless tobacco: Never  ?Vaping Use  ? Vaping Use: Never used  ?Substance and Sexual Activity  ? Alcohol use: Yes  ?  Alcohol/week: 2.0 standard drinks  ?  Types: 2 Glasses of wine per week  ?  Comment: once a month  ? Drug use: No  ? Sexual activity: Not on file  ?Other Topics Concern  ? Not on file  ?Social History Narrative  ? Not on file  ? ?Social Determinants of Health  ? ?Financial Resource Strain: Not on file  ?Food Insecurity: Not on file  ?Transportation Needs: Not on file  ?Physical Activity: Not on file  ?Stress: Not on file  ?Social Connections: Not on file  ?Intimate Partner Violence: Not on file  ? ?Family  History  ?Problem Relation Age of Onset  ? COPD Mother   ? CAD Mother   ? Alzheimer's disease Father   ? Diabetes Father   ? ?Current Outpatient Medications on File Prior to Visit  ?Medication Sig  ? albuterol (VENTOLIN HFA) 108 (90 Base) MCG/ACT inhaler Inhale into the lungs every 6 (six) hours as needed for wheezing or shortness of breath.  ? aspirin EC 81 MG EC tablet Take 1 tablet (81 mg total) by mouth daily.  ? losartan (COZAAR) 50 MG tablet Take 50 mg by mouth daily.  ? omeprazole (PRILOSEC) 40 MG capsule Take by mouth.  ? tamsulosin (FLOMAX) 0.4 MG CAPS capsule TAKE 1 CAPSULE BY MOUTH EVERY EVENING  ? ?No current facility-administered medications on file prior to visit.  ? ? ?Review of Systems  ?Constitutional:  Negative for activity change, appetite change, chills, diaphoresis, fatigue and fever.  ?HENT:  Negative for congestion and hearing loss.   ?Eyes:  Negative for visual disturbance.  ?Respiratory:  Negative for cough, chest tightness, shortness of breath and wheezing.   ?Cardiovascular:  Negative for chest pain, palpitations and leg swelling.  ?Gastrointestinal:  Negative for abdominal pain, constipation, diarrhea, nausea and vomiting.  ?Genitourinary:  Negative for dysuria, frequency and hematuria.  ?Musculoskeletal:  Negative for arthralgias and neck pain.  ?Skin:  Negative for rash.  ?Neurological:  Negative for dizziness, weakness, light-headedness, numbness and headaches.  ?Hematological:  Negative for adenopathy.  ?Psychiatric/Behavioral:  Negative for behavioral problems, dysphoric mood and sleep disturbance.   ?Per HPI unless specifically indicated above ? ? ?   ?Objective:  ?  ?BP 124/68   Pulse 73   Ht 5\' 9"  (1.753 m)   Wt 193 lb 9.6 oz (87.8 kg)   SpO2 97%   BMI 28.59 kg/m?   ?Wt Readings from Last 3 Encounters:  ?07/01/21 193 lb 9.6 oz (87.8 kg)  ?03/27/21 182 lb (82.6 kg)  ?03/14/21 188 lb 3.2 oz (85.4 kg)  ?  ?Physical Exam ?Vitals and nursing note reviewed.  ?Constitutional:   ?    General: He is not in acute distress. ?   Appearance: He is well-developed. He is not diaphoretic.  ?   Comments: Well-appearing, comfortable, cooperative  ?HENT:  ?   Head: Normocephalic and atraumatic.  ?   Ears:  ?   Comments: Stable Hearing loss ?Eyes:  ?   General:     ?   Right eye: No discharge.     ?   Left eye: No discharge.  ?   Conjunctiva/sclera: Conjunctivae normal.  ?   Pupils: Pupils are equal, round, and reactive to light.  ?Neck:  ?   Thyroid: No thyromegaly.  ?Cardiovascular:  ?  Rate and Rhythm: Normal rate and regular rhythm.  ?   Pulses: Normal pulses.  ?   Heart sounds: Normal heart sounds. No murmur heard. ?Pulmonary:  ?   Effort: Pulmonary effort is normal. No respiratory distress.  ?   Breath sounds: Normal breath sounds. No wheezing or rales.  ?Abdominal:  ?   General: Bowel sounds are normal. There is no distension.  ?   Palpations: Abdomen is soft. There is no mass.  ?   Tenderness: There is no abdominal tenderness.  ?Musculoskeletal:     ?   General: No tenderness. Normal range of motion.  ?   Cervical back: Normal range of motion and neck supple.  ?   Comments: Upper / Lower Extremities: ?- Normal muscle tone, strength bilateral upper extremities 5/5, lower extremities 5/5  ?Lymphadenopathy:  ?   Cervical: No cervical adenopathy.  ?Skin: ?   General: Skin is warm and dry.  ?   Findings: No erythema or rash.  ?Neurological:  ?   Mental Status: He is alert and oriented to person, place, and time.  ?   Comments: Distal sensation intact to light touch all extremities  ?Psychiatric:     ?   Mood and Affect: Mood normal.     ?   Behavior: Behavior normal.     ?   Thought Content: Thought content normal.  ?   Comments: Well groomed, good eye contact, normal speech and thoughts  ? ? ?I have personally reviewed the radiology report from 04/04/21 NM RENAL IMAGING FLOW W LASIX. ? ?NM RENAL IMAGING FLOW W/LASIX ?04/04/2021 ?[facility] NUCLEAR [facility] ?Results ? ?Procedure  Component Value Ref Range Date/Time  ?NM Renal Imaging Flow W/Pharm [[id number]][[id number]] Resulted: 04/04/21 1805  ?Order Status: Completed Updated: 04/04/21 1807  ?Narrative:    ?CLINICAL DATA:  RIGHT hydronephrosis, marked h

## 2021-10-01 DIAGNOSIS — R809 Proteinuria, unspecified: Secondary | ICD-10-CM | POA: Diagnosis not present

## 2021-10-01 DIAGNOSIS — N1330 Unspecified hydronephrosis: Secondary | ICD-10-CM | POA: Diagnosis not present

## 2021-10-01 DIAGNOSIS — I10 Essential (primary) hypertension: Secondary | ICD-10-CM | POA: Diagnosis not present

## 2021-10-01 DIAGNOSIS — N1831 Chronic kidney disease, stage 3a: Secondary | ICD-10-CM | POA: Diagnosis not present

## 2021-10-07 DIAGNOSIS — N1330 Unspecified hydronephrosis: Secondary | ICD-10-CM | POA: Diagnosis not present

## 2021-10-07 DIAGNOSIS — N1832 Chronic kidney disease, stage 3b: Secondary | ICD-10-CM | POA: Diagnosis not present

## 2021-10-07 DIAGNOSIS — I10 Essential (primary) hypertension: Secondary | ICD-10-CM | POA: Diagnosis not present

## 2021-10-07 DIAGNOSIS — R809 Proteinuria, unspecified: Secondary | ICD-10-CM | POA: Diagnosis not present

## 2021-12-12 ENCOUNTER — Encounter: Payer: Self-pay | Admitting: Family Medicine

## 2021-12-12 DIAGNOSIS — R21 Rash and other nonspecific skin eruption: Secondary | ICD-10-CM

## 2021-12-12 MED ORDER — CLOBETASOL PROPIONATE 0.05 % EX OINT: 1.0000 | TOPICAL_OINTMENT | Freq: Two times a day (BID) | CUTANEOUS | 2 refills | Status: DC

## 2022-02-03 DIAGNOSIS — R809 Proteinuria, unspecified: Secondary | ICD-10-CM | POA: Diagnosis not present

## 2022-02-03 DIAGNOSIS — I10 Essential (primary) hypertension: Secondary | ICD-10-CM | POA: Diagnosis not present

## 2022-02-03 DIAGNOSIS — N1831 Chronic kidney disease, stage 3a: Secondary | ICD-10-CM | POA: Diagnosis not present

## 2022-05-26 ENCOUNTER — Other Ambulatory Visit: Payer: Self-pay | Admitting: Family Medicine

## 2022-05-26 DIAGNOSIS — Z87442 Personal history of urinary calculi: Secondary | ICD-10-CM

## 2022-05-27 NOTE — Telephone Encounter (Signed)
Unable to refill per protocol, Rx request is too soon. Last refill 06/24/21 for 90 and 3 refills.  Requested Prescriptions  Pending Prescriptions Disp Refills   tamsulosin (FLOMAX) 0.4 MG CAPS capsule [Pharmacy Med Name: TAMSULOSIN HCL 0.4 MG CAPSULE] 90 capsule 3    Sig: TAKE 1 CAPSULE BY MOUTH EVERY DAY IN THE EVENING     Urology: Alpha-Adrenergic Blocker Failed - 05/26/2022 10:25 PM      Failed - PSA in normal range and within 360 days    PSA  Date Value Ref Range Status  05/23/2020 1.90 < OR = 4.0 ng/mL Final    Comment:    The total PSA value from this assay system is  standardized against the WHO standard. The test  result will be approximately 20% lower when compared  to the equimolar-standardized total PSA (Beckman  Coulter). Comparison of serial PSA results should be  interpreted with this fact in mind. . This test was performed using the Siemens  chemiluminescent method. Values obtained from  different assay methods cannot be used interchangeably. PSA levels, regardless of value, should not be interpreted as absolute evidence of the presence or absence of disease.          Passed - Last BP in normal range    BP Readings from Last 1 Encounters:  07/01/21 124/68         Passed - Valid encounter within last 12 months    Recent Outpatient Visits           11 months ago Annual physical exam   [facility] [place], Devonne Doughty, DO   1 year ago Benign hypertension with CKD (chronic kidney disease) stage III [facility])   [facility] Olin Hauser, DO   1 year ago Benign hypertension with CKD (chronic kidney disease) stage III [facility])   [place], DO   1 year ago Rash and nonspecific skin eruption   [facility] [place], Coralie Keens, NP   1 year ago Annual physical exam   [facility] Olin Hauser, DO       Future Appointments             In 1 month [facility] [place], Devonne Doughty, [facility], [facility]

## 2022-06-15 DIAGNOSIS — N1832 Chronic kidney disease, stage 3b: Secondary | ICD-10-CM | POA: Diagnosis not present

## 2022-06-15 DIAGNOSIS — I10 Essential (primary) hypertension: Secondary | ICD-10-CM | POA: Diagnosis not present

## 2022-06-15 DIAGNOSIS — R809 Proteinuria, unspecified: Secondary | ICD-10-CM | POA: Diagnosis not present

## 2022-06-30 ENCOUNTER — Other Ambulatory Visit: Payer: Self-pay

## 2022-06-30 DIAGNOSIS — Z0000 Encounter for general adult medical examination without abnormal findings: Secondary | ICD-10-CM

## 2022-06-30 DIAGNOSIS — E789 Disorder of lipoprotein metabolism, unspecified: Secondary | ICD-10-CM

## 2022-06-30 DIAGNOSIS — E663 Overweight: Secondary | ICD-10-CM

## 2022-06-30 DIAGNOSIS — I129 Hypertensive chronic kidney disease with stage 1 through stage 4 chronic kidney disease, or unspecified chronic kidney disease: Secondary | ICD-10-CM

## 2022-06-30 DIAGNOSIS — N1832 Chronic kidney disease, stage 3b: Secondary | ICD-10-CM

## 2022-06-30 DIAGNOSIS — R7303 Prediabetes: Secondary | ICD-10-CM

## 2022-06-30 DIAGNOSIS — N401 Enlarged prostate with lower urinary tract symptoms: Secondary | ICD-10-CM

## 2022-07-01 ENCOUNTER — Other Ambulatory Visit: Payer: BC Managed Care – PPO

## 2022-07-01 DIAGNOSIS — I129 Hypertensive chronic kidney disease with stage 1 through stage 4 chronic kidney disease, or unspecified chronic kidney disease: Secondary | ICD-10-CM | POA: Diagnosis not present

## 2022-07-01 DIAGNOSIS — R7303 Prediabetes: Secondary | ICD-10-CM | POA: Diagnosis not present

## 2022-07-01 DIAGNOSIS — N401 Enlarged prostate with lower urinary tract symptoms: Secondary | ICD-10-CM | POA: Diagnosis not present

## 2022-07-01 DIAGNOSIS — E789 Disorder of lipoprotein metabolism, unspecified: Secondary | ICD-10-CM | POA: Diagnosis not present

## 2022-07-02 LAB — HEMOGLOBIN A1C
Hgb A1c MFr Bld: 6.4 % of total Hgb — ABNORMAL HIGH (ref <5.7)
Mean Plasma Glucose: 137 mg/dL
eAG (mmol/L): 7.6 mmol/L

## 2022-07-02 LAB — CBC WITH DIFFERENTIAL/PLATELET
Absolute Monocytes: 351 cells/uL (ref 200–950)
Basophils Absolute: 39 cells/uL (ref 0–200)
Basophils Relative: 0.6 %
Eosinophils Absolute: 371 cells/uL (ref 15–500)
Eosinophils Relative: 5.7 %
HCT: 47.9 % (ref 38.5–50.0)
Hemoglobin: 15.7 g/dL (ref 13.2–17.1)
Lymphs Abs: 1241.5 cells/uL (ref 850–3900)
MCH: 29.2 pg (ref 27.0–33.0)
MCHC: 32.8 g/dL (ref 32.0–36.0)
MCV: 89 fL (ref 80.0–100.0)
MPV: 9.6 fL (ref 7.5–12.5)
Monocytes Relative: 5.4 %
Neutro Abs: 4498 cells/uL (ref 1500–7800)
Neutrophils Relative %: 69.2 %
Platelets: 202 10*3/uL (ref 140–400)
RBC: 5.38 10*6/uL (ref 4.20–5.80)
RDW: 12.6 % (ref 11.0–15.0)
Total Lymphocyte: 19.1 %
WBC: 6.5 10*3/uL (ref 3.8–10.8)

## 2022-07-02 LAB — COMPLETE METABOLIC PANEL WITH GFR
AG Ratio: 1.5 (calc) (ref 1.0–2.5)
ALT: 19 U/L (ref 9–46)
AST: 16 U/L (ref 10–35)
Albumin: 4.1 g/dL (ref 3.6–5.1)
Alkaline phosphatase (APISO): 100 U/L (ref 35–144)
BUN/Creatinine Ratio: 14 (calc) (ref 6–22)
BUN: 23 mg/dL (ref 7–25)
CO2: 28 mmol/L (ref 20–32)
Calcium: 9.5 mg/dL (ref 8.6–10.3)
Chloride: 106 mmol/L (ref 98–110)
Creat: 1.6 mg/dL — ABNORMAL HIGH (ref 0.70–1.35)
Globulin: 2.7 g/dL (calc) (ref 1.9–3.7)
Glucose, Bld: 116 mg/dL — ABNORMAL HIGH (ref 65–99)
Potassium: 4.8 mmol/L (ref 3.5–5.3)
Sodium: 143 mmol/L (ref 135–146)
Total Bilirubin: 0.5 mg/dL (ref 0.2–1.2)
Total Protein: 6.8 g/dL (ref 6.1–8.1)
eGFR: 47 mL/min/{1.73_m2} — ABNORMAL LOW (ref >=60)

## 2022-07-02 LAB — LIPID PANEL
Cholesterol: 153 mg/dL (ref <200)
HDL: 38 mg/dL — ABNORMAL LOW (ref >=40)
LDL Cholesterol (Calc): 93 mg/dL (calc)
Non-HDL Cholesterol (Calc): 115 mg/dL (calc) (ref <130)
Total CHOL/HDL Ratio: 4 (calc) (ref <5.0)
Triglycerides: 120 mg/dL (ref <150)

## 2022-07-02 LAB — TSH: TSH: 0.5 mIU/L (ref 0.40–4.50)

## 2022-07-02 LAB — PSA: PSA: 2.36 ng/mL (ref <=4.00)

## 2022-07-07 ENCOUNTER — Ambulatory Visit (INDEPENDENT_AMBULATORY_CARE_PROVIDER_SITE_OTHER): Payer: BC Managed Care – PPO | Admitting: Family Medicine

## 2022-07-07 ENCOUNTER — Encounter: Payer: Self-pay | Admitting: Family Medicine

## 2022-07-07 VITALS — BP 110/70 | HR 58 | Ht 68.0 in | Wt 183.0 lb

## 2022-07-07 DIAGNOSIS — Z87442 Personal history of urinary calculi: Secondary | ICD-10-CM

## 2022-07-07 DIAGNOSIS — R7303 Prediabetes: Secondary | ICD-10-CM | POA: Diagnosis not present

## 2022-07-07 DIAGNOSIS — L97912 Non-pressure chronic ulcer of unspecified part of right lower leg with fat layer exposed: Secondary | ICD-10-CM

## 2022-07-07 DIAGNOSIS — I129 Hypertensive chronic kidney disease with stage 1 through stage 4 chronic kidney disease, or unspecified chronic kidney disease: Secondary | ICD-10-CM

## 2022-07-07 DIAGNOSIS — N401 Enlarged prostate with lower urinary tract symptoms: Secondary | ICD-10-CM | POA: Diagnosis not present

## 2022-07-07 DIAGNOSIS — R351 Nocturia: Secondary | ICD-10-CM

## 2022-07-07 DIAGNOSIS — Z0000 Encounter for general adult medical examination without abnormal findings: Secondary | ICD-10-CM

## 2022-07-07 DIAGNOSIS — N1832 Chronic kidney disease, stage 3b: Secondary | ICD-10-CM

## 2022-07-07 DIAGNOSIS — N183 Chronic kidney disease, stage 3 unspecified: Secondary | ICD-10-CM

## 2022-07-07 MED ORDER — AMLODIPINE BESYLATE 5 MG PO TABS: 5.0000 mg | ORAL_TABLET | Freq: Every day | ORAL | 3 refills | Status: DC

## 2022-07-07 MED ORDER — TAMSULOSIN HCL 0.4 MG PO CAPS: 0.4000 mg | ORAL_CAPSULE | Freq: Every evening | ORAL | 3 refills | Status: DC

## 2022-07-07 MED ORDER — MUPIROCIN 2 % EX OINT: 1.0000 | TOPICAL_OINTMENT | Freq: Two times a day (BID) | CUTANEOUS | 0 refills | Status: DC

## 2022-07-07 NOTE — Patient Instructions (Addendum)
Thank you for coming to the office today.  Recent Labs    07/01/22 0801  HGBA1C 6.4*   Goal to limit excess carb starches - less biscuit, pizza, pasta etc.  No medication needed for the blood sugar. Try to fix it with the diet.  Improved kidney function! EGFR up to 47. Similar to the 44 number from Dr Cherylann Ratel  Refill medications for future.  Advise NOT to take - "Anti Inflammatories" - Ibuprofen, Advil, Aleve, Naproxen  Use Mupirocin topical ointment that has anti biotic properties and can help wound heal. Use twice a day for 2 weeks, then pause. Can repeat again after 1 week in between can repeat if need.  Try to improve the swelling, with elevation if possible in evening.  If worsening with redness drainage of pus or pain spreading redness fever chills - let us know ASAP or get seen at hospital ED  If not improving we can refer  [facility] at [facility] [address] [place],  [place]  [address]  Main: [phone]   Please schedule a Follow-up Appointment to: Return in about 6 months (around 01/06/2023) for 6 month PreDM A1c.  If you have any other questions or concerns, please feel free to call the office or send a message through MyChart. You may also schedule an earlier appointment if necessary.  Additionally, you may be receiving a survey about your experience at our office within a few days to 1 week by e-mail or mail. We value your feedback.  Saralyn Pilar, DO [facility], [place]

## 2022-07-07 NOTE — Progress Notes (Unsigned)
Subjective:    Patient ID: [name], male    DOB: January 14, 1956, 67 y.o.   MRN: [id number]  [name] is a 67 y.o. male presenting on 07/07/2022 for Annual Exam   HPI   06/15/22 - EGFR from Dr Cherylann Ratel - 44 07/01/22 - Now last lab showed Cr 1.6, and EGFR 47  A1c 6.4, prior range 5.8 to 6.1   Here for Annual Physical and Lab Review.   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 Right has nearly no function.   Question if kidney stone recently, he increased water, could have had kidney stone and passed.   Last lab Creatinine 1.65 (05/2021), prior range 1.4 to 1.6 over past 5+ years.   He has history of CKD in past. No chronic use NSAID. He has had recurrent kidney stones in past, some significant ones   His ACEi was swapped to Losartan. BP improved Current Meds - Losartan 50mg  daily, Amlodipine 5mg  daily Reports good compliance, took meds today. Tolerating well, w/o complaints. Denies CP, dyspnea, HA, edema, dizziness / lightheadedness    Pre-Diabetes Prior A1c 5.9 to 6.1 Last lab A1c 6.0 CBGs: not checking Meds: not on med Currently on ACEi Lifestyle: Weight up 10 lbs approx in 4 months Diet improved. Improved water intake. - Exercise (limited - less exercise but frequent walking) Denies hypoglycemia, polyuria, visual changes, numbness or tingling.   GERD Chronic GERD heartburn, controlled on PPI Request generic order   History of Migraine HA No more migraines, with BP controlled   BPH nocturia Reports frequent nocturia symptoms, without worsening, no other significant urinary symptoms. Taking Tamsulosin 0.4mg  nightly Improved significantly with less nocturia   PMH Nephrolithiasis      Health Maintenance:   Due for Flu Shot, declines today despite counseling on benefits   Declines COVID19 vaccine for future   PSA 1.9 (05/2020) negative.     07/07/2022    3:39 PM 07/01/2021    3:53 PM 03/14/2021    3:24 PM  Depression screen PHQ 2/9  Decreased Interest 0 0 0  Down, Depressed, Hopeless 0 0 0  PHQ - 2 Score 0 0 0  Altered sleeping 0 0 0  Tired, decreased energy 0 0 0  Change in appetite 0 0 0  Feeling bad or failure about yourself  0 0 0  Trouble concentrating 0 0 0  Moving slowly or fidgety/restless 0 0 0  Suicidal thoughts 0 0 0  PHQ-9 Score 0 0 0  Difficult doing work/chores Not difficult at all Not difficult at all Not difficult at all    Past Medical History:  Diagnosis Date   Allergy    Asthma    Chronic kidney disease    GERD (gastroesophageal reflux disease)    Headache    Hypertension    TGA (transient global amnesia)    Past Surgical History:  Procedure Laterality Date   COLONOSCOPY WITH PROPOFOL N/A 12/04/2015   Procedure: COLONOSCOPY WITH PROPOFOL;  Surgeon: Scot Jun, MD;  Location: [facility] ENDOSCOPY;  Service: Endoscopy;  Laterality: N/A;   LITHOTRIPSY     URETEROSCOPY     Social History   Socioeconomic History   Marital status: Married    Spouse name: Not on file  Number of children: Not on file   Years of education: College   Highest education level: Not on file  Occupational History   Not on file  Tobacco Use   Smoking status: Never   Smokeless tobacco: Never  Vaping Use   Vaping Use: Never used  Substance and Sexual Activity   Alcohol use: Yes    Alcohol/week: 2.0 standard drinks of alcohol    Types: 2 Glasses of wine per week    Comment: once a month   Drug use: No   Sexual activity: Not on file  Other Topics Concern   Not on file  Social History Narrative   Not on file   Social Determinants of Health   Financial Resource Strain: Not on file  Food Insecurity: Not on file  Transportation Needs: Not on file   Physical Activity: Not on file  Stress: Not on file  Social Connections: Not on file  Intimate Partner Violence: Not on file   Family History  Problem Relation Age of Onset   COPD Mother    CAD Mother    Alzheimer's disease Father    Diabetes Father    Current Outpatient Medications on File Prior to Visit  Medication Sig   albuterol (VENTOLIN HFA) 108 (90 Base) MCG/ACT inhaler Inhale into the lungs every 6 (six) hours as needed for wheezing or shortness of breath.   aspirin EC 81 MG EC tablet Take 1 tablet (81 mg total) by mouth daily.   clobetasol ointment (TEMOVATE) 0.05 % Apply 1 Application topically 2 (two) times daily. For 1-2 weeks then may repeat course if need.   losartan (COZAAR) 50 MG tablet Take 50 mg by mouth daily.   omeprazole (PRILOSEC) 40 MG capsule Take by mouth.   No current facility-administered medications on file prior to visit.    Review of Systems  Constitutional:  Negative for activity change, appetite change, chills, diaphoresis, fatigue and fever.  HENT:  Positive for hearing loss. Negative for congestion.   Eyes:  Negative for visual disturbance.  Respiratory:  Negative for cough, chest tightness, shortness of breath and wheezing.   Cardiovascular:  Negative for chest pain, palpitations and leg swelling.  Gastrointestinal:  Negative for abdominal pain, constipation, diarrhea, nausea and vomiting.  Genitourinary:  Negative for dysuria, frequency and hematuria.  Musculoskeletal:  Negative for arthralgias and neck pain.  Skin:  Negative for rash.  Neurological:  Negative for dizziness, weakness, light-headedness, numbness and headaches.  Hematological:  Negative for adenopathy.  Psychiatric/Behavioral:  Negative for behavioral problems, dysphoric mood and sleep disturbance.    Per HPI unless specifically indicated above      Objective:    BP 110/70 (BP Location: Left Arm, Patient Position: Sitting)   Pulse (!) 58   Ht 5\' 8"  (1.727 m)   Wt 183  lb (83 kg)   SpO2 98%   BMI 27.83 kg/m   Wt Readings from Last 3 Encounters:  07/07/22 183 lb (83 kg)  07/01/21 193 lb 9.6 oz (87.8 kg)  03/27/21 182 lb (82.6 kg)    Physical Exam Vitals and nursing note reviewed.  Constitutional:      General: He is not in acute distress.    Appearance: He is well-developed. He is not diaphoretic.     Comments: Well-appearing, comfortable, cooperative  HENT:     Head: Normocephalic and atraumatic.     Ears:     Comments: Hard of hearing, has hearing aid Eyes:     General:  Right eye: No discharge.        Left eye: No discharge.     Conjunctiva/sclera: Conjunctivae normal.     Pupils: Pupils are equal, round, and reactive to light.  Neck:     Thyroid: No thyromegaly.  Cardiovascular:     Rate and Rhythm: Normal rate and regular rhythm.     Pulses: Normal pulses.     Heart sounds: Normal heart sounds. No murmur heard. Pulmonary:     Effort: Pulmonary effort is normal. No respiratory distress.     Breath sounds: Normal breath sounds. No wheezing or rales.  Abdominal:     General: Bowel sounds are normal. There is no distension.     Palpations: Abdomen is soft. There is no mass.     Tenderness: There is no abdominal tenderness.  Musculoskeletal:        General: No tenderness. Normal range of motion.     Cervical back: Normal range of motion and neck supple.     Comments: Upper / Lower Extremities: - Normal muscle tone, strength bilateral upper extremities 5/5, lower extremities 5/5  Lymphadenopathy:     Cervical: No cervical adenopathy.  Skin:    General: Skin is warm and dry.     Findings: Lesion (see photo) present. No erythema or rash.  Neurological:     Mental Status: He is alert and oriented to person, place, and time.     Comments: Distal sensation intact to light touch all extremities  Psychiatric:        Mood and Affect: Mood normal.        Behavior: Behavior normal.        Thought Content: Thought content normal.      Comments: Well groomed, good eye contact, normal speech and thoughts     Right lower Leg    Results for orders placed or performed in visit on 06/30/22  TSH  Result Value Ref Range   TSH 0.50 0.40 - 4.50 mIU/L  PSA  Result Value Ref Range   PSA 2.36 < OR = 4.00 ng/mL  Hemoglobin A1c  Result Value Ref Range   Hgb A1c MFr Bld 6.4 (H) <5.7 % of total Hgb   Mean Plasma Glucose 137 mg/dL   eAG (mmol/L) 7.6 mmol/L  Lipid panel  Result Value Ref Range   Cholesterol 153 <200 mg/dL   HDL 38 (L) > OR = 40 mg/dL   Triglycerides 696 <295 mg/dL   LDL Cholesterol (Calc) 93 mg/dL (calc)   Total CHOL/HDL Ratio 4.0 <5.0 (calc)   Non-HDL Cholesterol (Calc) 115 <130 mg/dL (calc)  CBC with Differential/Platelet  Result Value Ref Range   WBC 6.5 3.8 - 10.8 Thousand/uL   RBC 5.38 4.20 - 5.80 Million/uL   Hemoglobin 15.7 13.2 - 17.1 g/dL   HCT 28.4 13.2 - 44.0 %   MCV 89.0 80.0 - 100.0 fL   MCH 29.2 27.0 - 33.0 pg   MCHC 32.8 32.0 - 36.0 g/dL   RDW 10.2 72.5 - 36.6 %   Platelets 202 140 - 400 Thousand/uL   MPV 9.6 7.5 - 12.5 fL   Neutro Abs 4,498 1,500 - 7,800 cells/uL   Lymphs Abs 1,241.5 850 - 3,900 cells/uL   Absolute Monocytes 351 200 - 950 cells/uL   Eosinophils Absolute 371 15 - 500 cells/uL   Basophils Absolute 39 0 - 200 cells/uL   Neutrophils Relative % 69.2 %   Total Lymphocyte 19.1 %   Monocytes Relative 5.4 %  Eosinophils Relative 5.7 %   Basophils Relative 0.6 %  COMPLETE METABOLIC PANEL WITH GFR  Result Value Ref Range   Glucose, Bld 116 (H) 65 - 99 mg/dL   BUN 23 7 - 25 mg/dL   Creat 1.61 (H) 0.96 - 1.35 mg/dL   eGFR 47 (L) > OR = 60 mL/min/1.23m2   BUN/Creatinine Ratio 14 6 - 22 (calc)   Sodium 143 135 - 146 mmol/L   Potassium 4.8 3.5 - 5.3 mmol/L   Chloride 106 98 - 110 mmol/L   CO2 28 20 - 32 mmol/L   Calcium 9.5 8.6 - 10.3 mg/dL   Total Protein 6.8 6.1 - 8.1 g/dL   Albumin 4.1 3.6 - 5.1 g/dL   Globulin 2.7 1.9 - 3.7 g/dL (calc)   AG Ratio 1.5 1.0 - 2.5  (calc)   Total Bilirubin 0.5 0.2 - 1.2 mg/dL   Alkaline phosphatase (APISO) 100 35 - 144 U/L   AST 16 10 - 35 U/L   ALT 19 9 - 46 U/L      Assessment & Plan:   Problem List Items Addressed This Visit     Benign hypertension with CKD (chronic kidney disease) stage III    Well-controlled HTN - Home BP readings reviewed Complication with CKD III, R Renal agenesis history hydronephrosis Off Metoprolol    Plan: 1. Continue current Amlodipine 5mg  daily, Lisinopril 10mg  daily 2. Encourage improved lifestyle - low sodium diet, regular exercise 3. Keep monitor BP outside office, bring readings to next visit, if persistently >140/90 or new symptoms notify office sooner      Relevant Medications   amLODipine (NORVASC) 5 MG tablet   Benign prostatic hyperplasia with nocturia   CKD (chronic kidney disease), stage III    See A&P HTN Likely secondary to age, HTN, Sugar, history of nephrolithiasis Stable, last lab Creatinine 1.6 eGFR 47 maintained Cr at baseline 1.4-1.6      History of nephrolithiasis   Relevant Medications   tamsulosin (FLOMAX) 0.4 MG CAPS capsule   Pre-diabetes    A1c 6.4 Elevated Goal to limit excess carb starches - less biscuit, pizza, pasta etc. No medication needed for the blood sugar. Try to fix it with the diet.      Other Visit Diagnoses     Annual physical exam    -  Primary   Non-healing ulcer of lower leg, right, with fat layer exposed       Relevant Medications   mupirocin ointment (BACTROBAN) 2 %       Updated Health Maintenance information Reviewed recent lab results with patient Encouraged improvement to lifestyle with diet and exercise  Recent Labs    07/01/22 0801  HGBA1C 6.4*   CKD Improved kidney function! EGFR up to 47. Similar to the 44 number from Dr Cherylann Ratel  Refill medications for future.  Advise NOT to take - "Anti Inflammatories" - Ibuprofen, Advil, Aleve, Naproxen  RLE Non healing ulceration Use Mupirocin topical  ointment that has anti biotic properties and can help wound heal. Use twice a day for 2 weeks, then pause. Can repeat again after 1 week in between can repeat if need. RICE Try to improve the swelling, with elevation if possible in evening. Return precautions reviewed   Meds ordered this encounter  Medications   amLODipine (NORVASC) 5 MG tablet    Sig: Take 1 tablet (5 mg total) by mouth daily.    Dispense:  90 tablet    Refill:  3  tamsulosin (FLOMAX) 0.4 MG CAPS capsule    Sig: Take 1 capsule (0.4 mg total) by mouth every evening.    Dispense:  90 capsule    Refill:  3   mupirocin ointment (BACTROBAN) 2 %    Sig: Apply 1 Application topically 2 (two) times daily. For 2 weeks then pause and can repeat, use for skin sore/ulceration    Dispense:  30 g    Refill:  0      Follow up plan: Return in about 6 months (around 01/06/2023) for 6 month PreDM A1c.  Saralyn Pilar, DO [facility] 07/07/2022, 3:58 PM

## 2022-07-08 ENCOUNTER — Encounter: Payer: BC Managed Care – PPO | Admitting: Family Medicine

## 2022-07-08 NOTE — Assessment & Plan Note (Signed)
A1c 6.4 Elevated Goal to limit excess carb starches - less biscuit, pizza, pasta etc. No medication needed for the blood sugar. Try to fix it with the diet.

## 2022-07-08 NOTE — Assessment & Plan Note (Signed)
See A&P HTN Likely secondary to age, HTN, Sugar, history of nephrolithiasis Stable, last lab Creatinine 1.6 eGFR 47 maintained Cr at baseline 1.4-1.6

## 2022-07-08 NOTE — Assessment & Plan Note (Signed)
Well-controlled HTN ?- Home BP readings reviewed ?Complication with CKD III, R Renal agenesis history hydronephrosis ?Off Metoprolol ?  ? ?Plan: ?1. Continue current Amlodipine 5mg daily, Lisinopril 10mg daily ?2. Encourage improved lifestyle - low sodium diet, regular exercise ?3. Keep monitor BP outside office, bring readings to next visit, if persistently >140/90 or new symptoms notify office sooner ?

## 2022-09-11 IMAGING — NM NM RENAL IMAGING FLOW W/ PHARM
3 series · 18 of 18 positions shown · non-contrast
Comparison: 02/26/2021 CT abdomen and pelvis

CLINICAL DATA: RIGHT hydronephrosis, marked hydronephrosis and
cortical thinning by CT

EXAM:
NUCLEAR MEDICINE RENAL SCAN WITH DIURETIC ADMINISTRATION
TECHNIQUE: Radionuclide angiographic and sequential renal images were obtained
after intravenous injection of radiopharmaceutical. Imaging was
continued during slow intravenous injection of Lasix approximately
15 minutes after the start of the examination.
RADIOPHARMACEUTICALS:  5.29 mCi 3echnetium-RRm MAG3 IV
Pharmaceutical: Lasix 41 0.3 mg IV

[Series 1000: lasix renal mag 3 · 7.79mm/px · 6 of 114 frames shown]
[frame 10/114]
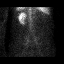
[frame 29/114]
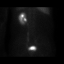
[frame 48/114]
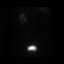
[frame 67/114]
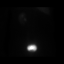
[frame 86/114]
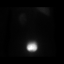
[frame 105/114]
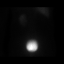

[Series 1000: lasix renal mag 3 (results) · 7.79mm/px · 6 of 114 frames shown]
[frame 10/114]
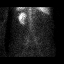
[frame 29/114]
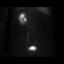
[frame 48/114]
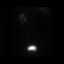
[frame 67/114]
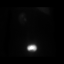
[frame 86/114]
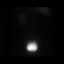
[frame 105/114]
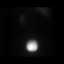

[Series 1000: lasix renal mag 3 (first dynamic renal results) · 7.79mm/px · 6 of 114 frames shown]
[frame 10/114]
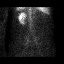
[frame 29/114]
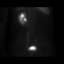
[frame 48/114]
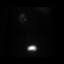
[frame 67/114]
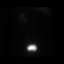
[frame 86/114]
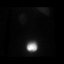
[frame 105/114]
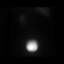

[18 of 18 positions shown; findings below may reference images not displayed]

Correlation is made with the report from a prior radionuclide
renogram from 01/03/2003, images of which are no longer available
FINDINGS: Flow: Normal blood flow to LEFT kidney. Negligible blood flow RIGHT
kidney.

Left renogram: Normal uptake, concentration and excretion of tracer
by LEFT kidney. Good washout of tracer before and continuing
following Lasix administration. No residual tracer at conclusion of
exam. Analysis of the renogram curve demonstrates a normal time to
peak activity of 3.2 minutes with fall to half maximum activity at
10 minutes.

Right renogram: No appreciable uptake, concentration or excretion of
tracer by RIGHT kidney.

Differential:

Left kidney = 98 %

Right kidney = 2 % (background)

T1/2 post Lasix :

Left kidney = 17.2 min

Right kidney = N/A min
IMPRESSION: No appreciable RIGHT renal function.

Normal LEFT renogram.

All of the patient's demonstrated renal function is by the LEFT
kidney.

## 2022-10-08 ENCOUNTER — Other Ambulatory Visit: Payer: Self-pay | Admitting: Family Medicine

## 2022-10-08 DIAGNOSIS — L97912 Non-pressure chronic ulcer of unspecified part of right lower leg with fat layer exposed: Secondary | ICD-10-CM

## 2022-10-09 MED ORDER — MUPIROCIN 2 % EX OINT: 1.0000 | TOPICAL_OINTMENT | Freq: Two times a day (BID) | CUTANEOUS | 0 refills | Status: DC

## 2022-10-15 DIAGNOSIS — N2581 Secondary hyperparathyroidism of renal origin: Secondary | ICD-10-CM | POA: Diagnosis not present

## 2022-10-15 DIAGNOSIS — I10 Essential (primary) hypertension: Secondary | ICD-10-CM | POA: Diagnosis not present

## 2022-10-15 DIAGNOSIS — N1832 Chronic kidney disease, stage 3b: Secondary | ICD-10-CM | POA: Diagnosis not present

## 2022-11-04 ENCOUNTER — Ambulatory Visit
Admission: EM | Admit: 2022-11-04 | Discharge: 2022-11-04 | Disposition: A | Discharge status: Home / Self Care | Payer: BC Managed Care – PPO | Attending: Emergency Medicine | Admitting: Emergency Medicine

## 2022-11-04 DIAGNOSIS — L97912 Non-pressure chronic ulcer of unspecified part of right lower leg with fat layer exposed: Secondary | ICD-10-CM | POA: Diagnosis not present

## 2022-11-04 MED ORDER — CEPHALEXIN 500 MG PO CAPS: 500.0000 mg | ORAL_CAPSULE | Freq: Three times a day (TID) | ORAL | 0 refills | Status: AC

## 2022-11-04 NOTE — ED Triage Notes (Signed)
Patient to Urgent Care with complaints of a wounds present to his right leg. Concerned due to delayed healing. Multiple areas of ulceration. Reports working long hours on his feet which causes his legs to swell.   Has been applying mupirocin ointment. Denies any pain/ itching.

## 2022-11-04 NOTE — Discharge Instructions (Addendum)
Today your evaluated for your ulcerations, as they have been present for 1 month you have been referred to the wound center for further evaluation and management  Delayed healing is most likely related to the circulation in your legs  If you have not heard from the wound center in 1 week, information is listed on the front page and you may reach out to their clinic to follow-up and schedule your appointment  You have been placed on a oral antibiotic to ensure that there are no germs contributing to your symptoms  Take cephalexin every 8 hours for 5 days  Continue to cleanse daily with soap and water and apply topical antibiotic ointment as directed until you have been evaluated in person  Until seen by the wound center and you may follow-up with your primary doctor or urgent care for any further concerns

## 2022-11-04 NOTE — ED Provider Notes (Signed)
[name]    CSN: [id number] Arrival date & time: 11/04/22  1800      History   Chief Complaint Chief Complaint  Patient presents with   Wound Check    HPI [name] is a 67 y.o. male.   Patient presents for evaluation of ulcerations present to the right lower extremity for 1 month.  Has been cleansing and applying mupirocin ointment to the skin but lesions have persisted.  Does not believe that they have gotten larger or deeper but has not seen improvement.  Denies presence of drainage or fevers.  Endorses poor circulation to the right lower extremity beginning after COVID.  Intermittently has had a redded rash to the skin which is able to be cleared with use of clobetasol ointment.  Followed by primary care.  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 Authorizing Provider  cephALEXin (KEFLEX) 500 MG capsule Take 1 capsule (500 mg total) by mouth 3 (three) times daily for 5 days.  11/04/22 11/09/22 Yes ,  R, NP  albuterol (VENTOLIN HFA) 108 (90 Base) MCG/ACT inhaler Inhale into the lungs every 6 (six) hours as needed for wheezing or shortness of breath.    [provider]  amLODipine (NORVASC) 5 MG tablet Take 1 tablet (5 mg total) by mouth daily. 07/07/22   Karamalegos, Netta Neat, DO  aspirin EC 81 MG EC tablet Take 1 tablet (81 mg total) by mouth daily. 08/30/15   Adrian Saran, MD  clobetasol ointment (TEMOVATE) 0.05 % Apply 1 Application topically 2 (two) times daily. For 1-2 weeks then may repeat course if need. 12/12/21   Karamalegos, Netta Neat, DO  losartan (COZAAR) 50 MG tablet Take 50 mg by mouth daily. 01/30/21   [provider]  mupirocin ointment (BACTROBAN) 2 % Apply 1 Application topically 2 (two) times daily. For 2 weeks then pause and can repeat, use for skin sore/ulceration 10/09/22   Lorre Munroe, NP  omeprazole (PRILOSEC) 40 MG capsule Take by mouth.    [provider]  tamsulosin (FLOMAX) 0.4 MG CAPS capsule Take 1 capsule (0.4 mg total) by mouth every evening. 07/07/22   Karamalegos, Netta Neat, DO    Family History Family History  Problem Relation Age of Onset   COPD Mother    CAD Mother    Alzheimer's disease Father    Diabetes Father     Social History Social History  Tobacco Use   Smoking status: Never   Smokeless tobacco: Never  Vaping Use   Vaping status: Never Used  Substance Use Topics   Alcohol use: Yes    Alcohol/week: 2.0 standard drinks of alcohol    Types: 2 Glasses of wine per week    Comment: once a month   Drug use: No     Allergies   Seasonal ic [cholestatin]   Review of Systems Review of Systems Defer to HPI   Physical Exam Triage Vital Signs ED Triage Vitals  Encounter Vitals Group     BP 11/04/22 1901 134/80     Systolic BP Percentile --      Diastolic BP Percentile --      Pulse Rate 11/04/22 1901 64     Resp 11/04/22 1901 18     Temp 11/04/22 1901 98.1 F (36.7  C)     Temp Source 11/04/22 1901 Oral     SpO2 11/04/22 1901 95 %     Weight --      Height --      Head Circumference --      Peak Flow --      Pain Score 11/04/22 1900 2     Pain Loc --      Pain Education --      Exclude from Growth Chart --    No data found.  Updated Vital Signs BP 134/80 (BP Location: Left Arm)   Pulse 64   Temp 98.1 F (36.7 C) (Oral)   Resp 18   SpO2 95%   Visual Acuity Right Eye Distance:   Left Eye Distance:   Bilateral Distance:    Right Eye Near:   Left Eye Near:    Bilateral Near:     Physical Exam Constitutional:      Appearance: Normal appearance.  Eyes:     Extraocular Movements: Extraocular movements intact.  Pulmonary:     Effort: Pulmonary effort is normal.  Skin:    Comments: Refer to photo below   3 ulcerations present to the medial aspect of the right lower extremity  1 x 2 cm venous stasis ulceration with a greenish to yellow eschar to the wound bed  1 x 2 cm venous stasis ulceration with a greenish-yellow eschar to the wound bed  2 x 5 venous stasis ulceration with a blackened eschar to the wound bed, nontender, nondraining  Neurological:     Mental Status: He is alert and oriented to person, place, and time. Mental status is at baseline.      UC Treatments / Results  Labs (all labs ordered are listed, but only abnormal results are displayed) Labs Reviewed - No data to display  EKG   Radiology No results found.  Procedures Procedures (including critical care time)  Medications Ordered in UC Medications - No data to display  Initial Impression / Assessment and Plan / UC Course  I have reviewed the triage vital signs and the nursing notes.  Pertinent labs & imaging results that were available during my care of the patient were reviewed by me and considered in my medical decision making (see chart for details).  Also the right lower extremity with fat layer exposed  Ulcerations present for 1 month with  delayed healing most likely due to poor circulation, discussed this with patient, prophylactically providing antibiotic, cephalexin sent to pharmacy, may continue use of clobetasol as needed if venous dermatitis reoccurs, currently not present, advised to continue daily cleansing with  soap and water and application of mupirocin ointment over the wound bed, wound care consult placed, patient to follow-up with primary care doctor for any further concerns until evaluated by wound specialist Final Clinical Impressions(s) / UC Diagnoses   Final diagnoses:  Ulcer of right lower extremity with fat layer exposed [facility])     Discharge Instructions      Today your evaluated for your ulcerations, as they have been present for 1 month you have been referred to the wound center for further evaluation and management  Delayed healing is most likely related to the circulation in your legs  If you have not heard from the wound center in 1 week, information is listed on the front page and you may reach out to their clinic to follow-up and schedule your appointment  You have been placed on a oral antibiotic to ensure that there are no germs contributing to your symptoms  Take cephalexin every 8 hours for 5 days  Continue to cleanse daily with soap and water and apply topical antibiotic ointment as directed until you have been evaluated in person  Until seen by the wound center and you may follow-up with your primary doctor or urgent care for any further concerns   ED Prescriptions     Medication Sig Dispense Auth. Provider   cephALEXin (KEFLEX) 500 MG capsule Take 1 capsule (500 mg total) by mouth 3 (three) times daily for 5 days. 15 capsule , Elita Boone, NP      PDMP not reviewed this encounter.   Valinda Hoar, NP 11/04/22 1958

## 2022-11-07 ENCOUNTER — Encounter: Payer: Self-pay | Admitting: Family Medicine

## 2022-11-07 DIAGNOSIS — L98499 Non-pressure chronic ulcer of skin of other sites with unspecified severity: Secondary | ICD-10-CM

## 2022-11-10 MED ORDER — SILVER SULFADIAZINE 1 % EX CREA: 1.0000 | TOPICAL_CREAM | Freq: Every day | CUTANEOUS | 0 refills | Status: DC

## 2022-11-18 ENCOUNTER — Encounter: Payer: BC Managed Care – PPO | Attending: Internal Medicine | Admitting: Internal Medicine

## 2022-11-18 DIAGNOSIS — H908 Mixed conductive and sensorineural hearing loss, unspecified: Secondary | ICD-10-CM | POA: Insufficient documentation

## 2022-11-18 DIAGNOSIS — L97818 Non-pressure chronic ulcer of other part of right lower leg with other specified severity: Secondary | ICD-10-CM | POA: Diagnosis not present

## 2022-11-18 DIAGNOSIS — I87311 Chronic venous hypertension (idiopathic) with ulcer of right lower extremity: Secondary | ICD-10-CM | POA: Insufficient documentation

## 2022-11-25 ENCOUNTER — Encounter: Payer: BC Managed Care – PPO | Admitting: Internal Medicine

## 2022-11-25 DIAGNOSIS — H908 Mixed conductive and sensorineural hearing loss, unspecified: Secondary | ICD-10-CM | POA: Diagnosis not present

## 2022-11-25 DIAGNOSIS — I87311 Chronic venous hypertension (idiopathic) with ulcer of right lower extremity: Secondary | ICD-10-CM

## 2022-11-25 DIAGNOSIS — L97818 Non-pressure chronic ulcer of other part of right lower leg with other specified severity: Secondary | ICD-10-CM | POA: Diagnosis not present

## 2022-11-27 NOTE — Progress Notes (Signed)
[name] ([id number]) [id number] Page 1 of 8 Visit Report for 11/18/2022 Biopsy Details Patient Name: Date of Service: [name], [name]. 11/18/2022 8:45 [name] Medical Record Number: [id number] Patient Account Number: [id number] Date of Birth/Sex: Treating RN: 09-Dec-1955 (67 y.o. Judie Petit) Yevonne Pax Primary Care Provider: Saralyn Pilar Other Clinician: Referring Provider: Treating Provider/Extender: Lise Auer in Treatment: 0 Biopsy Performed for: Wound #1 Right, Lateral Lower Leg Location(s): Wound Bed, Wound Margin Performed By: Physician Geralyn Corwin, MD Tissue Punch: Yes Size (mm): 3 Number of Specimens T aken: 1 Specimen Sent T Pathology: o Yes Level of Consciousness (Pre-procedure): Awake and Alert Pre-procedure Verification/Time-Out Taken: No Pain Control: Lidocaine Injectable Lidocaine Percent: 2% Instrument: Forceps, Scissors Bleeding: Moderate Hemostasis Achieved: Surgicel Procedural Pain: 0 Post Procedural Pain: 0 Response to Treatment: Procedure was tolerated well Level of Consciousness (Post-procedure): Awake and Alert Post Procedure Diagnosis Same as Pre-procedure Electronic Signature(s) Signed: 11/19/2022 2:05:34 PM By: Yevonne Pax RN Signed: 11/27/2022 12:11:16 PM By: Geralyn Corwin DO Entered By: Yevonne Pax on 11/19/2022 11:05:33 -------------------------------------------------------------------------------- Chief Complaint Document Details Patient Name: Date of Service: [name], [name]. 11/18/2022 8:45 [name] Medical Record Number: [id number] Patient Account Number: [id number] Date of Birth/Sex: Treating RN: [birth year] (67 y.o. [name] Primary Care Provider: Saralyn Pilar Other Clinician: Referring Provider: Treating Provider/Extender: Lise Auer in Treatment: 0 [name] ([id number]) [id number] Page  2 of 8 Information Obtained from: Patient Chief Complaint 11/18/2022; right lower extremity wounds Electronic Signature(s) Signed: 11/18/2022 12:05:18 PM By: Geralyn Corwin DO Entered By: Geralyn Corwin on 11/18/2022 07:29:51 -------------------------------------------------------------------------------- HPI Details Patient Name: Date of Service: [name], [name]. 11/18/2022 8:45 [name] Medical Record Number: [id number] Patient Account Number: [id number] Date of Birth/Sex: Treating RN: 01-07-1956 (67 y.o. [name] Primary Care Provider: Saralyn Pilar Other Clinician: Referring Provider: Treating Provider/Extender: Lise Auer in Treatment: 0 History of Present Illness HPI Description: 11/18/2022 [name] is a 67 year old male with a past medical history of nerve conduction deafness that presents to the clinic for a 7 month history of nonhealing ulcers to the right lower extremity. He states these happened spontaneously and have progressively gotten worse. The more proximal wound has been present for 7 months and the most recent wound on the top of the foot/distal leg has been present for the past month. He has 5 wounds total. He visited the ED on 11/04/2022 for this issue. He was given cephalexin for which he completed. He has been using Several ointments at different times to address this issue. These ointments include Silvadene, mupirocin and clobetasol.. At times he keeps the wounds open to air. He also states he will go in the pool with the wounds exposed. For work he is on his feet for long periods of time. He does not wear compression stockings. ABIs in office were 0.8. He currently denies signs of infection. Electronic Signature(s) Signed: 11/18/2022 12:05:18 PM By: Geralyn Corwin DO Entered By: Geralyn Corwin on 11/18/2022 07:44:38 -------------------------------------------------------------------------------- Physical Exam  Details Patient Name: Date of Service: [name]. 11/18/2022 8:45 [name] Medical Record Number: [id number] Patient Account Number: [id number] Date of Birth/Sex: Treating RN: 11-11-1955 (67 y.o. Judie Petit) Yevonne Pax Primary Care Provider: Saralyn Pilar Other Clinician: Referring Provider: Treating Provider/Extender: Lise Auer in Treatment: 0 Constitutional . Cardiovascular [name] ([id number]) [id number] Page 3 of 8 2+ dorsalis pedis/posterior tibialis pulses. Psychiatric . Notes  Right lower extremity: T the distal aspect there are 5 open wounds with nonviable tissue throughout. 2+ pitting edema to the knee. Venous stasis dermatitis o with varicosities noted. Easily palpable dorsalis pedal pulses and posterior tibial pulses. Electronic Signature(s) Signed: 11/18/2022 12:05:18 PM By: Geralyn Corwin DO Entered By: Geralyn Corwin on 11/18/2022 07:44:45 -------------------------------------------------------------------------------- Physician Orders Details Patient Name: Date of Service: [name], [name]. 11/18/2022 8:45 [name] Medical Record Number: [id number] Patient Account Number: [id number] Date of Birth/Sex: Treating RN: 07/21/1955 (67 y.o. 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 Return Appointment in 2 weeks. Bathing/ Shower/ Hygiene May shower; gently cleanse wound with  antibacterial soap, rinse and pat dry prior to dressing wounds Anesthetic (Use 'Patient Medications' Section for Anesthetic Order Entry) Injectable lidocaine applied before procedure. Edema Control - Lymphedema / Segmental Compressive Device / Other Elevate, Exercise Daily and A void Standing for Long Periods of Time. Elevate legs to the level of the heart and pump ankles as often as possible Elevate leg(s) parallel to the floor when sitting. Wound Treatment Wound #1 - Lower Leg Wound Laterality: Right, Lateral Cleanser: Byram Ancillary Kit - 15 Day Supply (DME) (Generic) 1 x Per Day/30 Days Discharge Instructions: Use supplies as instructed; Kit contains: (15) Saline Bullets; (15) 3x3 Gauze; 15 pr Gloves Cleanser: Vashe 5.8 (oz) 1 x Per Day/30 Days Discharge Instructions: Use vashe 5.8 (oz) as directed Prim Dressing: Gauze (DME) (Generic) 1 x Per Day/30 Days ary Discharge Instructions: moistened with vashe Solution Secondary Dressing: ABD Pad 5x9 (in/in) (DME) (Generic) 1 x Per Day/30 Days Discharge Instructions: Cover with ABD pad Labarron, Pianka [name] ([id number]) [id number] Page 4 of 8 Laboratory Bacteria identified in Tissue by Biopsy culture (MICRO) LOINC Code: [phone] Convenience Name: Biopsy specimen culture Bacteria identified in Wound by Culture (MICRO) LOINC Code: [id number] Convenience Name: Wound culture routine Electronic Signature(s) Signed: 11/18/2022 12:05:18 PM By: Geralyn Corwin DO Entered By: Geralyn Corwin on 11/18/2022 07:46:23 -------------------------------------------------------------------------------- Problem List Details Patient Name: Date of Service: [name], Ashtyn [name]. 11/18/2022 8:45 [name] Medical Record Number: [id number] Patient Account Number: [id number] Date of Birth/Sex: Treating RN: [birth year] (67 y.o. 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, unspecified 11/18/2022 No Yes Inactive Problems Resolved Problems Electronic Signature(s) Signed: 11/18/2022 12:05:18 PM By: Geralyn Corwin DO Entered By: Geralyn Corwin on 11/18/2022 07:29:31 [name], [name] ([id number]) [id number] Page 5 of 8 -------------------------------------------------------------------------------- Progress Note Details Patient Name: Date of Service: [name], [name] 11/18/2022 8:45 [name] Medical Record Number: [id number] Patient Account Number: [id number] Date of Birth/Sex: Treating RN: 09/05/1955 (67 y.o. Judie Petit) Yevonne Pax Primary Care Provider: Saralyn Pilar Other Clinician: Referring Provider: Treating Provider/Extender: Lise Auer in Treatment: 0 Subjective Chief Complaint  Information obtained from Patient 11/18/2022; right lower extremity wounds History of Present Illness (HPI) 11/18/2022 Mr. Abel Zaruba is a 67 year old male with a past medical history of nerve conduction deafness that presents to the clinic for a 7 month history of nonhealing ulcers to the right lower extremity. He states these happened spontaneously and have progressively gotten worse. The more proximal wound has been present for 7 months and the most recent wound on the top of the foot/distal leg has been present for the past month. He has 5 wounds total. He visited the ED on 11/04/2022 for this issue. He was given cephalexin for which he completed. He has been using Several ointments at different times to address  this issue. These ointments include Silvadene, mupirocin and clobetasol.. At times he keeps the wounds open to air. He also states he will go in the pool with the wounds exposed. For work he is on his feet for long periods of time. He does not wear compression stockings. ABIs in office were 0.8. He currently denies signs of infection. Patient History Allergies No Known Allergies Social History Never smoker, Marital Status - Married, Alcohol Use - Never, Drug Use - No History, Caffeine Use - Daily. Medical History Respiratory Patient has history of Asthma Cardiovascular Patient has history of Hypertension Review of Systems (ROS) Genitourinary CKF Integumentary (Skin) Complains or has symptoms of Wounds. Objective Constitutional Vitals Time Taken: 9:00 AM, Height: 67 in, Source: Stated, Weight: 174 lbs, BMI: 27.2, Temperature: 97.4 F, Pulse: 73 bpm, Respiratory Rate: 18 breaths/min, Blood Pressure: 176/92 mmHg. Cardiovascular 2+ dorsalis pedis/posterior tibialis pulses. General Notes: Right lower extremity: T the distal aspect there are 5 open wounds with nonviable tissue throughout. 2+ pitting edema to the knee. Venous o stasis dermatitis with varicosities noted. Easily palpable dorsalis pedal pulses and posterior tibial pulses. Integumentary (Hair, Skin) Wound #1 status is Open. Original cause of wound was Gradually Appeared. The date acquired was: 08/29/2022. The wound is located on the Right,Lateral Lower Leg. The wound measures 11cm length x 11cm width x 0.4cm depth; 95.033cm^2 area and 38.013cm^3 volume. There is Fat Layer (Subcutaneous Tissue) exposed. There is no tunneling or undermining noted. There is a medium amount of serosanguineous drainage noted. There is small (1-33%) pink granulation within the wound bed. There is a large (67-100%) amount of necrotic tissue within the wound bed including Eschar and Adherent Slough. 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 Etiology is not completely clear at this time. I recommended a biopsy and this was done in office. We also sent a specimen for tissue culture. On the differential is autoimmune versus venous stasis ulcers. He has easily palpable pedal pulses and ABIs in office were 0.8 so I do not suspect arterial disease as being the cause of these wounds. For now I recommended Vashe wet-to-dry dressings. I recommended he keep these covered at all times except for when he showers. I highly recommended he not go into the pool with these wounds. Can use an Ace wrap for compression at this time. I will see him back in 1 week. Addendum 8/26; patient's wound culture showed Pseudomonas aeruginosa sensitive to ciprofloxacin and this was sent into the pharmacy. Also  recommended Keystone antibiotic ointment. He had a skin biopsy done as well that showed fibrosis inflammation. This was nonspecific. Procedures Wound #1 Pre-procedure diagnosis of Wound #1 is an Atypical located on the Right, Lateral Lower Leg . There was a biopsy performed by Geralyn Corwin, MD. There was a biopsy performed on Wound Bed, Wound Margin. The skin was cleansed and prepped with anti-septic followed by pain control using Lidocaine Injectable: 2%. Utilizing a 3 mm tissue punch, tissue was removed at its base with the following instrument(s): Forceps and Scissors and sent to pathology. A Moderate amount of bleeding was controlled with Surgicel. The procedure was tolerated well with a pain level of 0 throughout and a pain level of 0 following the  procedure. Post procedure Diagnosis Wound #1: Same as Pre-Procedure Plan Follow-up Appointments: Return Appointment in 1 week. Return Appointment in 2 weeks. Bathing/ Shower/ Hygiene: May shower; gently cleanse wound with antibacterial soap, rinse and pat dry prior to dressing wounds Anesthetic (Use 'Patient Medications' Section for Anesthetic Order Entry): Injectable lidocaine applied before procedure. Edema Control - Lymphedema / Segmental Compressive Device / Other: Elevate, Exercise Daily and Avoid Standing for Long Periods of Time. Elevate legs to the level of the heart and pump ankles as often as possible Elevate leg(s) parallel to the floor when sitting. Laboratory ordered were: Biopsy specimen culture, Wound culture routine WOUND #1: - Lower Leg Wound Laterality: Right, Lateral Cleanser: Byram Ancillary Kit - 15 Day Supply (DME) (Generic) 1 x Per Day/30 Days Discharge Instructions: Use supplies as instructed; Kit contains: (15) Saline Bullets; (15) 3x3 Gauze; 15 pr Gloves Cleanser: Vashe 5.8 (oz) 1 x Per Day/30 Days Discharge Instructions: Use vashe 5.8 (oz) as directed Prim Dressing: Gauze (DME) (Generic) 1 x Per Day/30 Days ary Discharge Instructions: moistened with vashe Solution Secondary Dressing: ABD Pad 5x9 (in/in) (DME) (Generic) 1 x Per Day/30 Days Discharge Instructions: Cover with ABD pad 1. Vashe wet-to-dry dressings 2. Punch biopsy for Dermpath and tissue culture 3. Ace wrap 4. Follow-up in 1 week Electronic Signature(s) Signed: 11/27/2022 12:11:16 PM By: Geralyn Corwin DO Previous Signature: 11/18/2022 12:05:18 PM Version By: Geralyn Corwin DO Entered By: Geralyn Corwin on 11/23/2022 09:15:52 [name], [name] ([id number]) [id number] Page 7 of 8 -------------------------------------------------------------------------------- ROS/PFSH Details Patient Name: Date of Service: [name], [name]. 11/18/2022 8:45 [name] Medical Record  Number: [id number] Patient Account Number: [id number] Date of Birth/Sex: Treating RN: December 14, 1955 (67 y.o. Judie Petit) Yevonne Pax Primary Care Provider: Saralyn Pilar Other Clinician: Referring Provider: Treating Provider/Extender: Lise Auer in Treatment: 0 Integumentary (Skin) Complaints and Symptoms: Positive for: Wounds Respiratory Medical History: Positive for: Asthma Cardiovascular Medical History: Positive for: Hypertension Genitourinary Complaints and Symptoms: Review of System Notes: CKF Immunizations Pneumococcal Vaccine: Received Pneumococcal Vaccination: No Implantable Devices No devices added Family and Social History Never smoker; Marital Status - Married; Alcohol Use: Never; Drug Use: No History; Caffeine Use: Daily Electronic Signature(s) Signed: 11/18/2022 12:05:18 PM By: Geralyn Corwin DO Signed: 11/27/2022 11:59:03 AM By: Yevonne Pax RN Entered By: Yevonne Pax on 11/18/2022 06:14:12 -------------------------------------------------------------------------------- SuperBill Details Patient Name: Date of Service: [name], Antawan [name]. 11/18/2022 Medical Record Number: [id number] Patient Account Number: [id number] RHANDY, MORELOCK ([id number]) [id number] Page 8 of 8 Date of Birth/Sex: Treating RN: March 14, 1956 (67 y.o. 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, unspecified Facility Procedures : CPT4 Code: [id number] Description: 9213 - WOUND CARE VISIT-LEV 3 EST  PT Modifier: Quantity: 1 : CPT4 Code: [id number] 1 Description: 1104-Punch biopsy of skin (including simple closure, when performed) single lesion ICD-10 Diagnosis Description L97.818 Non-pressure chronic ulcer of other part of right lower leg with other specified severi Modifier: ty Quantity: 1 Physician Procedures : CPT4 Code Description Modifier [id number] - WC PHYS LEVEL 4 - NEW PT 25 ICD-10 Diagnosis Description I87.311 Chronic venous hypertension (idiopathic) with ulcer of right lower extremity L97.818 Non-pressure chronic ulcer of other part of right lower  leg with other specified severity 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, unspecified Quantity: 1 : 11104 Punch biopsy of skin (including simple closure, when performed) single lesion ICD-10 Diagnosis Description L97.818 Non-pressure chronic ulcer of other part of right lower leg with other specified severity Quantity: 1 Electronic Signature(s) Signed: 11/26/2022 12:40:06 PM By: Elliot Gurney, BSN, RN, CWS, Kim RN, BSN Signed: 11/27/2022 12:11:16 PM By: Geralyn Corwin DO Previous Signature: 11/19/2022 2:06:34 PM Version By: Yevonne Pax RN Previous Signature: 11/18/2022 12:05:18 PM Version By: Geralyn Corwin DO Entered By: Elliot Gurney BSN, RN, CWS, Kim on 11/26/2022 09:40:06

## 2022-11-27 NOTE — Progress Notes (Signed)
BUB, FALKOWITZ ([id number]) [id number] Page 1 of 9 Visit Report for 11/18/2022 Allergy List Details Patient Name: Date of Service: [name] [name], [name] [name]. 11/18/2022 8:45 A [name] Medical Record Number: [id number] Patient Account Number: [id number] Date of Birth/Sex: Treating [name]: 1955-07-10 (67 y.o. [name] [name]) Yevonne Pax Primary Care [name] [name]: Saralyn Pilar Other Clinician: Referring Jaystin Mcgarvey: Treating [name] [name]/Extender: [name] [name] in Treatment: 0 Allergies Active Allergies No Known Allergies Allergy Notes Electronic Signature(s) Signed: 11/27/2022 11:59:03 AM By: Yevonne Pax [name] Entered By: Yevonne Pax on 11/18/2022 06:11:52 -------------------------------------------------------------------------------- Arrival Information Details Patient Name: Date of Service: [name] [name], [name] [name]. 11/18/2022 8:45 A [name] Medical Record Number: [id number] Patient Account Number: [id number] Date of Birth/Sex: Treating [name]: March 27, 1956 (67 y.o. [name] [name] Primary Care [name] [name]: Saralyn Pilar Other Clinician: Referring [name] [name]: Treating [name] [name]/Extender: [name] [name] in Treatment: 0 Visit Information Patient Arrived: Ambulatory Arrival Time: 09:09 Accompanied By: wife Transfer Assistance: None Patient Identification Verified: Yes Secondary Verification Process Completed: Yes Patient Requires Transmission-Based Precautions: No Patient Has Alerts: No Electronic Signature(s) Signed: 11/27/2022 11:59:03 AM By: Yevonne Pax [name] Entered By: Yevonne Pax on 11/18/2022 06:09:42 [name] [name], [name] [name] ([id number]) [id number] Page 2 of 9 -------------------------------------------------------------------------------- Clinic Level of Care Assessment Details Patient Name: Date of Service: [name] [name], [name] [name] 11/18/2022 8:45 A [name] Medical Record Number: [id number] Patient Account Number: [id number] Date of  Birth/Sex: Treating [name]: 11-21-1955 (67 y.o. [name] [name] Primary Care [name] [name]: Saralyn Pilar Other Clinician: Referring [name] [name]: Treating [name] [name]/Extender: [name] [name] in Treatment: 0 Clinic Level of Care Assessment Items TOOL 1 Quantity Score []  - 0 Use when [name] and Procedure is performed on INITIAL visit ASSESSMENTS - Nursing Assessment / Reassessment X- 1 20 General Physical Exam (combine w/ comprehensive assessment (listed just below) when performed on new pt. evals) X- 1 25 Comprehensive Assessment (HX, ROS, Risk Assessments, Wounds Hx, etc.) ASSESSMENTS - Wound and Skin Assessment / Reassessment []  - 0 Dermatologic / Skin Assessment (not related to wound area) ASSESSMENTS - Ostomy and/or Continence Assessment and Care []  - 0 Incontinence Assessment and Management []  - 0 Ostomy Care Assessment and Management (repouching, etc.) PROCESS - Coordination of Care X - Simple Patient / Family Education for ongoing care 1 15 []  - 0 Complex (extensive) Patient / Family Education for ongoing care X- 1 10 Staff obtains Chiropractor, Records, T Results / Process Orders est []  - 0 Staff telephones HHA, Nursing Homes / Clarify orders / etc []  - 0 Routine Transfer to another Facility (non-emergent condition) []  - 0 Routine Hospital Admission (non-emergent condition) X- 1 15 New Admissions / Manufacturing engineer / Ordering NPWT Apligraf, etc. , []  - 0 Emergency Hospital Admission (emergent condition) PROCESS - Special Needs []  - 0 Pediatric / Minor Patient Management []  - 0 Isolation Patient Management []  - 0 Hearing / Language / Visual special needs []  - 0 Assessment of Community assistance (transportation, D/C planning, etc.) []  - 0 Additional assistance / Altered mentation []  - 0 Support Surface(s) Assessment (bed, cushion, seat, etc.) INTERVENTIONS - Miscellaneous []  - 0 External ear exam []  - 0 Patient Transfer  (multiple staff / Nurse, adult / Similar devices) []  - 0 Simple Staple / Suture removal (25 or less) []  - 0 Complex Staple / Suture removal (26 or more) []  - 0 Hypo/Hyperglycemic Management (do not check if billed separately) [name], [name] [name] ([id number]) [id number] Page 3 of 9 X- 1 15 Ankle / Brachial Index (ABI) - do not  check if billed separately Has the patient been seen at the hospital within the last three years: Yes Total Score: 100 Level Of Care: New/Established - Level 3 Electronic Signature(s) Signed: 11/27/2022 11:35:43 AM By: [name] [name], BSN, [name], CWS, [name], BSN Entered By: [name] [name], BSN, [name], CWS, [name] on 11/26/2022 09:39:58 -------------------------------------------------------------------------------- Encounter Discharge Information Details Patient Name: Date of Service: [name] [name], [name] [name]. 11/18/2022 8:45 A [name] Medical Record Number: [id number] Patient Account Number: [id number] Date of Birth/Sex: Treating [name]: 1955/06/25 (67 y.o. [name] [name]) Yevonne Pax Primary Care [name] [name]: Saralyn Pilar Other Clinician: Referring [name] [name]: Treating [name] [name]/Extender: [name] [name] in Treatment: 0 Encounter Discharge Information Items Post Procedure Vitals Discharge Condition: Stable Temperature (F): 97.4 Ambulatory Status: Ambulatory Pulse (bpm): 73 Discharge Destination: Home Respiratory Rate (breaths/min): 18 Transportation: Private Auto Blood Pressure (mmHg): 176/92 Accompanied By: self Schedule Follow-up Appointment: Yes Clinical Summary of Care: Electronic Signature(s) Signed: 11/19/2022 2:08:04 PM By: Yevonne Pax [name] Entered By: Yevonne Pax on 11/19/2022 11:08:03 -------------------------------------------------------------------------------- Lower Extremity Assessment Details Patient Name: Date of Service: [name] [name] 11/18/2022 8:45 A [name] Medical Record Number: [id number] Patient Account Number: [id number] Date of  Birth/Sex: Treating [name]: Oct 16, 1955 (67 y.o. [name] [name] Primary Care [name] [name]: Saralyn Pilar Other Clinician: Referring [name] [name]: Treating [name] [name]/Extender: [name] [name] in Treatment: 0 Edema Assessment Assessed: Kyra Searles: No] Franne Forts: No] Edema: [Left: Ye] [Right: s] Calf Ayler, [name] [name] ([id number]) [id number] Page 4 of 9 Left: Right: Point of Measurement: 32 cm From Medial Instep 37 cm Ankle Left: Right: Point of Measurement: 11 cm From Medial Instep 24 cm Knee To Floor Left: Right: From Medial Instep 42 cm Vascular Assessment Pulses: Dorsalis Pedis Palpable: [Right:Yes] Doppler Audible: [Right:Yes] Extremity colors, hair growth, and conditions: Extremity Color: [Right:Hyperpigmented] Hair Growth on Extremity: [Right:No] Temperature of Extremity: [Right:Warm] Capillary Refill: [Right:> 3 seconds] Dependent Rubor: [Right:No] Blanched when Elevated: [Right:No] Lipodermatosclerosis: [Right:No] Blood Pressure: Brachial: [Right:176] Ankle: [Right:Dorsalis Pedis: 140 0.80] Toe Nail Assessment Left: Right: Thick: Yes Discolored: Yes Deformed: Yes Improper Length and Hygiene: No Electronic Signature(s) Signed: 11/27/2022 11:59:03 AM By: Yevonne Pax [name] Entered By: Yevonne Pax on 11/18/2022 06:30:31 -------------------------------------------------------------------------------- Multi Wound Chart Details Patient Name: Date of Service: [name] [name], [name] [name]. 11/18/2022 8:45 A [name] Medical Record Number: [id number] Patient Account Number: [id number] Date of Birth/Sex: Treating [name]: 12/21/1955 (67 y.o. [name] [name]) Yevonne Pax Primary Care Marcel Sorter: Saralyn Pilar Other Clinician: Referring Keitra Carusone: Treating Kacey Vicuna/Extender: [name] [name] in Treatment: 0 Vital Signs Height(in): 67 Pulse(bpm): 73 Weight(lbs): 174 Blood Pressure(mmHg): 176/92 Body Mass Index(BMI):  27.2 Temperature(F): 97.4 Respiratory Rate(breaths/min): 18 [name] [name], [name] [name] ([id number]) [1:Photos:] [N/A:N/A] Right, Lateral Lower Leg N/A N/A Wound Location: Gradually Appeared N/A N/A Wounding Event: Atypical N/A N/A Primary Etiology: Asthma, Hypertension N/A N/A Comorbid History: 08/29/2022 N/A N/A Date Acquired: 0 N/A N/A Weeks of Treatment: Open N/A N/A Wound Status: No N/A N/A Wound Recurrence: Yes N/A N/A Clustered Wound: 11x11x0.4 N/A N/A Measurements L x W x D (cm) 95.033 N/A N/A A (cm) : rea 38.013 N/A N/A Volume (cm) : Full Thickness Without Exposed N/A N/A Classification: Support Structures Medium N/A N/A Exudate A mount: Serosanguineous N/A N/A Exudate Type: red, brown N/A N/A Exudate Color: Small (1-33%) N/A N/A Granulation Amount: Pink N/A N/A Granulation Quality: Large (67-100%) N/A N/A Necrotic Amount: Eschar, Adherent Slough N/A N/A Necrotic Tissue: Fat Layer (Subcutaneous Tissue): Yes N/A N/A Exposed Structures: Fascia: No Tendon: No Muscle: No Joint: No Bone: No None N/A N/A Epithelialization:  Treatment Notes Electronic Signature(s) Signed: 11/18/2022 12:05:18 PM By: Geralyn Corwin DO Entered By: Geralyn Corwin on 11/18/2022 07:29:44 -------------------------------------------------------------------------------- Multi-Disciplinary Care Plan Details Patient Name: Date of Service: [name] [name], Corney [name]. 11/18/2022 8:45 A [name] Medical Record Number: [id number] Patient Account Number: [id number] Date of Birth/Sex: Treating [name]: 03/25/1956 (67 y.o. [name] [name] Primary Care Pat Elicker: Saralyn Pilar Other Clinician: Referring Marda Breidenbach: Treating Johathan Province/Extender: [name] [name] in Treatment: 0 Active Inactive Necrotic Tissue Nursing Diagnoses: Knowledge deficit related to management of necrotic/devitalized tissue Goals: [name] [name], [name] [name] ([id number]) [id number] Page 6 of  9 Patient/caregiver will verbalize understanding of reason and process for debridement of necrotic tissue Date Initiated: 11/18/2022 Target Resolution Date: 12/19/2022 Goal Status: Active Interventions: Assess patient pain level pre-, during and post procedure and prior to discharge Notes: Wound/Skin Impairment Nursing Diagnoses: Knowledge deficit related to ulceration/compromised skin integrity Goals: Patient/caregiver will verbalize understanding of skin care regimen Date Initiated: 11/18/2022 Target Resolution Date: 12/19/2022 Goal Status: Active Ulcer/skin breakdown will have a volume reduction of 30% by week 4 Date Initiated: 11/18/2022 Target Resolution Date: 12/19/2022 Goal Status: Active Ulcer/skin breakdown will have a volume reduction of 50% by week 8 Date Initiated: 11/18/2022 Target Resolution Date: 01/18/2023 Goal Status: Active Ulcer/skin breakdown will have a volume reduction of 80% by week 12 Date Initiated: 11/18/2022 Target Resolution Date: 02/18/2023 Goal Status: Active Ulcer/skin breakdown will heal within 14 weeks Date Initiated: 11/18/2022 Target Resolution Date: 03/20/2023 Goal Status: Active Interventions: Assess patient/caregiver ability to obtain necessary supplies Assess patient/caregiver ability to perform ulcer/skin care regimen upon admission and as needed Assess ulceration(s) every visit Notes: Electronic Signature(s) Signed: 11/27/2022 11:59:03 AM By: Yevonne Pax [name] Entered By: Yevonne Pax on 11/18/2022 06:31:37 -------------------------------------------------------------------------------- Pain Assessment Details Patient Name: Date of Service: [name] [name], Welborn [name]. 11/18/2022 8:45 A [name] Medical Record Number: [id number] Patient Account Number: [id number] Date of Birth/Sex: Treating [name]: 04-[name]-1956 (67 y.o. [name] [name] Primary Care Elene Downum: Saralyn Pilar Other Clinician: Referring Shawneen Deetz: Treating Colie Josten/Extender: [name] [name] in Treatment: 0 Active Problems Location of Pain Severity and Description of Pain Patient Has Paino No Site Locations [name] [name], [name] [name] [place] ([id number]) [id number] Page 7 of 9 Pain Management and Medication Current Pain Management: Electronic Signature(s) Signed: 11/27/2022 11:59:03 AM By: Yevonne Pax [name] Entered By: Yevonne Pax on 11/18/2022 06:10:35 -------------------------------------------------------------------------------- Patient/Caregiver Education Details Patient Name: Date of Service: [name] [name] 8/21/2024andnbsp8:45 A [name] Medical Record Number: [id number] Patient Account Number: [id number] Date of Birth/Gender: Treating [name]: 10-31-1955 (67 y.o. [name] [name]) Yevonne Pax Primary Care Physician: Saralyn Pilar Other Clinician: Referring Physician: Treating Physician/Extender: [name] [name] in Treatment: 0 Education Assessment Education Provided To: Patient Education Topics Provided Welcome T [facility] Packet: o Handouts: [facility] P[facility] form Methods: Explain/Verbal Responses: State content correctly Electronic Signature(s) Signed: 11/27/2022 11:59:03 AM By: Yevonne Pax [name] Entered By: Yevonne Pax on 11/18/2022 06:31:52 [name] [name], [name] [name] ([id number]) [id number] Page 8 of 9 -------------------------------------------------------------------------------- Wound Assessment Details Patient Name: Date of Service: [name] [name], [name] [name] 11/18/2022 8:45 A [name] Medical Record Number: [id number] Patient Account Number: [id number] Date of Birth/Sex: Treating [name]: 12-30-1955 (67 y.o. [name] [name]) Yevonne Pax Primary Care Ketara Cavness: Saralyn Pilar Other Clinician: Referring Gurkaran Rahm: Treating Khyla Mccumbers/Extender: [name] [name] in Treatment: 0 Wound Status Wound Number: 1 Primary Etiology: Atypical Wound Location:  Right, Lateral Lower Leg Wound Status: Open Wounding Event: Gradually Appeared Comorbid History: Asthma, Hypertension Date Acquired: 08/29/2022 Weeks Of Treatment:  0 Clustered Wound: Yes Photos Wound Measurements Length: (cm) 11 Width: (cm) 11 Depth: (cm) 0.4 Area: (cm) 95.033 Volume: (cm) 38.013 % Reduction in Area: % Reduction in Volume: Epithelialization: None Tunneling: No Undermining: No Wound Description Classification: Full Thickness Without Exposed Suppor Exudate Amount: Medium Exudate Type: Serosanguineous Exudate Color: red, brown t Structures Foul Odor After Cleansing: No Slough/Fibrino Yes Wound Bed Granulation Amount: Small (1-33%) Exposed Structure Granulation Quality: Pink Fascia Exposed: No Necrotic Amount: Large (67-100%) Fat Layer (Subcutaneous Tissue) Exposed: Yes Necrotic Quality: Eschar, Adherent Slough Tendon Exposed: No Muscle Exposed: No Joint Exposed: No Bone Exposed: No Electronic Signature(s) Signed: 11/27/2022 11:59:03 AM By: Yevonne Pax [name] Entered By: Yevonne Pax on 11/18/2022 06:28:15 Rybolt, May [name] ([id number]) [id number] Page 9 of 9 -------------------------------------------------------------------------------- Vitals Details Patient Name: Date of Service: [name] [name], SLOMINSKI. 11/18/2022 8:45 A [name] Medical Record Number: [id number] Patient Account Number: [id number] Date of Birth/Sex: Treating [name]: [birth year] (67 y.o. [name] [name]) Yevonne Pax Primary Care Ashlen Kiger: Saralyn Pilar Other Clinician: Referring Samra Pesch: Treating Edris Friedt/Extender: [name] [name] in Treatment: 0 Vital Signs Time Taken: 09:00 Temperature (F): 97.4 Height (in): 67 Pulse (bpm): 73 Source: Stated Respiratory Rate (breaths/min): 18 Weight (lbs): 174 Blood Pressure (mmHg): 176/92 Body Mass Index (BMI): 27.2 Reference Range: 80 - 120 mg / dl Electronic Signature(s) Signed: 11/27/2022 11:59:03 AM By: Yevonne Pax [name] Entered By: Yevonne Pax on 11/18/2022 06:11:33

## 2022-11-27 NOTE — Progress Notes (Signed)
[name], KISSELL ([id number]) [phone] [id number] Page 1 of 5 Visit Report for 11/18/2022 Abuse Risk Screen Details Patient Name: Date of Service: MARCANGELO, HEMMING. 11/18/2022 8:45 A M Medical Record Number: [id number] Patient Account Number: [id number] Date of Birth/Sex: Treating RN: 1956-02-21 (67 y.o. [name]) Yevonne Pax Primary Care Fayne Mcguffee: Saralyn Pilar Other Clinician: Referring Erum Cercone: Treating Sadler Teschner/Extender: Lise Auer in Treatment: 0 Abuse Risk Screen Items Answer ABUSE RISK SCREEN: Has anyone close to you tried to hurt or harm you recentlyo No Do you feel uncomfortable with anyone in your familyo No Has anyone forced you do things that you didnt want to doo No Electronic Signature(s) Signed: 11/27/2022 11:59:03 AM By: Yevonne Pax RN Entered By: Yevonne Pax on 11/18/2022 06:14:20 -------------------------------------------------------------------------------- Activities of Daily Living Details Patient Name: Date of Service: [name] 11/18/2022 8:45 A M Medical Record Number: [id number] Patient Account Number: [id number] Date of Birth/Sex: Treating RN: [birth year] (67 y.o. [name]) Yevonne Pax Primary Care Latiya Navia: Saralyn Pilar Other Clinician: Referring Jovonne Wilton: Treating Eleonora Peeler/Extender: Lise Auer in Treatment: 0 Activities of Daily Living Items Answer Activities of Daily Living (Please select one for each item) Drive Automobile Completely Able T Medications ake Completely Able Use T elephone Completely Able Care for Appearance Completely Able Use T oilet Completely Able Bath / Shower Completely Able Dress Self Completely Able Feed Self Completely Able Walk Completely Able Get In / Out Bed Completely Able Housework Completely JOUSHA, CRABB ([id number]) [phone] [id number] Page 2 of 5 Prepare Meals Completely  Able Handle Money Completely Able Shop for Self Completely Able Electronic Signature(s) Signed: 11/27/2022 11:59:03 AM By: Yevonne Pax RN Entered By: Yevonne Pax on 11/18/2022 06:14:51 -------------------------------------------------------------------------------- Education Screening Details Patient Name: Date of Service: [name], Junaid M. 11/18/2022 8:45 A M Medical Record Number: [id number] Patient Account Number: [id number] Date of Birth/Sex: Treating RN: 1956/02/24 (67 y.o. [name]) Yevonne Pax Primary Care Adore Kithcart: Saralyn Pilar Other Clinician: Referring Trinty Marken: Treating Tryniti Laatsch/Extender: Lise Auer in Treatment: 0 Primary Learner Assessed: Patient Learning Preferences/Education Level/Primary Language Learning Preference: Explanation Highest Education Level: College or Above Preferred Language: English Cognitive Barrier Language Barrier: No Translator Needed: No Memory Deficit: No Emotional Barrier: No Cultural/Religious Beliefs Affecting Medical Care: No Physical Barrier Impaired Vision: No Impaired Hearing: Yes Hearing Aid, both Decreased Hand dexterity: No Knowledge/Comprehension Knowledge Level: Medium Comprehension Level: High Ability to understand written instructions: High Ability to understand verbal instructions: High Motivation Anxiety Level: Anxious Cooperation: Cooperative Education Importance: Acknowledges Need Interest in Health Problems: Asks Questions Perception: Coherent Willingness to Engage in Self-Management High Activities: Readiness to Engage in Self-Management High Activities: Electronic Signature(s) Signed: 11/27/2022 11:59:03 AM By: Yevonne Pax RN Entered By: Yevonne Pax on 11/18/2022 06:15:27 Soy, [name] ([id number]) [id number] Page 3 of 5 -------------------------------------------------------------------------------- Fall Risk Assessment Details Patient  Name: Date of Service: [name]. 11/18/2022 8:45 A M Medical Record Number: [id number] Patient Account Number: [id number] Date of Birth/Sex: Treating RN: 22-Oct-1955 (67 y.o. [name]) Yevonne Pax Primary Care Stashia Sia: Saralyn Pilar Other Clinician: Referring Sarahy Creedon: Treating Vrishank Moster/Extender: Lise Auer in Treatment: 0 Fall Risk Assessment Items Have you had 2 or more falls in the last 12 monthso 0 No Have you had any fall that resulted in injury in the last 12 monthso 0 No FALLS RISK SCREEN History of falling - immediate or within 3 months 0 No Secondary diagnosis (Do you have 2 or more medical  diagnoseso) 0 No Ambulatory aid None/bed rest/wheelchair/nurse 0 No Crutches/cane/walker 0 No Furniture 0 No Intravenous therapy Access/Saline/Heparin Lock 0 No Gait/Transferring Normal/ bed rest/ wheelchair 0 No Weak (short steps with or without shuffle, stooped but able to lift head while walking, may seek 0 No support from furniture) Impaired (short steps with shuffle, may have difficulty arising from chair, head down, impaired 0 No balance) Mental Status Oriented to own ability 0 No Electronic Signature(s) Signed: 11/27/2022 11:59:03 AM By: Yevonne Pax RN Entered By: Yevonne Pax on 11/18/2022 06:15:48 -------------------------------------------------------------------------------- Foot Assessment Details Patient Name: Date of Service: [name], [name] 11/18/2022 8:45 A M Medical Record Number: [id number] Patient Account Number: [id number] Date of Birth/Sex: Treating RN: 31-Aug-1955 (67 y.o. [name] Primary Care Ranesha Val: Saralyn Pilar Other Clinician: Referring Vela Render: Treating Litzy Dicker/Extender: Lise Auer in Treatment: 0 Foot Assessment Items [x]  Unable to perform due to altered mental [address] [place], [place] ([id number]) [id number]  Page 4 of 5 + = Sensation present, - = Sensation absent, C = Callus, U = Ulcer R = Redness, W = Warmth, M = Maceration, PU = Pre-ulcerative lesion F = Fissure, S = Swelling, D = Dryness Assessment Right: Left: Other Deformity: No No Prior Foot Ulcer: No No Prior Amputation: No No Charcot Joint: No No Ambulatory Status: Ambulatory Without Help Gait: Steady Notes patient unable to hear Electronic Signature(s) Signed: 11/27/2022 11:59:03 AM By: Yevonne Pax RN Entered By: Yevonne Pax on 11/18/2022 06:19:57 -------------------------------------------------------------------------------- Nutrition Risk Screening Details Patient Name: Date of Service: [name], Beverly M. 11/18/2022 8:45 A M Medical Record Number: [id number] Patient Account Number: [id number] Date of Birth/Sex: Treating RN: 05/13/1955 (67 y.o. [name]) Yevonne Pax Primary Care Saleah Rishel: Saralyn Pilar Other Clinician: Referring Sokhna Christoph: Treating Saloma Cadena/Extender: Lise Auer in Treatment: 0 Height (in): 67 Weight (lbs): 174 Body Mass Index (BMI): 27.2 Nutrition Risk Screening Items Score Screening NUTRITION RISK SCREEN: I have an illness or condition that made me change the kind and/or amount of food I eat 0 No I eat fewer than two meals per day 0 No I eat few fruits and vegetables, or milk products 0 No Vilar, [name] ([id number]) [id number] Page 5 of 5 I have three or more drinks of beer, liquor or wine almost every day 0 No I have tooth or mouth problems that make it hard for me to eat 0 No I don't always have enough money to buy the food I need 0 No I eat alone most of the time 0 No I take three or more different prescribed or over-the-counter drugs a day 1 Yes Without wanting to, I have lost or gained 10 pounds in the last six months 0 No I am not always physically able to shop, cook and/or feed myself 0 No Nutrition Protocols Good Risk  Protocol 0 No interventions needed Moderate Risk Protocol High Risk Proctocol Risk Level: Good Risk Score: 1 Electronic Signature(s) Signed: 11/27/2022 11:59:03 AM By: Yevonne Pax RN Entered By: Yevonne Pax on 11/18/2022 06:16:01

## 2022-11-27 NOTE — Progress Notes (Signed)
[name] ([id number]) [id number] Page 1 of 9 Visit Report for 11/25/2022 Arrival Information Details Patient Name: Date of Service: [name], [name] 11/25/2022 8:00 A M Medical Record Number: [id number] Patient Account Number: [id number] Date of Birth/Sex: Treating RN: 1955/04/02 (67 y.o. Judie Petit) [name] Primary Care [name]: [name] Other Clinician: Referring [name]: Treating [name]/Extender: [name] in Treatment: 1 Visit Information History Since Last Visit Added or deleted any medications: No Patient Arrived: Ambulatory Any new allergies or adverse reactions: No Arrival Time: 08:07 Had a fall or experienced change in No Accompanied By: self activities of daily living that may affect Transfer Assistance: None risk of falls: Patient Identification Verified: Yes Signs or symptoms of abuse/neglect since last visito No Secondary Verification Process Completed: Yes Hospitalized since last visit: No Patient Requires Transmission-Based Precautions: No Implantable device outside of the clinic excluding No Patient Has Alerts: No cellular tissue based products placed in the center since last visit: Has Dressing in Place as Prescribed: Yes Pain Present Now: No Electronic Signature(s) Signed: 11/27/2022 11:58:03 AM By: [name] Pax RN Entered By: [name] on 11/25/2022 05:07:48 -------------------------------------------------------------------------------- Clinic Level of Care Assessment Details Patient Name: Date of Service: [name], [name]. 11/25/2022 8:00 A M Medical Record Number: [id number] Patient Account Number: [id number] Date of Birth/Sex: Treating RN: [birth year] (67 y.o. Judie Petit) [name] Primary Care [name]: [name] Other Clinician: Referring Chaney Maclaren: Treating [name]/Extender: [name] in Treatment: 1 Clinic Level of Care  Assessment Items TOOL 4 Quantity Score X- 1 0 Use when only an EandM is performed on FOLLOW-UP visit ASSESSMENTS - Nursing Assessment / Reassessment X- 1 10 Reassessment of Co-morbidities (includes updates in patient status) X- 1 5 Reassessment of Adherence to Treatment Plan [name], [name] ([id number]) [id number] Page 2 of 9 ASSESSMENTS - Wound and Skin A ssessment / Reassessment X - Simple Wound Assessment / Reassessment - one wound 1 5 []  - 0 Complex Wound Assessment / Reassessment - multiple wounds []  - 0 Dermatologic / Skin Assessment (not related to wound area) ASSESSMENTS - Focused Assessment []  - 0 Circumferential Edema Measurements - multi extremities []  - 0 Nutritional Assessment / Counseling / Intervention []  - 0 Lower Extremity Assessment (monofilament, tuning fork, pulses) []  - 0 Peripheral Arterial Disease Assessment (using hand held doppler) ASSESSMENTS - Ostomy and/or Continence Assessment and Care []  - 0 Incontinence Assessment and Management []  - 0 Ostomy Care Assessment and Management (repouching, etc.) PROCESS - Coordination of Care X - Simple Patient / Family Education for ongoing care 1 15 []  - 0 Complex (extensive) Patient / Family Education for ongoing care X- 1 10 Staff obtains Chiropractor, Records, T Results / Process Orders est []  - 0 Staff telephones HHA, Nursing Homes / Clarify orders / etc []  - 0 Routine Transfer to another Facility (non-emergent condition) []  - 0 Routine Hospital Admission (non-emergent condition) []  - 0 New Admissions / Manufacturing engineer / Ordering NPWT Apligraf, etc. , []  - 0 Emergency Hospital Admission (emergent condition) X- 1 10 Simple Discharge Coordination []  - 0 Complex (extensive) Discharge Coordination PROCESS - Special Needs []  - 0 Pediatric / Minor Patient Management []  - 0 Isolation Patient Management []  - 0 Hearing / Language / Visual special needs []  - 0 Assessment  of Community assistance (transportation, D/C planning, etc.) []  - 0 Additional assistance / Altered mentation []  - 0 Support Surface(s) Assessment (bed, cushion, seat, etc.) INTERVENTIONS - Wound Cleansing / Measurement X -  Simple Wound Cleansing - one wound 1 5 []  - 0 Complex Wound Cleansing - multiple wounds X- 1 5 Wound Imaging (photographs - any number of wounds) []  - 0 Wound Tracing (instead of photographs) X- 1 5 Simple Wound Measurement - one wound []  - 0 Complex Wound Measurement - multiple wounds INTERVENTIONS - Wound Dressings X - Small Wound Dressing one or multiple wounds 1 10 []  - 0 Medium Wound Dressing one or multiple wounds []  - 0 Large Wound Dressing one or multiple wounds []  - 0 Application of Medications - topical []  - 0 Application of Medications - injection INTERVENTIONS - Miscellaneous []  - 0 External ear exam [name] ([id number]) [id number] Page 3 of 9 []  - 0 Specimen Collection (cultures, biopsies, blood, body fluids, etc.) []  - 0 Specimen(s) / Culture(s) sent or taken to Lab for analysis []  - 0 Patient Transfer (multiple staff / Michiel Sites Lift / Similar devices) []  - 0 Simple Staple / Suture removal (25 or less) []  - 0 Complex Staple / Suture removal (26 or more) []  - 0 Hypo / Hyperglycemic Management (close monitor of Blood Glucose) []  - 0 Ankle / Brachial Index (ABI) - do not check if billed separately X- 1 5 Vital Signs Has the patient been seen at the hospital within the last three years: Yes Total Score: 85 Level Of Care: New/Established - Level 3 Electronic Signature(s) Unsigned Previous Signature: 11/27/2022 11:58:03 AM Version By: [name] Pax RN Entered By: Elliot Gurney, BSN, RN, CWS, Kim on 11/27/2022 09:07:53 -------------------------------------------------------------------------------- Encounter Discharge Information Details Patient Name: Date of Service: [name], [name] 11/25/2022 8:00 A M Medical  Record Number: [id number] Patient Account Number: [id number] Date of Birth/Sex: Treating RN: 12/14/1955 (67 y.o. [name] Primary Care Nadeen Shipman: [name] Other Clinician: Referring Jakiah Goree: Treating Meral Geissinger/Extender: [name] in Treatment: 1 Encounter Discharge Information Items Discharge Condition: Stable Ambulatory Status: Ambulatory Discharge Destination: Home Transportation: Private Auto Accompanied By: wife Schedule Follow-up Appointment: Yes Clinical Summary of Care: Electronic Signature(s) Signed: 11/25/2022 12:48:23 PM By: [name] Pax RN Entered By: [name] on 11/25/2022 09:48:23 [place], [name] ([id number]) [id number] Page 4 of 9 -------------------------------------------------------------------------------- Lower Extremity Assessment Details Patient Name: Date of Service: [name], [name]. 11/25/2022 8:00 A M Medical Record Number: [id number] Patient Account Number: [id number] Date of Birth/Sex: Treating RN: 17-Jun-1955 (67 y.o. Judie Petit) [name] Primary Care Burnetta Kohls: [name] Other Clinician: Referring Talesha Ellithorpe: Treating Manessa Buley/Extender: [name] in Treatment: 1 Edema Assessment Assessed: [name]: No] Franne Forts: No] Edema: [Left: Ye] [Right: s] Calf Left: Right: Point of Measurement: 32 cm From Medial Instep 37 cm Ankle Left: Right: Point of Measurement: 11 cm From Medial Instep 24 cm Knee To Floor Left: Right: From Medial Instep 42 cm Vascular Assessment Pulses: Dorsalis Pedis Palpable: [Right:Yes] Extremity colors, hair growth, and conditions: Hair Growth on Extremity: [Right:No] Temperature of Extremity: [Right:Warm] Capillary Refill: [Right:< 3 seconds] Dependent Rubor: [Right:No] Blanched when Elevated: [Right:No No] Toe Nail Assessment Left: Right: Thick: Yes Discolored: Yes Deformed: Yes Improper Length and  Hygiene: Yes Electronic Signature(s) Signed: 11/27/2022 11:58:03 AM By: [name] Pax RN Entered By: [name] on 11/25/2022 05:19:55 -------------------------------------------------------------------------------- Multi Wound Chart Details Patient Name: Date of Service: [name], [name] 11/25/2022 8:00 A M Medical Record Number: [id number] Patient Account Number: [id number] Date of Birth/Sex: Treating RN: 1955-08-02 (67 y.o. Judie Petit) [name] Primary Care Coburn Knaus: [name] Other Clinician: Referring Lirio Bach: Treating Jaedan Schuman/Extender: [name]  in Treatment: 1 Marciano, Kendrik M ([id number]) [id number] Page 5 of 9 Vital Signs Height(in): 67 Pulse(bpm): 65 Weight(lbs): 174 Blood Pressure(mmHg): 157/80 Body Mass Index(BMI): 27.2 Temperature(F): 98.2 Respiratory Rate(breaths/min): 18 [1:Photos:] [N/A:N/A] Right, Lateral Lower Leg N/A N/A Wound Location: Gradually Appeared N/A N/A Wounding Event: Atypical N/A N/A Primary Etiology: Asthma, Hypertension N/A N/A Comorbid History: 08/29/2022 N/A N/A Date Acquired: 1 N/A N/A Weeks of Treatment: Open N/A N/A Wound Status: No N/A N/A Wound Recurrence: Yes N/A N/A Clustered Wound: 11x11x0.4 N/A N/A Measurements L x W x D (cm) 95.033 N/A N/A A (cm) : rea 38.013 N/A N/A Volume (cm) : 0.00% N/A N/A % Reduction in A rea: 0.00% N/A N/A % Reduction in Volume: Full Thickness Without Exposed N/A N/A Classification: Support Structures Medium N/A N/A Exudate A mount: Serosanguineous N/A N/A Exudate Type: red, brown N/A N/A Exudate Color: None Present (0%) N/A N/A Granulation Amount: Large (67-100%) N/A N/A Necrotic Amount: Eschar, Adherent Slough N/A N/A Necrotic Tissue: Fat Layer (Subcutaneous Tissue): Yes N/A N/A Exposed Structures: Fascia: No Tendon: No Muscle: No Joint: No Bone: No None N/A N/A Epithelialization: Treatment  Notes Electronic Signature(s) Signed: 11/27/2022 11:58:03 AM By: [name] Pax RN Entered By: [name] on 11/25/2022 05:20:00 -------------------------------------------------------------------------------- Multi-Disciplinary Care Plan Details Patient Name: Date of Service: [name], [name] 11/25/2022 8:00 A M Medical Record Number: [id number] Patient Account Number: [id number] Date of Birth/Sex: Treating RN: 1955/12/16 (67 y.o. [name] Primary Care Trenace Coughlin: [name] Other Clinician: Referring Rachna Schonberger: Treating Keishawn Rajewski/Extender: [name] in Treatment: 1 Cossin, [name] ([id number]) [id number] Page 6 of 9 Active Inactive Necrotic Tissue Nursing Diagnoses: Knowledge deficit related to management of necrotic/devitalized tissue Goals: Patient/caregiver will verbalize understanding of reason and process for debridement of necrotic tissue Date Initiated: 11/18/2022 Target Resolution Date: 12/19/2022 Goal Status: Active Interventions: Assess patient pain level pre-, during and post procedure and prior to discharge Notes: Wound/Skin Impairment Nursing Diagnoses: Knowledge deficit related to ulceration/compromised skin integrity Goals: Patient/caregiver will verbalize understanding of skin care regimen Date Initiated: 11/18/2022 Target Resolution Date: 12/19/2022 Goal Status: Active Ulcer/skin breakdown will have a volume reduction of 30% by week 4 Date Initiated: 11/18/2022 Target Resolution Date: 12/19/2022 Goal Status: Active Ulcer/skin breakdown will have a volume reduction of 50% by week 8 Date Initiated: 11/18/2022 Target Resolution Date: 01/18/2023 Goal Status: Active Ulcer/skin breakdown will have a volume reduction of 80% by week 12 Date Initiated: 11/18/2022 Target Resolution Date: 02/18/2023 Goal Status: Active Ulcer/skin breakdown will heal within 14 weeks Date Initiated:  11/18/2022 Target Resolution Date: 03/20/2023 Goal Status: Active Interventions: Assess patient/caregiver ability to obtain necessary supplies Assess patient/caregiver ability to perform ulcer/skin care regimen upon admission and as needed Assess ulceration(s) every visit Notes: Electronic Signature(s) Signed: 11/27/2022 11:58:03 AM By: [name] Pax RN Entered By: [name] on 11/25/2022 05:20:15 -------------------------------------------------------------------------------- Pain Assessment Details Patient Name: Date of Service: [name], [name] 11/25/2022 8:00 A M Medical Record Number: [id number] Patient Account Number: [id number] Date of Birth/Sex: Treating RN: [birth year] (67 y.o. Judie Petit) [name] Primary Care Mazi Schuff: [name] Other Clinician: Referring Khamya Topp: Treating Tashanti Dalporto/Extender: [name] in Treatment: 1 Active Problems Catapano, [name] ([id number]) [id number] Page 7 of 9 Location of Pain Severity and Description of Pain Patient Has Paino No Site Locations Pain Management and Medication Current Pain Management: Electronic Signature(s) Signed: 11/27/2022 11:58:03 AM By: [name] Pax RN Entered By: [name] on 11/25/2022 05:08:49 -------------------------------------------------------------------------------- Patient/Caregiver Education Details Patient Name: Date  of Service: [name], [name] 8/28/2024andnbsp8:00 A M Medical Record Number: [id number] Patient Account Number: [id number] Date of Birth/Gender: Treating RN: 1955-06-10 (67 y.o. Judie Petit) [name] Primary Care Physician: [name] Other Clinician: Referring Physician: Treating Physician/Extender: [name] in Treatment: 1 Education Assessment Education Provided To: Patient Education Topics Provided Wound/Skin Impairment: Handouts: Caring for Your Ulcer Methods:  Explain/Verbal Responses: State content correctly Electronic Signature(s) Signed: 11/27/2022 11:58:03 AM By: [name] Pax RN Entered By: [name] on 11/25/2022 05:21:14 [name], [name] ([id number]) [id number] Page 8 of 9 -------------------------------------------------------------------------------- Wound Assessment Details Patient Name: Date of Service: [name], MERKER. 11/25/2022 8:00 A M Medical Record Number: [id number] Patient Account Number: [id number] Date of Birth/Sex: Treating RN: 1955-09-09 (67 y.o. Judie Petit) [name] Primary Care Petrona Wyeth: [name] Other Clinician: Referring Braxtyn Bojarski: Treating Sianne Tejada/Extender: [name] in Treatment: 1 Wound Status Wound Number: 1 Primary Etiology: Atypical Wound Location: Right, Lateral Lower Leg Wound Status: Open Wounding Event: Gradually Appeared Comorbid History: Asthma, Hypertension Date Acquired: 08/29/2022 Weeks Of Treatment: 1 Clustered Wound: Yes Photos Wound Measurements Length: (cm) 11 Width: (cm) 11 Depth: (cm) 0.4 Area: (cm) 95.033 Volume: (cm) 38.013 % Reduction in Area: 0% % Reduction in Volume: 0% Epithelialization: None Tunneling: No Undermining: No Wound Description Classification: Full Thickness Without Exposed Suppor Exudate Amount: Medium Exudate Type: Serosanguineous Exudate Color: red, brown t Structures Foul Odor After Cleansing: No Slough/Fibrino Yes Wound Bed Granulation Amount: None Present (0%) Exposed Structure Necrotic Amount: Large (67-100%) Fascia Exposed: No Necrotic Quality: Eschar, Adherent Slough Fat Layer (Subcutaneous Tissue) Exposed: Yes Tendon Exposed: No Muscle Exposed: No Joint Exposed: No Bone Exposed: No Treatment Notes Wound #1 (Lower Leg) Wound Laterality: Right, Lateral Cleanser [place] Ancillary Kit - 15 Day Supply Discharge Instruction: Use supplies as instructed; Kit contains: (15) Saline  Bullets; (15) 3x3 Gauze; 15 pr Gloves Vashe 5.8 (oz) Hartl, [name] ([id number]) [id number] Page 9 of 9 Discharge Instruction: Use vashe 5.8 (oz) as directed Peri-Wound Care Topical Primary Dressing Gauze Discharge Instruction: moistened with vashe Solution Secondary Dressing ABD Pad 5x9 (in/in) Discharge Instruction: Cover with ABD pad Secured With Compression Wrap Compression Stockings Add-Ons Electronic Signature(s) Signed: 11/27/2022 11:58:03 AM By: [name] Pax RN Entered By: [name] on 11/25/2022 05:18:22 -------------------------------------------------------------------------------- Vitals Details Patient Name: Date of Service: [name], Damani M. 11/25/2022 8:00 A M Medical Record Number: [id number] Patient Account Number: [id number] Date of Birth/Sex: Treating RN: 11/23/1955 (67 y.o. Judie Petit) [name] Primary Care Terika Pillard: [name] Other Clinician: Referring Kharisma Glasner: Treating Telia Amundson/Extender: [name] in Treatment: 1 Vital Signs Time Taken: 08:07 Temperature (F): 98.2 Height (in): 67 Pulse (bpm): 65 Weight (lbs): 174 Respiratory Rate (breaths/min): 18 Body Mass Index (BMI): 27.2 Blood Pressure (mmHg): 157/80 Reference Range: 80 - 120 mg / dl Electronic Signature(s) Signed: 11/27/2022 11:58:03 AM By: [name] Pax RN Entered By: [name] on 11/25/2022 22:02:54

## 2022-11-27 NOTE — Progress Notes (Signed)
[name] ([id number]) [id number] Page 1 of 6 Visit Report for 11/25/2022 Chief Complaint Document Details Patient Name: Date of Service: [name]. 11/25/2022 8:00 A M Medical Record Number: [id number] Patient Account Number: [id number] Date of Birth/Sex: Treating RN: [birth year] (67 y.o. [name]) Yevonne Pax Primary Care Provider: Saralyn Pilar Other Clinician: Referring Provider: Treating Provider/Extender: Lise Auer in Treatment: 1 Information Obtained from: Patient Chief Complaint 11/18/2022; right lower extremity wounds Electronic Signature(s) Signed: 11/25/2022 9:26:19 AM By: Geralyn Corwin DO Entered By: Geralyn Corwin on 11/25/2022 06:04:28 -------------------------------------------------------------------------------- HPI Details Patient Name: Date of Service: [name], [name] 11/25/2022 8:00 A M Medical Record Number: [id number] Patient Account Number: [id number] Date of Birth/Sex: Treating RN: [birth year] (67 y.o. [name] Primary Care Provider: Saralyn Pilar Other Clinician: Referring Provider: Treating Provider/Extender: Lise Auer in Treatment: 1 History of Present Illness HPI Description: 11/18/2022 [name] is a 67 year old male with a past medical history of nerve conduction deafness that presents to the clinic for a 7 month history of nonhealing ulcers to the right lower extremity. He states these happened spontaneously and have progressively gotten worse. The more proximal wound has been present for 7 months and the most recent wound on the top of the foot/distal leg has been present for the past month. He has 5 wounds total. He visited the ED on 11/04/2022 for this issue. He was given cephalexin for which he completed. He has been using Several ointments at different times to address this issue. These ointments include Silvadene,  mupirocin and clobetasol.. At times he keeps the wounds open to air. He also states he will go in the pool with the wounds exposed. For work he is on his feet for long periods of time. He does not wear compression stockings. ABIs in office were 0.8. He currently denies signs of infection. 8/28; patient presents for follow-up. He has been using Vashe wet-to-dry dressings to the wound beds. Patient's wound culture showed Pseudomonas aeruginosa sensitive to ciprofloxacin and this was sent into the pharmacy. Also recommended Keystone antibiotic ointment And this has already been ordered. He has not received this yet. He had a skin biopsy done as well that showed fibrosis inflammation. This was nonspecific. Patient declined debridement due to chronic pain. Electronic Signature(s) [name] ([id number]) [id number] Page 2 of 6 Signed: 11/25/2022 9:26:19 AM By: Geralyn Corwin DO Entered By: Geralyn Corwin on 11/25/2022 06:06:24 -------------------------------------------------------------------------------- Physical Exam Details Patient Name: Date of Service: [name], [name] 11/25/2022 8:00 A M Medical Record Number: [id number] Patient Account Number: [id number] Date of Birth/Sex: Treating RN: [birth year] (67 y.o. [name] Primary Care Provider: Saralyn Pilar Other Clinician: Referring Provider: Treating Provider/Extender: Lise Auer in Treatment: 1 Constitutional . Cardiovascular . Psychiatric . Notes Right lower extremity: T the distal aspect there are 5 open wounds with nonviable tissue throughout. 2+ pitting edema to the knee. Venous stasis dermatitis o with varicosities noted. Easily palpable dorsalis pedal pulses and posterior tibial pulses. Electronic Signature(s) Signed: 11/25/2022 9:26:19 AM By: Geralyn Corwin DO Entered By: Geralyn Corwin on 11/25/2022  06:07:00 -------------------------------------------------------------------------------- Physician Orders Details Patient Name: Date of Service: [name], [name] 11/25/2022 8:00 A M Medical Record Number: [id number] Patient Account Number: [id number] Date of Birth/Sex: Treating RN: 1956/01/21 (67 y.o. 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 Return Appointment in 2 weeks. Bathing/ Shower/ Hygiene May shower; gently cleanse wound with antibacterial soap, rinse and pat dry prior to dressing wounds Anesthetic (Use 'Patient Medications' Section for Anesthetic Order Entry) Injectable lidocaine applied before procedure. Edema Control - Lymphedema / Segmental Compressive Device / Other Elevate, Exercise Daily and A void Standing for Long Periods of Time. Elevate legs to the level of the heart and pump ankles as often as possible Elevate leg(s) parallel to the floor when sitting. Wound Treatment Wound #1 - Lower Leg Wound Laterality: Right, Lateral Cleanser: Byram Ancillary Kit - 15 Day Supply (Generic) 1 x Per Day/30 Days Discharge Instructions: Use supplies as instructed; Kit contains: (15) Saline Bullets; (15) 3x3 Gauze; 15 pr Gloves Cleanser: Vashe 5.8 (oz) 1 x Per Day/30 Days Discharge Instructions: Use vashe 5.8 (oz) as directed Prim Dressing: Gauze (Generic) 1 x Per Day/30  Days ary Discharge Instructions: moistened with vashe Solution Secondary Dressing: ABD Pad 5x9 (in/in) (Generic) 1 x Per Day/30 Days Discharge Instructions: Cover with ABD pad Electronic Signature(s) Signed: 11/25/2022 12:47:41 PM By: Yevonne Pax RN Signed: 11/25/2022 2:40:32 PM By: Geralyn Corwin DO Entered By: Yevonne Pax on 11/25/2022 09:47:41 -------------------------------------------------------------------------------- Problem List Details Patient Name: Date of Service: [name], [name] 11/25/2022 8:00 A M Medical Record Number: [id number] Patient Account Number: [id number] Date of Birth/Sex: Treating RN: 12/21/1955 (67 y.o. 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 11/25/2022 8:00 A M Medical Record Number: [id number] Patient Account Number: [id number] Date of Birth/Sex: Treating RN: [birth year] (67 y.o. [name] Primary Care Provider: Saralyn Pilar Other Clinician: Referring Provider: Treating Provider/Extender: Lise Auer in Treatment: 1 Subjective Chief Complaint Information obtained from Patient 11/18/2022; right lower extremity wounds History of Present Illness (HPI) 11/18/2022 Mr. Christerpher Kishi is a 67 year old male with a past medical history of nerve conduction deafness that presents to the clinic for a 7 month history of nonhealing ulcers to the right lower extremity. He states these happened spontaneously and have progressively gotten worse. The more proximal wound has been present for 7 months and the most recent wound on the top of the foot/distal leg has been present for the past month. He has 5 wounds total. He visited the ED on 11/04/2022 for this issue. He was given cephalexin for which he completed. He has been using Several ointments at different times to address this issue. These ointments include Silvadene, mupirocin and clobetasol.. At times he keeps the wounds open to air. He  also states he will go in the pool with the wounds exposed. For work he is on his feet for long periods of time. He does not wear compression stockings. ABIs in office were 0.8. He currently denies signs of infection. 8/28; patient presents for follow-up. He has been using Vashe wet-to-dry dressings to the wound beds. Patient's wound culture showed Pseudomonas aeruginosa sensitive to ciprofloxacin and this was sent into the pharmacy. Also recommended Keystone antibiotic ointment And this has already been ordered. He has not received this yet. He had a skin biopsy done as well that showed fibrosis inflammation. This was nonspecific. Patient declined debridement due to chronic pain. Objective Constitutional Vitals Time Taken: 8:07 AM, Height: 67 in, Weight: 174 lbs, BMI: 27.2, Temperature: 98.2 F, Pulse: 65 bpm, Respiratory Rate: 18 breaths/min, Blood Pressure: 157/80 mmHg. General Notes: Right lower extremity: T the distal aspect there are 5 open wounds with nonviable tissue throughout. 2+ pitting edema to the  knee. Venous o stasis dermatitis with varicosities noted. Easily palpable dorsalis pedal pulses and posterior tibial pulses. Integumentary (Hair, Skin) Wound #1 status is Open. Original cause of wound was Gradually Appeared. The date acquired was: 08/29/2022. The wound has been in treatment 1 weeks. The wound is located on the Right,Lateral Lower Leg. The wound measures 11cm length x 11cm width x 0.4cm depth; 95.033cm^2 area and 38.013cm^3 volume. There is Fat Layer (Subcutaneous Tissue) exposed. There is no tunneling or undermining noted. There is a medium amount of serosanguineous drainage noted. There is no granulation within the wound bed. There is a large (67-100%) amount of necrotic tissue within the wound bed including Eschar and Adherent Slough. 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 He is currently taking ciprofloxacin due to culture results. Unfortunately due to the significant bioburden to the wound beds the wounds will likely get worse before better due to dirusption of endotoxins. He could not tolerate debridement today due to chronic pain. Also recommended Keystone antibiotic ointment which has been ordered. We gave him the number to call if he does not hear from them by the end of the week so he can get this sent to him. Biopsy nonspecific. Wounds are is likely venous ulcers however still on the differential is pyoderma gangrenosum versus other autoimmune etiologies. Plan 1. Vashe wet-to-dry dressings 2. Ciprofloxacin 3. Start Keystone antibiotic ointment when this arrives and use Vashe just for  cleaning 4. Follow-up in 1 week Electronic Signature(s) Signed: 11/25/2022 9:26:19 AM By: Geralyn Corwin DO Entered By: Geralyn Corwin on 11/25/2022 06:25:06 -------------------------------------------------------------------------------- ROS/PFSH Details Patient Name: Date of Service: [name], [name] 11/25/2022 8:00 A M Medical Record Number: [id number] Patient Account Number: [id number] Date of Birth/Sex: Treating RN: 1955-09-12 (67 y.o. [name]) Yevonne Pax Primary Care Provider: Saralyn Pilar Other Clinician: Referring Provider: Treating Provider/Extender: Lise Auer in Treatment: 1 Respiratory Medical History: Positive for: Asthma Cardiovascular Medical History: Positive for: Hypertension Immunizations Pneumococcal Vaccine: Received Pneumococcal Vaccination: No Implantable Devices No devices added Family and Social History Never smoker; Marital Status - Married; Alcohol Use: Never; Drug Use: No History; Caffeine Use: Daily ERHARDT, MANCIL ([id number]) [id number] Page 6 of 6 Electronic Signature(s) Signed: 11/25/2022 9:26:19 AM By: Geralyn Corwin  DO Signed: 11/27/2022 11:58:03 AM By: Yevonne Pax RN Entered By: Geralyn Corwin on 11/25/2022 06:26:01 -------------------------------------------------------------------------------- SuperBill Details Patient Name: Date of Service: [name], [name] 11/25/2022 Medical Record Number: [id number] Patient Account Number: [id number] Date of Birth/Sex: Treating RN: 1956/03/06 (67 y.o. 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, unspecified Facility Procedures : CPT4 Code: [id number] Description: 99213 - WOUND CARE VISIT-LEV 3 EST PT Modifier: Quantity: 1 Physician Procedures : CPT4 Code Description Modifier [id number] - WC PHYS LEVEL 4 - EST PT ICD-10 Diagnosis Description I87.311 Chronic venous hypertension (idiopathic) with ulcer of right lower extremity L97.818 Non-pressure chronic ulcer of other part of right lower  leg with other specified severity 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

## 2022-12-02 ENCOUNTER — Encounter: Payer: BC Managed Care – PPO | Attending: Internal Medicine | Admitting: Internal Medicine

## 2022-12-02 DIAGNOSIS — L97818 Non-pressure chronic ulcer of other part of right lower leg with other specified severity: Secondary | ICD-10-CM | POA: Diagnosis not present

## 2022-12-02 DIAGNOSIS — H908 Mixed conductive and sensorineural hearing loss, unspecified: Secondary | ICD-10-CM | POA: Insufficient documentation

## 2022-12-02 DIAGNOSIS — I10 Essential (primary) hypertension: Secondary | ICD-10-CM | POA: Diagnosis not present

## 2022-12-02 DIAGNOSIS — J45909 Unspecified asthma, uncomplicated: Secondary | ICD-10-CM | POA: Diagnosis not present

## 2022-12-02 DIAGNOSIS — I87311 Chronic venous hypertension (idiopathic) with ulcer of right lower extremity: Secondary | ICD-10-CM | POA: Insufficient documentation

## 2022-12-03 NOTE — Progress Notes (Signed)
KEATS, CAMERINO ([id number]) [id number] Page 1 of 9 Visit Report for 12/02/2022 Arrival Information Details Patient Name: Date of Service: [name], [name]. 12/02/2022 3:45 PM Medical Record Number: [id number] Patient Account Number: [id number] Date of Birth/Sex: Treating RN: 1955/04/09 (67 y.o. Judie Petit) Yevonne Pax Primary Care Laurie Lovejoy: Saralyn Pilar Other Clinician: Referring Layken Beg: Treating Nekayla Heider/Extender: Lise Auer in Treatment: 2 Visit Information History Since Last Visit Added or deleted any medications: No Patient Arrived: Ambulatory Any new allergies or adverse reactions: No Arrival Time: 15:44 Had a fall or experienced change in No Accompanied By: self activities of daily living that may affect Transfer Assistance: None risk of falls: Patient Identification Verified: Yes Signs or symptoms of abuse/neglect since last visito No Secondary Verification Process Completed: Yes Hospitalized since last visit: No Patient Requires Transmission-Based Precautions: No Implantable device outside of the clinic excluding No Patient Has Alerts: No cellular tissue based products placed in the center since last visit: Has Dressing in Place as Prescribed: Yes Pain Present Now: No Electronic Signature(s) Signed: 12/03/2022 1:40:54 PM By: Yevonne Pax RN Entered By: Yevonne Pax on 12/02/2022 12:45:32 -------------------------------------------------------------------------------- Clinic Level of Care Assessment Details Patient Name: Date of Service: ARAFAT, [name] 12/02/2022 3:45 PM Medical Record Number: [id number] Patient Account Number: [id number] Date of Birth/Sex: Treating RN: 1955-07-18 (67 y.o. Judie Petit) Yevonne Pax Primary Care Hether Anselmo: Saralyn Pilar Other Clinician: Referring Kymberlee Viger: Treating Kreed Kauffman/Extender: Lise Auer in Treatment: 2 Clinic Level of Care Assessment  Items TOOL 4 Quantity Score X- 1 0 Use when only an EandM is performed on FOLLOW-UP visit ASSESSMENTS - Nursing Assessment / Reassessment X- 1 10 Reassessment of Co-morbidities (includes updates in patient status) X- 1 5 Reassessment of Adherence to Treatment Plan KAYNAN, FAIRBURN ([id number]) [id number] Page 2 of 9 ASSESSMENTS - Wound and Skin A ssessment / Reassessment X - Simple Wound Assessment / Reassessment - one wound 1 5 []  - 0 Complex Wound Assessment / Reassessment - multiple wounds []  - 0 Dermatologic / Skin Assessment (not related to wound area) ASSESSMENTS - Focused Assessment []  - 0 Circumferential Edema Measurements - multi extremities []  - 0 Nutritional Assessment / Counseling / Intervention []  - 0 Lower Extremity Assessment (monofilament, tuning fork, pulses) []  - 0 Peripheral Arterial Disease Assessment (using hand held doppler) ASSESSMENTS - Ostomy and/or Continence Assessment and Care []  - 0 Incontinence Assessment and Management []  - 0 Ostomy Care Assessment and Management (repouching, etc.) PROCESS - Coordination of Care X - Simple Patient / Family Education for ongoing care 1 15 []  - 0 Complex (extensive) Patient / Family Education for ongoing care []  - 0 Staff obtains Chiropractor, Records, T Results / Process Orders est []  - 0 Staff telephones HHA, Nursing Homes / Clarify orders / etc []  - 0 Routine Transfer to another Facility (non-emergent condition) []  - 0 Routine Hospital Admission (non-emergent condition) []  - 0 New Admissions / Manufacturing engineer / Ordering NPWT Apligraf, etc. , []  - 0 Emergency Hospital Admission (emergent condition) X- 1 10 Simple Discharge Coordination []  - 0 Complex (extensive) Discharge Coordination PROCESS - Special Needs []  - 0 Pediatric / Minor Patient Management []  - 0 Isolation Patient Management []  - 0 Hearing / Language / Visual special needs []  - 0 Assessment of Community  assistance (transportation, D/C planning, etc.) []  - 0 Additional assistance / Altered mentation []  - 0 Support Surface(s) Assessment (bed, cushion, seat, etc.) INTERVENTIONS - Wound Cleansing / Measurement X - Simple Wound  Cleansing - one wound 1 5 []  - 0 Complex Wound Cleansing - multiple wounds X- 1 5 Wound Imaging (photographs - any number of wounds) []  - 0 Wound Tracing (instead of photographs) X- 1 5 Simple Wound Measurement - one wound []  - 0 Complex Wound Measurement - multiple wounds INTERVENTIONS - Wound Dressings X - Small Wound Dressing one or multiple wounds 1 10 []  - 0 Medium Wound Dressing one or multiple wounds []  - 0 Large Wound Dressing one or multiple wounds []  - 0 Application of Medications - topical []  - 0 Application of Medications - injection INTERVENTIONS - Miscellaneous []  - 0 External ear exam SUNIL, ZEPP ([id number]) [id number] Page 3 of 9 []  - 0 Specimen Collection (cultures, biopsies, blood, body fluids, etc.) []  - 0 Specimen(s) / Culture(s) sent or taken to Lab for analysis []  - 0 Patient Transfer (multiple staff / Michiel Sites Lift / Similar devices) []  - 0 Simple Staple / Suture removal (25 or less) []  - 0 Complex Staple / Suture removal (26 or more) []  - 0 Hypo / Hyperglycemic Management (close monitor of Blood Glucose) []  - 0 Ankle / Brachial Index (ABI) - do not check if billed separately X- 1 5 Vital Signs Has the patient been seen at the hospital within the last three years: Yes Total Score: 75 Level Of Care: New/Established - Level 2 Electronic Signature(s) Signed: 12/03/2022 1:40:54 PM By: Yevonne Pax RN Entered By: Yevonne Pax on 12/02/2022 13:18:15 -------------------------------------------------------------------------------- Encounter Discharge Information Details Patient Name: Date of Service: [name] Rodney, [name] M. 12/02/2022 3:45 PM Medical Record Number: [id number] Patient Account Number:  [id number] Date of Birth/Sex: Treating RN: 1956-02-22 (67 y.o. [name] Primary Care Alvin Diffee: Saralyn Pilar Other Clinician: Referring Wiktoria Hemrick: Treating Juanjesus Pepperman/Extender: Lise Auer in Treatment: 2 Encounter Discharge Information Items Discharge Condition: Stable Ambulatory Status: Ambulatory Discharge Destination: Home Transportation: Private Auto Accompanied By: wife Schedule Follow-up Appointment: Yes Clinical Summary of Care: Electronic Signature(s) Signed: 12/02/2022 4:19:10 PM By: Yevonne Pax RN Entered By: Yevonne Pax on 12/02/2022 13:19:10 -------------------------------------------------------------------------------- Lower Extremity Assessment Details Patient Name: Date of Service: [name] Rodney, Elray M. 12/02/2022 3:45 PM Ibbotson, [name] ([id number]) [id number] Page 4 of 9 Medical Record Number: [id number] Patient Account Number: [id number] Date of Birth/Sex: Treating RN: 1955/12/11 (67 y.o. Judie Petit) Yevonne Pax Primary Care Jenaye Rickert: Saralyn Pilar Other Clinician: Referring Rhilynn Preyer: Treating Zhamir Pirro/Extender: Lise Auer in Treatment: 2 Edema Assessment Assessed: Kyra Searles: No] Franne Forts: No] Edema: [Left: Ye] [Right: s] Calf Left: Right: Point of Measurement: 32 cm From Medial Instep 37 cm Ankle Left: Right: Point of Measurement: 11 cm From Medial Instep 24 cm Vascular Assessment Pulses: Dorsalis Pedis Palpable: [Right:Yes] Extremity colors, hair growth, and conditions: Extremity Color: [Right:Hyperpigmented] Temperature of Extremity: [Right:Warm] Capillary Refill: [Right:< 3 seconds] Dependent Rubor: [Right:No] Blanched when Elevated: [Right:No No] Toe Nail Assessment Left: Right: Thick: No Discolored: No Deformed: No Improper Length and Hygiene: No Electronic Signature(s) Signed: 12/03/2022 1:40:54 PM By: Yevonne Pax RN Entered By: Yevonne Pax on  12/02/2022 13:00:27 -------------------------------------------------------------------------------- Multi Wound Chart Details Patient Name: Date of Service: [name] Rodney, Janari M. 12/02/2022 3:45 PM Medical Record Number: [id number] Patient Account Number: [id number] Date of Birth/Sex: Treating RN: 05/14/1955 (67 y.o. [name] Primary Care Henry Demeritt: Saralyn Pilar Other Clinician: Referring Tyreisha Ungar: Treating Mulki Roesler/Extender: Lise Auer in Treatment: 2 Vital Signs Height(in): 67 Pulse(bpm): 76 Weight(lbs): 174 Blood Pressure(mmHg): 120/71 Body Mass Index(BMI): 27.2 Temperature(F): 97.6 Respiratory Rate(breaths/min): 18  ANTHONIO, LESTAGE ([id number]) [id number] Page 5 of 9 [1:Photos:] [N/A:N/A] Right, Lateral Lower Leg N/A N/A Wound Location: Gradually Appeared N/A N/A Wounding Event: Atypical N/A N/A Primary Etiology: Asthma, Hypertension N/A N/A Comorbid History: 08/29/2022 N/A N/A Date Acquired: 2 N/A N/A Weeks of Treatment: Open N/A N/A Wound Status: No N/A N/A Wound Recurrence: Yes N/A N/A Clustered Wound: 11x5x0.4 N/A N/A Measurements L x W x D (cm) 43.197 N/A N/A A (cm) : rea 17.279 N/A N/A Volume (cm) : 54.50% N/A N/A % Reduction in A rea: 54.50% N/A N/A % Reduction in Volume: Full Thickness Without Exposed N/A N/A Classification: Support Structures Medium N/A N/A Exudate A mount: Serosanguineous N/A N/A Exudate Type: red, brown N/A N/A Exudate Color: None Present (0%) N/A N/A Granulation Amount: Large (67-100%) N/A N/A Necrotic Amount: Eschar, Adherent Slough N/A N/A Necrotic Tissue: Fat Layer (Subcutaneous Tissue): Yes N/A N/A Exposed Structures: Fascia: No Tendon: No Muscle: No Joint: No Bone: No None N/A N/A Epithelialization: Treatment Notes Electronic Signature(s) Signed: 12/03/2022 1:40:54 PM By: Yevonne Pax RN Entered By: Yevonne Pax on 12/02/2022  13:00:31 -------------------------------------------------------------------------------- Multi-Disciplinary Care Plan Details Patient Name: Date of Service: [name] Rodney, Keiland M. 12/02/2022 3:45 PM Medical Record Number: [id number] Patient Account Number: [id number] Date of Birth/Sex: Treating RN: 02-15-1956 (67 y.o. [name] Primary Care Kyliah Deanda: Saralyn Pilar Other Clinician: Referring Corbin Hott: Treating Edelin Fryer/Extender: Lise Auer in Treatment: 2 Active Inactive Necrotic Tissue Nursing Diagnoses: ESTANISLAO, TORSTENSON ([id number]) [id number] Page 6 of 9 Knowledge deficit related to management of necrotic/devitalized tissue Goals: Patient/caregiver will verbalize understanding of reason and process for debridement of necrotic tissue Date Initiated: 11/18/2022 Target Resolution Date: 12/19/2022 Goal Status: Active Interventions: Assess patient pain level pre-, during and post procedure and prior to discharge Notes: Wound/Skin Impairment Nursing Diagnoses: Knowledge deficit related to ulceration/compromised skin integrity Goals: Patient/caregiver will verbalize understanding of skin care regimen Date Initiated: 11/18/2022 Target Resolution Date: 12/19/2022 Goal Status: Active Ulcer/skin breakdown will have a volume reduction of 30% by week 4 Date Initiated: 11/18/2022 Target Resolution Date: 12/19/2022 Goal Status: Active Ulcer/skin breakdown will have a volume reduction of 50% by week 8 Date Initiated: 11/18/2022 Target Resolution Date: 01/18/2023 Goal Status: Active Ulcer/skin breakdown will have a volume reduction of 80% by week 12 Date Initiated: 11/18/2022 Target Resolution Date: 02/18/2023 Goal Status: Active Ulcer/skin breakdown will heal within 14 weeks Date Initiated: 11/18/2022 Target Resolution Date: 03/20/2023 Goal Status: Active Interventions: Assess patient/caregiver ability to obtain necessary  supplies Assess patient/caregiver ability to perform ulcer/skin care regimen upon admission and as needed Assess ulceration(s) every visit Notes: Electronic Signature(s) Signed: 12/03/2022 1:40:54 PM By: Yevonne Pax RN Entered By: Yevonne Pax on 12/02/2022 13:00:55 -------------------------------------------------------------------------------- Pain Assessment Details Patient Name: Date of Service: [name] 12/02/2022 3:45 PM Medical Record Number: [id number] Patient Account Number: [id number] Date of Birth/Sex: Treating RN: 06-27-1955 (67 y.o. [name] Primary Care Wendall Isabell: Saralyn Pilar Other Clinician: Referring Tahni Porchia: Treating Leandrew Keech/Extender: Lise Auer in Treatment: 2 Active Problems Location of Pain Severity and Description of Pain Patient Has Paino No Site Locations [place], [place] ([id number]) [id number] Page 7 of 9 Pain Management and Medication Current Pain Management: Electronic Signature(s) Signed: 12/03/2022 1:40:54 PM By: Yevonne Pax RN Entered By: Yevonne Pax on 12/02/2022 12:46:05 -------------------------------------------------------------------------------- Patient/Caregiver Education Details Patient Name: Date of Service: [name] 9/4/2024andnbsp3:45 PM Medical Record Number: [id number] Patient Account Number: [id number] Date of Birth/Gender: Treating RN: [birth year] (67 y.o.  [name] Primary Care Physician: Saralyn Pilar Other Clinician: Referring Physician: Treating Physician/Extender: Lise Auer in Treatment: 2 Education Assessment Education Provided To: Patient Education Topics Provided Wound/Skin Impairment: Handouts: Caring for Your Ulcer Methods: Explain/Verbal Responses: State content correctly Electronic Signature(s) Signed: 12/03/2022 1:40:54 PM By: Yevonne Pax RN Entered By: Yevonne Pax on  12/02/2022 13:01:18 Latka, [name] ([id number]) [id number] Page 8 of 9 -------------------------------------------------------------------------------- Wound Assessment Details Patient Name: Date of Service: MARDELL, HILLERS 12/02/2022 3:45 PM Medical Record Number: [id number] Patient Account Number: [id number] Date of Birth/Sex: Treating RN: 09/19/1955 (67 y.o. Judie Petit) Yevonne Pax Primary Care Mozell Haber: Saralyn Pilar Other Clinician: Referring Kanyah Matsushima: Treating Kamber Vignola/Extender: Lise Auer in Treatment: 2 Wound Status Wound Number: 1 Primary Etiology: Atypical Wound Location: Right, Lateral Lower Leg Wound Status: Open Wounding Event: Gradually Appeared Comorbid History: Asthma, Hypertension Date Acquired: 08/29/2022 Weeks Of Treatment: 2 Clustered Wound: Yes Photos Wound Measurements Length: (cm) 11 Width: (cm) 11 Depth: (cm) 0.4 Area: (cm) 95.033 Volume: (cm) 38.013 % Reduction in Area: 0% % Reduction in Volume: 0% Epithelialization: None Tunneling: No Undermining: No Wound Description Classification: Full Thickness Without Exposed Suppor Exudate Amount: Medium Exudate Type: Serosanguineous Exudate Color: red, brown t Structures Foul Odor After Cleansing: No Slough/Fibrino Yes Wound Bed Granulation Amount: None Present (0%) Exposed Structure Necrotic Amount: Large (67-100%) Fascia Exposed: No Necrotic Quality: Eschar Fat Layer (Subcutaneous Tissue) Exposed: Yes Tendon Exposed: No Muscle Exposed: No Joint Exposed: No Bone Exposed: No Treatment Notes Wound #1 (Lower Leg) Wound Laterality: Right, Lateral Cleanser Peri-Wound Care Topical Jerson, Kittle [name] ([id number]) [id number] Page 9 of 9 Primary Dressing Secondary Dressing Secured With Compression Wrap Compression Stockings Add-Ons Electronic Signature(s) Signed: 12/02/2022 4:21:03 PM By: Yevonne Pax RN Entered By:  Yevonne Pax on 12/02/2022 13:21:03 -------------------------------------------------------------------------------- Vitals Details Patient Name: Date of Service: [name] Rodney, Taequan M. 12/02/2022 3:45 PM Medical Record Number: [id number] Patient Account Number: [id number] Date of Birth/Sex: Treating RN: 01-24-1956 (67 y.o. Judie Petit) Yevonne Pax Primary Care Mathhew Buysse: Saralyn Pilar Other Clinician: Referring Syrenity Klepacki: Treating Klayton Monie/Extender: Lise Auer in Treatment: 2 Vital Signs Time Taken: 15:45 Temperature (F): 97.6 Height (in): 67 Pulse (bpm): 76 Weight (lbs): 174 Respiratory Rate (breaths/min): 18 Body Mass Index (BMI): 27.2 Blood Pressure (mmHg): 120/71 Reference Range: 80 - 120 mg / dl Electronic Signature(s) Signed: 12/03/2022 1:40:54 PM By: Yevonne Pax RN Entered By: Yevonne Pax on 12/02/2022 12:45:57

## 2022-12-04 NOTE — Progress Notes (Signed)
[name] ([id number]) [id number] Page 1 of 6 Visit Report for 12/02/2022 Chief Complaint Document Details Patient Name: Date of Service: [name], [name]. 12/02/2022 3:45 PM Medical Record Number: [id number] Patient Account Number: [id number] Date of Birth/Sex: Treating RN: 1955-08-09 (67 y.o. Judie Petit) Yevonne Pax Primary Care Provider: Saralyn Pilar Other Clinician: Referring Provider: Treating Provider/Extender: Lise Auer in Treatment: 2 Information Obtained from: Patient Chief Complaint 11/18/2022; right lower extremity wounds Electronic Signature(s) Signed: 12/02/2022 4:18:13 PM By: Geralyn Corwin DO Entered By: Geralyn Corwin on 12/02/2022 13:14:25 -------------------------------------------------------------------------------- HPI Details Patient Name: Date of Service: [name], [name] 12/02/2022 3:45 PM Medical Record Number: [id number] Patient Account Number: [id number] Date of Birth/Sex: Treating RN: 04-27-1955 (67 y.o. [name] Primary Care Provider: Saralyn Pilar Other Clinician: Referring Provider: Treating Provider/Extender: Lise Auer in Treatment: 2 History of Present Illness HPI Description: 11/18/2022 [name] is a 67 year old male with a past medical history of nerve conduction deafness that presents to the clinic for a 7 month history of nonhealing ulcers to the right lower extremity. He states these happened spontaneously and have progressively gotten worse. The more proximal wound has been present for 7 months and the most recent wound on the top of the foot/distal leg has been present for the past month. He has 5 wounds total. He visited the ED on 11/04/2022 for this issue. He was given cephalexin for which he completed. He has been using Several ointments at different times to address this issue. These ointments include Silvadene, mupirocin  and clobetasol.. At times he keeps the wounds open to air. He also states he will go in the pool with the wounds exposed. For work he is on his feet for long periods of time. He does not wear compression stockings. ABIs in office were 0.8. He currently denies signs of infection. 8/28; patient presents for follow-up. He has been using Vashe wet-to-dry dressings to the wound beds. Patient's wound culture showed Pseudomonas aeruginosa sensitive to ciprofloxacin and this was sent into the pharmacy. Also recommended Keystone antibiotic ointment And this has already been ordered. He has not received this yet. He had a skin biopsy done as well that showed fibrosis inflammation. This was nonspecific. Patient declined debridement due to chronic pain. 9/4; patient presents for follow-up. He has been using Vashe wet-to-dry dressings to the wound beds. He obtains the [facility] antibiotic ointment tomorrow. He has 1 day left of his oral antibiotics. There has been improvement in size since last clinic visit to the wound beds. He currently denies systemic signs of infection. [name] ([id number]) [id number] Page 2 of 6 Electronic Signature(s) Signed: 12/02/2022 4:18:13 PM By: Geralyn Corwin DO Entered By: Geralyn Corwin on 12/02/2022 13:15:03 -------------------------------------------------------------------------------- Physical Exam Details Patient Name: Date of Service: [name] Bores. 12/02/2022 3:45 PM Medical Record Number: [id number] Patient Account Number: [id number] Date of Birth/Sex: Treating RN: 05/12/1955 (67 y.o. [name] Primary Care Provider: Saralyn Pilar Other Clinician: Referring Provider: Treating Provider/Extender: Lise Auer in Treatment: 2 Constitutional . Cardiovascular . Psychiatric . Notes Right lower extremity: T the distal aspect there are 5 open wounds with nonviable tissue throughout. 2+  pitting edema to the knee. Venous stasis dermatitis o with varicosities noted. Easily palpable dorsalis pedal pulses and posterior tibial pulses. No active acute signs of infection to the soft tissue. Electronic Signature(s) Signed: 12/02/2022 4:18:13 PM By: Geralyn Corwin DO Entered By: Geralyn Corwin on  12/02/2022 13:15:39 -------------------------------------------------------------------------------- Physician Orders Details Patient Name: Date of Service: [name] 12/02/2022 3:45 PM Medical Record Number: [id number] Patient Account Number: [id number] Date of Birth/Sex: Treating RN: Aug 24, 1955 (67 y.o. 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 Bathing/ Shower/ Hygiene May shower; gently cleanse wound with antibacterial soap, rinse and pat dry prior to dressing wounds Anesthetic (Use 'Patient Medications' Section for Anesthetic Order Entry) Lidocaine applied to wound bed Edema Control - Lymphedema / Segmental Compressive Device / Other Elevate, Exercise Daily and A void Standing for Long Periods of Time. Elevate legs to the level of the heart and pump ankles as often as possible Elevate leg(s) parallel to the floor when sitting. Wound Treatment Wound #1 - Lower Leg Wound Laterality: Right, Lateral Cleanser: Byram Ancillary Kit - 15 Day Supply (Generic) 1 x Per Day/30  Days Discharge Instructions: Use supplies as instructed; Kit contains: (15) Saline Bullets; (15) 3x3 Gauze; 15 pr Gloves Cleanser: Vashe 5.8 (oz) 1 x Per Day/30 Days Discharge Instructions: Use vashe 5.8 (oz) as directed Prim Dressing: Gauze (Generic) 1 x Per Day/30 Days ary Discharge Instructions: moistened with vashe Solution until keystone arrives then dc vashe and replace with keystone w/d Secondary Dressing: ABD Pad 5x9 (in/in) (Generic) 1 x Per Day/30 Days Discharge Instructions: Cover with ABD pad Electronic Signature(s) Signed: 12/02/2022 4:23:31 PM By: Yevonne Pax RN Signed: 12/03/2022 5:30:08 PM By: Geralyn Corwin DO Entered By: Yevonne Pax on 12/02/2022 13:23:31 -------------------------------------------------------------------------------- Problem List Details Patient Name: Date of Service: [name], [name] 12/02/2022 3:45 PM Medical Record Number: [id number] Patient Account Number: [id number] Date of Birth/Sex: Treating RN: 1956-02-28 (67 y.o. 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 12/02/2022 3:45 PM Medical Record Number: [id number] Patient Account Number: [id number] Date of Birth/Sex: Treating RN: 06/01/1955 (67 y.o. [name] Primary Care Provider: Saralyn Pilar Other Clinician: Referring Provider: Treating Provider/Extender: Lise Auer in Treatment: 2 Subjective Chief Complaint Information obtained from Patient 11/18/2022; right lower extremity wounds History of Present Illness (HPI) 11/18/2022 [name] is a 67 year old male with a past medical history of nerve conduction deafness that presents to the clinic for a 7 month history of nonhealing ulcers to the right lower extremity. He states these happened spontaneously and have progressively gotten worse. The more proximal wound has been present for 7 months and the most recent wound on the top  of the foot/distal leg has been present for the past month. He has 5 wounds total. He visited the ED on 11/04/2022 for this issue. He was given cephalexin for which he completed. He has been using Several ointments at different times to address this issue. These ointments include Silvadene, mupirocin and clobetasol.. At times he keeps the wounds open to air. He also states he will go in the pool with the wounds exposed. For work he is on his feet for long periods of time. He does not wear compression stockings. ABIs in office were 0.8. He currently denies signs of infection. 8/28; patient presents for follow-up. He has been using Vashe wet-to-dry dressings to the wound beds. Patient's wound culture showed Pseudomonas aeruginosa sensitive to ciprofloxacin and this was sent into the pharmacy. Also recommended Keystone antibiotic ointment And this has already been ordered. He has not received this yet. He had a skin biopsy done as well that showed fibrosis inflammation. This was nonspecific. Patient declined debridement due to chronic pain. 9/4; patient  presents for follow-up. He has been using Vashe wet-to-dry dressings to the wound beds. He obtains the [facility] antibiotic ointment tomorrow. He has 1 day left of his oral antibiotics. There has been improvement in size since last clinic visit to the wound beds. He currently denies systemic signs of infection. Objective Constitutional Vitals Time Taken: 3:45 PM, Height: 67 in, Weight: 174 lbs, BMI: 27.2, Temperature: 97.6 F, Pulse: 76 bpm, Respiratory Rate: 18 breaths/min, Blood Pressure: 120/71 mmHg. General Notes: Right lower extremity: T the distal aspect there are 5 open wounds with nonviable tissue throughout. 2+ pitting edema to the knee. Venous o stasis dermatitis with varicosities noted. Easily palpable dorsalis pedal pulses and posterior tibial pulses. No active acute signs of infection to the soft tissue. MEAGAN, WALLING ([id number]) [id number] Page 5 of 6 Integumentary (Hair, Skin) Wound #1 status is Open. Original cause of wound was Gradually Appeared. The date acquired was: 08/29/2022. The wound has been in treatment 2 weeks. The wound is located on the Right,Lateral Lower Leg. The wound measures 11cm length x 5cm width x 0.4cm depth; 43.197cm^2 area and 17.279cm^3 volume. There is Fat Layer (Subcutaneous Tissue) exposed. There is no tunneling or undermining noted. There is a medium amount of serosanguineous drainage noted. There is no granulation within the wound bed. There is a large (67-100%) amount of necrotic tissue within the wound bed including Eschar and Adherent Slough. 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 I recommended he complete his oral antibiotics. At this time  no evidence to continue oral antibiotics. He will receive his Keystone antibiotic ointment tomorrow and I recommended starting this. He can use Vashe to clean the wound beds. Follow-up in 1 week. Plan 1. Vashe wet-to-dry dressings until [facility] antibiotic ointment arrives and can use this daily instead. 2. Follow-up in 1 week Electronic Signature(s) Signed: 12/02/2022 4:18:13 PM By: Geralyn Corwin DO Entered By: Geralyn Corwin on 12/02/2022 13:17:23 -------------------------------------------------------------------------------- ROS/PFSH Details Patient Name: Date of Service: [name], [name] 12/02/2022 3:45 PM Medical Record Number: [id number] Patient Account Number: [id number] Date of Birth/Sex: Treating RN: Aug 12, 1955 (67 y.o. [name] Primary Care Provider: Saralyn Pilar Other Clinician: Referring Provider: Treating Provider/Extender: Lise Auer in Treatment: 2 Respiratory Medical  History: Positive for: Asthma Cardiovascular Medical History: Positive for: Hypertension Immunizations Pneumococcal Vaccine: Received Pneumococcal Vaccination: No Implantable Devices No devices added EARLIS, BIZZELL ([id number]) [id number] Page 6 of 6 Family and Social History Never smoker; Marital Status - Married; Alcohol Use: Never; Drug Use: No History; Caffeine Use: Daily Electronic Signature(s) Signed: 12/02/2022 4:18:13 PM By: Geralyn Corwin DO Signed: 12/03/2022 1:40:54 PM By: Yevonne Pax RN Entered By: Geralyn Corwin on 12/02/2022 13:17:52 -------------------------------------------------------------------------------- SuperBill Details Patient Name: Date of Service: [name], [name] 12/02/2022 Medical Record Number: [id number] Patient Account Number: [id number] Date of Birth/Sex: Treating RN: 1955/04/25 (67 y.o. 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, unspecified Facility Procedures : CPT4 Code: [id number] Description: [phone] - WOUND CARE VISIT-LEV 2 EST PT Modifier: Quantity: 1 Physician Procedures : CPT4 Code Description Modifier [id number] - WC PHYS LEVEL 3 - EST PT ICD-10 Diagnosis Description I87.311 Chronic venous hypertension (idiopathic) with ulcer of right lower extremity L97.818 Non-pressure chronic ulcer of other part of right lower  leg with other specified severity 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

## 2022-12-07 ENCOUNTER — Ambulatory Visit (INDEPENDENT_AMBULATORY_CARE_PROVIDER_SITE_OTHER): Payer: BC Managed Care – PPO | Admitting: Family Medicine

## 2022-12-07 ENCOUNTER — Encounter: Payer: Self-pay | Admitting: Family Medicine

## 2022-12-07 VITALS — BP 126/82 | HR 58 | Temp 96.2°F | Wt 174.0 lb

## 2022-12-07 DIAGNOSIS — I129 Hypertensive chronic kidney disease with stage 1 through stage 4 chronic kidney disease, or unspecified chronic kidney disease: Secondary | ICD-10-CM

## 2022-12-07 DIAGNOSIS — N183 Chronic kidney disease, stage 3 unspecified: Secondary | ICD-10-CM | POA: Diagnosis not present

## 2022-12-07 DIAGNOSIS — R7303 Prediabetes: Secondary | ICD-10-CM

## 2022-12-07 DIAGNOSIS — L97912 Non-pressure chronic ulcer of unspecified part of right lower leg with fat layer exposed: Secondary | ICD-10-CM

## 2022-12-07 LAB — POCT GLYCOSYLATED HEMOGLOBIN (HGB A1C): Hemoglobin A1C: 6.2 % — AB (ref 4.0–5.6)

## 2022-12-07 NOTE — Progress Notes (Unsigned)
Subjective:    Patient ID: [name], male    DOB: 04/27/1955, 67 y.o.   MRN: [id number]  [name] is a 67 y.o. male presenting on 12/07/2022 for No chief complaint on file.   HPI  06/15/22 - EGFR from Dr Cherylann Ratel - 44 07/01/22 - Now last lab showed Cr 1.6, and EGFR 47   A1c 6.4, prior range 5.8 to 6.1     Here for Annual Physical and Lab Review.   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 Right has nearly no function.   Question if kidney stone recently, he increased water, could have had kidney stone and passed.   Last lab Creatinine 1.65 (05/2021), prior range 1.4 to 1.6 over past 5+ years.   He has history of CKD in past. No chronic use NSAID. He has had recurrent kidney stones in past, some significant ones   His ACEi was swapped to Losartan. BP improved Current Meds - Losartan 50mg  daily, Amlodipine 5mg  daily Reports good compliance, took meds today. Tolerating well, w/o complaints. Denies CP, dyspnea, HA, edema, dizziness / lightheadedness    Pre-Diabetes Previous A1c 6.4 Due today A1c CBGs: not checking Meds: not on med Currently on ACEi Lifestyle: Weight up 10 lbs approx in 4 months Diet improved. Improved water intake. - Exercise (limited - less exercise but frequent walking) Denies hypoglycemia, polyuria, visual changes, numbness or tingling.  ***Reduced pasta, pizza, hotdogs. Healthier breakfast now mostly fruit.   GERD Chronic GERD heartburn, controlled on PPI Request generic order   History of Migraine HA No more migraines, with BP controlled   BPH nocturia Reports frequent nocturia symptoms, without worsening, no other significant urinary symptoms. Taking Tamsulosin  0.4mg  nightly Improved significantly with less nocturia   PMH Nephrolithiasis  ***Wound RLE UC 8/7 and wound care center, fnished oral antibiotic, now on topical antibiotic for up to 14 weeks Biopsy done Bandage change daily Elevation of leg to prevent edema / RICE therapy Goal to wear compression socks     Health Maintenance:   Due for Flu Shot, declines today despite counseling on benefits   Declines COVID19 vaccine for future   PSA 1.9 (05/2020) negative.  Health Maintenance: ***     07/07/2022    3:39 PM 07/01/2021    3:53 PM 03/14/2021    3:24 PM  Depression screen PHQ 2/9  Decreased Interest 0 0 0  Down, Depressed, Hopeless 0 0 0  PHQ - 2 Score 0 0 0  Altered sleeping 0 0 0  Tired, decreased energy 0 0 0  Change in appetite 0 0 0  Feeling bad or failure about yourself  0 0 0  Trouble concentrating 0 0 0  Moving slowly or fidgety/restless 0 0 0  Suicidal thoughts 0 0 0  PHQ-9 Score 0 0 0  Difficult doing work/chores Not difficult at all Not difficult at all Not difficult at all    Social History   Tobacco Use   Smoking status: Never   Smokeless tobacco: Never  Vaping Use   Vaping status: Never Used  Substance Use Topics   Alcohol use: Yes    Alcohol/week: 2.0 standard drinks of alcohol    Types: 2 Glasses of wine per week  Comment: once a month   Drug use: No    Review of Systems Per HPI unless specifically indicated above     Objective:    BP 126/82 (BP Location: Right Arm, Patient Position: Sitting, Cuff Size: Normal)   Pulse (!) 58   Temp (!) 96.2 F (35.7 C) (Temporal)   Wt 187 lb (84.8 kg)   SpO2 96%   BMI 28.43 kg/m   Wt Readings from Last 3 Encounters:  12/07/22 187 lb (84.8 kg)  07/07/22 183 lb (83 kg)  07/01/21 193 lb 9.6 oz (87.8 kg)    Physical Exam    Results for orders placed or performed in visit on 12/07/22  POCT glycosylated hemoglobin (Hb A1C)  Result Value Ref Range   Hemoglobin A1C 6.2 (A) 4.0 - 5.6 %       Assessment & Plan:   Problem List Items Addressed This Visit     Benign hypertension with CKD (chronic kidney disease) stage III (HCC)   Pre-diabetes - Primary   Relevant Orders   POCT glycosylated hemoglobin (Hb A1C) (Completed)   Other Visit Diagnoses     Non-healing ulcer of lower leg, right, with fat layer exposed (HCC)           No orders of the defined types were placed in this encounter.     Follow up plan: Return in about 7 months (around 07/07/2023) for 7 month fasting lab only then 1 week later Annual Physical - 1st am apt.  Future labs ordered for ***   [name] Pilar, DO [facility] 12/07/2022, 3:56 PM

## 2022-12-07 NOTE — Patient Instructions (Addendum)
Thank you for coming to the office today.  Recent Labs    07/01/22 0801 12/07/22 1556  HGBA1C 6.4* 6.2*   DUE for FASTING BLOOD WORK (no food or drink after midnight before the lab appointment, only water or coffee without cream/sugar on the morning of)  SCHEDULE "Lab Only" visit in the morning at the clinic for lab draw in 7 MONTHS   - Make sure Lab Only appointment is at about 1 week before your next appointment, so that results will be available  For Lab Results, once available within 2-3 days of blood draw, you can can log in to MyChart online to view your results and a brief explanation. Also, we can discuss results at next follow-up visit.   Please schedule a Follow-up Appointment to: Return in about 7 months (around 07/07/2023) for 7 month fasting lab only then 1 week later Annual Physical - 1st am apt.  If you have any other questions or concerns, please feel free to call the office or send a message through MyChart. You may also schedule an earlier appointment if necessary.  Additionally, you may be receiving a survey about your experience at our office within a few days to 1 week by e-mail or mail. We value your feedback.  Saralyn Pilar, DO [facility], [place]

## 2022-12-08 ENCOUNTER — Other Ambulatory Visit: Payer: Self-pay | Admitting: Family Medicine

## 2022-12-08 ENCOUNTER — Encounter: Payer: Self-pay | Admitting: Family Medicine

## 2022-12-08 DIAGNOSIS — E785 Hyperlipidemia, unspecified: Secondary | ICD-10-CM

## 2022-12-08 DIAGNOSIS — R7303 Prediabetes: Secondary | ICD-10-CM

## 2022-12-08 DIAGNOSIS — N183 Chronic kidney disease, stage 3 unspecified: Secondary | ICD-10-CM

## 2022-12-08 DIAGNOSIS — Z0000 Encounter for general adult medical examination without abnormal findings: Secondary | ICD-10-CM

## 2022-12-08 DIAGNOSIS — N401 Enlarged prostate with lower urinary tract symptoms: Secondary | ICD-10-CM

## 2022-12-08 NOTE — Assessment & Plan Note (Signed)
Improved A1c to 6.2 Still mild Elevated Goal to limit excess carb starches No medication needed for the blood sugar.

## 2022-12-08 NOTE — Assessment & Plan Note (Signed)
Well-controlled HTN - Home BP readings reviewed Complication with CKD III, R Renal agenesis history hydronephrosis Off Metoprolol    Plan: 1. Continue current Amlodipine 5mg  daily, Losartan 50mg  daily 2. Encourage improved lifestyle - low sodium diet, regular exercise 3. Keep monitor BP outside office, bring readings to next visit, if persistently >140/90 or new symptoms notify office sooner

## 2022-12-09 ENCOUNTER — Encounter (HOSPITAL_BASED_OUTPATIENT_CLINIC_OR_DEPARTMENT_OTHER): Payer: BC Managed Care – PPO | Admitting: Internal Medicine

## 2022-12-09 DIAGNOSIS — H908 Mixed conductive and sensorineural hearing loss, unspecified: Secondary | ICD-10-CM | POA: Diagnosis not present

## 2022-12-09 DIAGNOSIS — I10 Essential (primary) hypertension: Secondary | ICD-10-CM | POA: Diagnosis not present

## 2022-12-09 DIAGNOSIS — L97818 Non-pressure chronic ulcer of other part of right lower leg with other specified severity: Secondary | ICD-10-CM

## 2022-12-09 DIAGNOSIS — I87311 Chronic venous hypertension (idiopathic) with ulcer of right lower extremity: Secondary | ICD-10-CM | POA: Diagnosis not present

## 2022-12-09 DIAGNOSIS — J45909 Unspecified asthma, uncomplicated: Secondary | ICD-10-CM | POA: Diagnosis not present

## 2022-12-11 NOTE — Progress Notes (Signed)
REO, MAYHUE ([id number]) [id number] Page 1 of 7 Visit Report for 12/09/2022 Chief Complaint Document Details Patient Name: Date of Service: [name], [name]. 12/09/2022 3:00 PM Medical Record Number: [id number] Patient Account Number: [id number] Date of Birth/Sex: Treating RN: [birth year] (67 y.o. Judie Petit) Yevonne Pax Primary Care Provider: Saralyn Pilar Other Clinician: Referring Provider: Treating Provider/Extender: Lise Auer in Treatment: 3 Information Obtained from: Patient Chief Complaint 11/18/2022; right lower extremity wounds Electronic Signature(s) Signed: 12/09/2022 4:47:58 PM By: Geralyn Corwin DO Entered By: Geralyn Corwin on 12/09/2022 15:55:48 -------------------------------------------------------------------------------- HPI Details Patient Name: Date of Service: [name], [name] 12/09/2022 3:00 PM Medical Record Number: [id number] Patient Account Number: [id number] Date of Birth/Sex: Treating RN: 1955-06-13 (67 y.o. [name] Primary Care Provider: Saralyn Pilar Other Clinician: Referring Provider: Treating Provider/Extender: Lise Auer in Treatment: 3 History of Present Illness HPI Description: 11/18/2022 [name] is a 67 year old male with a past medical history of nerve conduction deafness that presents to the clinic for a 7 month history of nonhealing ulcers to the right lower extremity. He states these happened spontaneously and have progressively gotten worse. The more proximal wound has been present for 7 months and the most recent wound on the top of the foot/distal leg has been present for the past month. He has 5 wounds total. He visited the ED on 11/04/2022 for this issue. He was given cephalexin for which he completed. He has been using Several ointments at different times to address this issue. These ointments include Silvadene,  mupirocin and clobetasol.. At times he keeps the wounds open to air. He also states he will go in the pool with the wounds exposed. For work he is on his feet for long periods of time. He does not wear compression stockings. ABIs in office were 0.8. He currently denies signs of infection. 8/28; patient presents for follow-up. He has been using Vashe wet-to-dry dressings to the wound beds. Patient's wound culture showed Pseudomonas aeruginosa sensitive to ciprofloxacin and this was sent into the pharmacy. Also recommended Keystone antibiotic ointment And this has already been ordered. He has not received this yet. He had a skin biopsy done as well that showed fibrosis inflammation. This was nonspecific. Patient declined debridement due to chronic pain. 9/4; patient presents for follow-up. He has been using Vashe wet-to-dry dressings to the wound beds. He obtains the [facility] antibiotic ointment tomorrow. He has 1 day left of his oral antibiotics. There has been improvement in size since last clinic visit to the wound beds. He currently denies systemic signs of infection. [name] ([id number]) [id number] Page 2 of 7 9/11; patient presents for follow-up. He has been using Keystone antibiotic ointment to the wound beds. He has tolerated this treatment well. Wound measurements are stable however periwound appears less irritated. Electronic Signature(s) Signed: 12/09/2022 4:47:58 PM By: Geralyn Corwin DO Entered By: Geralyn Corwin on 12/09/2022 15:57:29 -------------------------------------------------------------------------------- Physical Exam Details Patient Name: Date of Service: [name], [name] 12/09/2022 3:00 PM Medical Record Number: [id number] Patient Account Number: [id number] Date of Birth/Sex: Treating RN: 1955/12/05 (67 y.o. [name] Primary Care Provider: Saralyn Pilar Other Clinician: Referring Provider: Treating Provider/Extender:  Lise Auer in Treatment: 3 Constitutional . Cardiovascular . Psychiatric . Notes Right lower extremity: T the distal aspect there are 5 open wounds with nonviable tissue throughout With scant granulation buds. 2+ pitting edema to the knee. o Venous stasis dermatitis with varicosities  REO, MAYHUE ([id number]) [id number] Page 1 of 7 Visit Report for 12/09/2022 Chief Complaint Document Details Patient Name: Date of Service: [name], [name]. 12/09/2022 3:00 PM Medical Record Number: [id number] Patient Account Number: [id number] Date of Birth/Sex: Treating RN: [birth year] (67 y.o. Judie Petit) Yevonne Pax Primary Care Provider: Saralyn Pilar Other Clinician: Referring Provider: Treating Provider/Extender: Lise Auer in Treatment: 3 Information Obtained from: Patient Chief Complaint 11/18/2022; right lower extremity wounds Electronic Signature(s) Signed: 12/09/2022 4:47:58 PM By: Geralyn Corwin DO Entered By: Geralyn Corwin on 12/09/2022 15:55:48 -------------------------------------------------------------------------------- HPI Details Patient Name: Date of Service: [name], [name] 12/09/2022 3:00 PM Medical Record Number: [id number] Patient Account Number: [id number] Date of Birth/Sex: Treating RN: 1955-06-13 (67 y.o. [name] Primary Care Provider: Saralyn Pilar Other Clinician: Referring Provider: Treating Provider/Extender: Lise Auer in Treatment: 3 History of Present Illness HPI Description: 11/18/2022 [name] is a 67 year old male with a past medical history of nerve conduction deafness that presents to the clinic for a 7 month history of nonhealing ulcers to the right lower extremity. He states these happened spontaneously and have progressively gotten worse. The more proximal wound has been present for 7 months and the most recent wound on the top of the foot/distal leg has been present for the past month. He has 5 wounds total. He visited the ED on 11/04/2022 for this issue. He was given cephalexin for which he completed. He has been using Several ointments at different times to address this issue. These ointments include Silvadene,  mupirocin and clobetasol.. At times he keeps the wounds open to air. He also states he will go in the pool with the wounds exposed. For work he is on his feet for long periods of time. He does not wear compression stockings. ABIs in office were 0.8. He currently denies signs of infection. 8/28; patient presents for follow-up. He has been using Vashe wet-to-dry dressings to the wound beds. Patient's wound culture showed Pseudomonas aeruginosa sensitive to ciprofloxacin and this was sent into the pharmacy. Also recommended Keystone antibiotic ointment And this has already been ordered. He has not received this yet. He had a skin biopsy done as well that showed fibrosis inflammation. This was nonspecific. Patient declined debridement due to chronic pain. 9/4; patient presents for follow-up. He has been using Vashe wet-to-dry dressings to the wound beds. He obtains the [facility] antibiotic ointment tomorrow. He has 1 day left of his oral antibiotics. There has been improvement in size since last clinic visit to the wound beds. He currently denies systemic signs of infection. [name] ([id number]) [id number] Page 2 of 7 9/11; patient presents for follow-up. He has been using Keystone antibiotic ointment to the wound beds. He has tolerated this treatment well. Wound measurements are stable however periwound appears less irritated. Electronic Signature(s) Signed: 12/09/2022 4:47:58 PM By: Geralyn Corwin DO Entered By: Geralyn Corwin on 12/09/2022 15:57:29 -------------------------------------------------------------------------------- Physical Exam Details Patient Name: Date of Service: [name], [name] 12/09/2022 3:00 PM Medical Record Number: [id number] Patient Account Number: [id number] Date of Birth/Sex: Treating RN: 1955/12/05 (67 y.o. [name] Primary Care Provider: Saralyn Pilar Other Clinician: Referring Provider: Treating Provider/Extender:  Lise Auer in Treatment: 3 Constitutional . Cardiovascular . Psychiatric . Notes Right lower extremity: T the distal aspect there are 5 open wounds with nonviable tissue throughout With scant granulation buds. 2+ pitting edema to the knee. o Venous stasis dermatitis with varicosities  REO, MAYHUE ([id number]) [id number] Page 1 of 7 Visit Report for 12/09/2022 Chief Complaint Document Details Patient Name: Date of Service: [name], [name]. 12/09/2022 3:00 PM Medical Record Number: [id number] Patient Account Number: [id number] Date of Birth/Sex: Treating RN: [birth year] (67 y.o. Judie Petit) Yevonne Pax Primary Care Provider: Saralyn Pilar Other Clinician: Referring Provider: Treating Provider/Extender: Lise Auer in Treatment: 3 Information Obtained from: Patient Chief Complaint 11/18/2022; right lower extremity wounds Electronic Signature(s) Signed: 12/09/2022 4:47:58 PM By: Geralyn Corwin DO Entered By: Geralyn Corwin on 12/09/2022 15:55:48 -------------------------------------------------------------------------------- HPI Details Patient Name: Date of Service: [name], [name] 12/09/2022 3:00 PM Medical Record Number: [id number] Patient Account Number: [id number] Date of Birth/Sex: Treating RN: 1955-06-13 (67 y.o. [name] Primary Care Provider: Saralyn Pilar Other Clinician: Referring Provider: Treating Provider/Extender: Lise Auer in Treatment: 3 History of Present Illness HPI Description: 11/18/2022 [name] is a 67 year old male with a past medical history of nerve conduction deafness that presents to the clinic for a 7 month history of nonhealing ulcers to the right lower extremity. He states these happened spontaneously and have progressively gotten worse. The more proximal wound has been present for 7 months and the most recent wound on the top of the foot/distal leg has been present for the past month. He has 5 wounds total. He visited the ED on 11/04/2022 for this issue. He was given cephalexin for which he completed. He has been using Several ointments at different times to address this issue. These ointments include Silvadene,  mupirocin and clobetasol.. At times he keeps the wounds open to air. He also states he will go in the pool with the wounds exposed. For work he is on his feet for long periods of time. He does not wear compression stockings. ABIs in office were 0.8. He currently denies signs of infection. 8/28; patient presents for follow-up. He has been using Vashe wet-to-dry dressings to the wound beds. Patient's wound culture showed Pseudomonas aeruginosa sensitive to ciprofloxacin and this was sent into the pharmacy. Also recommended Keystone antibiotic ointment And this has already been ordered. He has not received this yet. He had a skin biopsy done as well that showed fibrosis inflammation. This was nonspecific. Patient declined debridement due to chronic pain. 9/4; patient presents for follow-up. He has been using Vashe wet-to-dry dressings to the wound beds. He obtains the [facility] antibiotic ointment tomorrow. He has 1 day left of his oral antibiotics. There has been improvement in size since last clinic visit to the wound beds. He currently denies systemic signs of infection. [name] ([id number]) [id number] Page 2 of 7 9/11; patient presents for follow-up. He has been using Keystone antibiotic ointment to the wound beds. He has tolerated this treatment well. Wound measurements are stable however periwound appears less irritated. Electronic Signature(s) Signed: 12/09/2022 4:47:58 PM By: Geralyn Corwin DO Entered By: Geralyn Corwin on 12/09/2022 15:57:29 -------------------------------------------------------------------------------- Physical Exam Details Patient Name: Date of Service: [name], [name] 12/09/2022 3:00 PM Medical Record Number: [id number] Patient Account Number: [id number] Date of Birth/Sex: Treating RN: 1955/12/05 (67 y.o. [name] Primary Care Provider: Saralyn Pilar Other Clinician: Referring Provider: Treating Provider/Extender:  Lise Auer in Treatment: 3 Constitutional . Cardiovascular . Psychiatric . Notes Right lower extremity: T the distal aspect there are 5 open wounds with nonviable tissue throughout With scant granulation buds. 2+ pitting edema to the knee. o Venous stasis dermatitis with varicosities  leg(s) parallel to the floor when sitting. WOUND #1: - Lower Leg Wound Laterality: Right, Lateral Cleanser: Byram Ancillary Kit - 15 Day Supply (Generic) 1 x Per Day/30 Days Discharge Instructions: Use supplies as instructed; Kit contains: (15) Saline Bullets; (15) 3x3 Gauze; 15 pr Gloves Cleanser: Vashe 5.8 (oz) 1 x Per Day/30 Days Discharge Instructions: Use vashe 5.8 (oz) as directed Prim Dressing: Gauze (Generic) 1 x Per Day/30 Days ary Discharge Instructions: moistened with vashe Solution until keystone arrives then dc vashe and replace with keystone w/d Secondary Dressing: ABD Pad 5x9 (in/in) (Generic) 1 x Per Day/30 Days Discharge Instructions: Cover with ABD pad Secured With: Tubigrip Size D, 3x10 (in/yd) 1 x Per Day/30 Days 1. Keystone antibiotic ointment 2. Tubigrip 3. Follow-up in 1 week Electronic Signature(s) Signed: 12/09/2022 4:47:58 PM By: Geralyn Corwin DO Entered By: Geralyn Corwin on 12/09/2022 15:59:16 -------------------------------------------------------------------------------- ROS/PFSH Details Patient Name: Date of Service: [name], [name]  12/09/2022 3:00 PM Medical Record Number: [id number] Patient Account Number: [id number] Date of Birth/Sex: Treating RN: 01-Mar-1956 (67 y.o. [name] Primary Care Provider: Saralyn Pilar Other Clinician: Referring Provider: Treating Provider/Extender: Carole, Granillo, [name] ([id number]) [id number] Page 6 of 7 Weeks in Treatment: 3 Respiratory Medical History: Positive for: Asthma Cardiovascular Medical History: Positive for: Hypertension Immunizations Pneumococcal Vaccine: Received Pneumococcal Vaccination: No Implantable Devices No devices added Family and Social History Never smoker; Marital Status - Married; Alcohol Use: Never; Drug Use: No History; Caffeine Use: Daily Electronic Signature(s) Signed: 12/09/2022 4:47:58 PM By: Geralyn Corwin DO Signed: 12/11/2022 1:15:19 PM By: Yevonne Pax RN Entered By: Geralyn Corwin on 12/09/2022 16:00:04 -------------------------------------------------------------------------------- SuperBill Details Patient Name: Date of Service: [name], [name] 12/09/2022 Medical Record Number: [id number] Patient Account Number: [id number] Date of Birth/Sex: Treating RN: 1956-03-13 (67 y.o. 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, unspecified Facility Procedures : CPT4 Code: [id number] Description: 99213 - WOUND CARE VISIT-LEV 3 EST PT Modifier: Quantity: 1 Physician Procedures : CPT4 Code Description Modifier [id number] - WC PHYS LEVEL 3 -  EST PT ICD-10 Diagnosis Description I87.311 Chronic venous 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

## 2022-12-11 NOTE — Progress Notes (Signed)
Treating RN: [birth year] (67 y.o. [name] Primary Care Physician: Saralyn Pilar Other Clinician: Referring Physician: Treating Physician/Extender: Lise Auer in Treatment: 3 Education Assessment Education Provided To: Patient Education Topics Provided Wound/Skin Impairment: Handouts: Caring for Your Ulcer Methods: Explain/Verbal Responses: State content correctly Electronic Signature(s) Signed: 12/11/2022 1:15:19 PM By: Yevonne Pax RN Entered By: Yevonne Pax on  12/09/2022 16:29:50 Lavin, [name] ([id number]) [id number] Page 8 of 9 -------------------------------------------------------------------------------- Wound Assessment Details Patient Name: Date of Service: [name], Badr M. 12/09/2022 3:00 PM Medical Record Number: [id number] Patient Account Number: [id number] Date of Birth/Sex: Treating RN: February 04, 1956 (67 y.o. Judie Petit) Yevonne Pax Primary Care Vihana Kydd: Saralyn Pilar Other Clinician: Referring Basem Yannuzzi: Treating Senai Kingsley/Extender: Lise Auer in Treatment: 3 Wound Status Wound Number: 1 Primary Etiology: Atypical Wound Location: Right, Lateral Lower Leg Wound Status: Open Wounding Event: Gradually Appeared Comorbid History: Asthma, Hypertension Date Acquired: 08/29/2022 Weeks Of Treatment: 3 Clustered Wound: Yes Photos Wound Measurements Length: (cm) 11 Width: (cm) 11 Depth: (cm) 0.4 Area: (cm) 95.033 Volume: (cm) 38.013 % Reduction in Area: 0% % Reduction in Volume: 0% Epithelialization: None Tunneling: No Undermining: No Wound Description Classification: Full Thickness Without Exposed Suppor Exudate Amount: Medium Exudate Type: Serosanguineous Exudate Color: red, brown t Structures Foul Odor After Cleansing: No Slough/Fibrino Yes Wound Bed Granulation Amount: None Present (0%) Exposed Structure Necrotic Amount: Large (67-100%) Fascia Exposed: No Necrotic Quality: Eschar Fat Layer (Subcutaneous Tissue) Exposed: Yes Tendon Exposed: No Muscle Exposed: No Joint Exposed: No Bone Exposed: No Treatment Notes Wound #1 (Lower Leg) Wound Laterality: Right, Lateral Cleanser Byram Ancillary Kit - 15 Day Supply Discharge Instruction: Use supplies as instructed; Kit contains: (15) Saline Bullets; (15) 3x3 Gauze; 15 pr Gloves Vashe 5.8 (oz) Mcnatt, [name] ([id number]) [id number] Page 9 of 9 Discharge Instruction: Use vashe 5.8 (oz) as  directed Peri-Wound Care Topical Primary Dressing Gauze Discharge Instruction: moistened with vashe Solution until keystone arrives then dc vashe and replace with keystone w/d Secondary Dressing ABD Pad 5x9 (in/in) Discharge Instruction: Cover with ABD pad Secured With Tubigrip Size D, 3x10 (in/yd) Compression Wrap Compression Stockings Add-Ons Electronic Signature(s) Signed: 12/11/2022 1:15:19 PM By: Yevonne Pax RN Entered By: Yevonne Pax on 12/09/2022 15:14:08 -------------------------------------------------------------------------------- Vitals Details Patient Name: Date of Service: [name], Sagan M. 12/09/2022 3:00 PM Medical Record Number: [id number] Patient Account Number: [id number] Date of Birth/Sex: Treating RN: 1955-06-10 (67 y.o. Judie Petit) Yevonne Pax Primary Care Kariya Lavergne: Saralyn Pilar Other Clinician: Referring Labrina Lines: Treating Natale Thoma/Extender: Lise Auer in Treatment: 3 Vital Signs Time Taken: 15:00 Temperature (F): 97.8 Height (in): 67 Pulse (bpm): 55 Weight (lbs): 174 Respiratory Rate (breaths/min): 16 Body Mass Index (BMI): 27.2 Blood Pressure (mmHg): 122/72 Reference Range: 80 - 120 mg / dl Electronic Signature(s) Signed: 12/11/2022 1:15:19 PM By: Yevonne Pax RN Entered By: Yevonne Pax on 12/09/2022 15:02:54  Index(BMI): 27.2 Temperature(F): 97.8 Respiratory Rate(breaths/min): 16 Schneider, Mena M ([id number]) [id number] Page 5 of 9 [1:Photos:] [N/A:N/A] Right, Lateral Lower Leg N/A N/A Wound Location: Gradually Appeared N/A N/A Wounding Event: Atypical N/A N/A Primary Etiology: Asthma, Hypertension N/A N/A Comorbid History: 08/29/2022 N/A N/A Date Acquired: 3 N/A N/A Weeks of Treatment: Open N/A N/A Wound Status: No N/A N/A Wound Recurrence: Yes N/A N/A Clustered Wound: 11x11x0.4 N/A N/A Measurements L x W x D (cm) 95.033 N/A N/A A (cm) : rea 38.013 N/A N/A Volume (cm) : 0.00% N/A N/A % Reduction in A rea: 0.00% N/A N/A % Reduction in Volume: Full Thickness Without Exposed N/A N/A Classification: Support Structures Medium N/A N/A Exudate A mount: Serosanguineous N/A N/A Exudate Type: red, brown N/A N/A Exudate Color: None Present (0%) N/A N/A Granulation Amount: Large (67-100%) N/A N/A Necrotic Amount: Eschar N/A N/A Necrotic Tissue: Fat Layer (Subcutaneous Tissue): Yes N/A N/A Exposed Structures: Fascia: No Tendon: No Muscle: No Joint: No Bone: No None N/A N/A Epithelialization: Treatment Notes Electronic Signature(s) Signed: 12/09/2022 4:47:58 PM By: Geralyn Corwin DO Entered By: Geralyn Corwin on 12/09/2022  16:00:11 -------------------------------------------------------------------------------- Multi-Disciplinary Care Plan Details Patient Name: Date of Service: [name], Ervan M. 12/09/2022 3:00 PM Medical Record Number: [id number] Patient Account Number: [id number] Date of Birth/Sex: Treating RN: January 29, 1956 (67 y.o. [name] Primary Care Preethi Scantlebury: Saralyn Pilar Other Clinician: Referring Gertie Broerman: Treating Nashea Chumney/Extender: Lise Auer in Treatment: 3 Active Inactive Necrotic Tissue Masella, [name] ([id number]) [id number] Page 6 of 9 Nursing Diagnoses: Knowledge deficit related to management of necrotic/devitalized tissue Goals: Patient/caregiver will verbalize understanding of reason and process for debridement of necrotic tissue Date Initiated: 11/18/2022 Target Resolution Date: 12/19/2022 Goal Status: Active Interventions: Assess patient pain level pre-, during and post procedure and prior to discharge Notes: Wound/Skin Impairment Nursing Diagnoses: Knowledge deficit related to ulceration/compromised skin integrity Goals: Patient/caregiver will verbalize understanding of skin care regimen Date Initiated: 11/18/2022 Target Resolution Date: 12/19/2022 Goal Status: Active Ulcer/skin breakdown will have a volume reduction of 30% by week 4 Date Initiated: 11/18/2022 Target Resolution Date: 12/19/2022 Goal Status: Active Ulcer/skin breakdown will have a volume reduction of 50% by week 8 Date Initiated: 11/18/2022 Target Resolution Date: 01/18/2023 Goal Status: Active Ulcer/skin breakdown will have a volume reduction of 80% by week 12 Date Initiated: 11/18/2022 Target Resolution Date: 02/18/2023 Goal Status: Active Ulcer/skin breakdown will heal within 14 weeks Date Initiated: 11/18/2022 Target Resolution Date: 03/20/2023 Goal Status: Active Interventions: Assess patient/caregiver ability to obtain necessary  supplies Assess patient/caregiver ability to perform ulcer/skin care regimen upon admission and as needed Assess ulceration(s) every visit Notes: Electronic Signature(s) Signed: 12/09/2022 3:59:43 PM By: Yevonne Pax RN Entered By: Yevonne Pax on 12/09/2022 15:59:43 -------------------------------------------------------------------------------- Pain Assessment Details Patient Name: Date of Service: [name], Kendric M. 12/09/2022 3:00 PM Medical Record Number: [id number] Patient Account Number: [id number] Date of Birth/Sex: Treating RN: [birth year] (67 y.o. [name] Primary Care Johsua Shevlin: Saralyn Pilar Other Clinician: Referring Venissa Nappi: Treating Waleska Buttery/Extender: Lise Auer in Treatment: 3 Active Problems Location of Pain Severity and Description of Pain Patient Has Paino No Site Locations [place] ([id number]) [id number] Page 7 of 9 Pain Management and Medication Current Pain Management: Electronic Signature(s) Signed: 12/11/2022 1:15:19 PM By: Yevonne Pax RN Entered By: Yevonne Pax on 12/09/2022 15:03:12 -------------------------------------------------------------------------------- Patient/Caregiver Education Details Patient Name: Date of Service: [name] 9/11/2024andnbsp3:00 PM Medical Record Number: [id number] Patient Account Number: [id number] Date of Birth/Gender:  STEVON, BROWNSON ([id number]) [id number] Page 1 of 9 Visit Report for 12/09/2022 Arrival Information Details Patient Name: Date of Service: [name], Ules M. 12/09/2022 3:00 PM Medical Record Number: [id number] Patient Account Number: [id number] Date of Birth/Sex: Treating RN: 08/01/1955 (67 y.o. Judie Petit) Yevonne Pax Primary Care Biff Rutigliano: Saralyn Pilar Other Clinician: Referring Jibri Schriefer: Treating Jmarion Christiano/Extender: Lise Auer in Treatment: 3 Visit Information History Since Last Visit Added or deleted any medications: No Patient Arrived: Ambulatory Any new allergies or adverse reactions: No Arrival Time: 15:02 Had a fall or experienced change in No Accompanied By: wife activities of daily living that may affect Transfer Assistance: None risk of falls: Patient Identification Verified: Yes Signs or symptoms of abuse/neglect since last visito No Secondary Verification Process Completed: Yes Hospitalized since last visit: No Patient Requires Transmission-Based Precautions: No Implantable device outside of the clinic excluding No Patient Has Alerts: No cellular tissue based products placed in the center since last visit: Has Dressing in Place as Prescribed: Yes Has Compression in Place as Prescribed: Yes Pain Present Now: No Electronic Signature(s) Signed: 12/11/2022 1:15:19 PM By: Yevonne Pax RN Entered By: Yevonne Pax on 12/09/2022 15:02:29 -------------------------------------------------------------------------------- Clinic Level of Care Assessment Details Patient Name: Date of Service: STU, MINOTTI. 12/09/2022 3:00 PM Medical Record Number: [id number] Patient Account Number: [id number] Date of Birth/Sex: Treating RN: Nov 29, 1955 (67 y.o. Judie Petit) Yevonne Pax Primary Care Makinna Andy: Saralyn Pilar Other Clinician: Referring Graviela Nodal: Treating Wash Nienhaus/Extender: Lise Auer in  Treatment: 3 Clinic Level of Care Assessment Items TOOL 4 Quantity Score X- 1 0 Use when only an EandM is performed on FOLLOW-UP visit ASSESSMENTS - Nursing Assessment / Reassessment X- 1 10 Reassessment of Co-morbidities (includes updates in patient status) X- 1 5 Reassessment of Adherence to Treatment Plan DATRON, LOPIANO ([id number]) [id number] Page 2 of 9 ASSESSMENTS - Wound and Skin A ssessment / Reassessment X - Simple Wound Assessment / Reassessment - one wound 1 5 []  - 0 Complex Wound Assessment / Reassessment - multiple wounds []  - 0 Dermatologic / Skin Assessment (not related to wound area) ASSESSMENTS - Focused Assessment []  - 0 Circumferential Edema Measurements - multi extremities []  - 0 Nutritional Assessment / Counseling / Intervention []  - 0 Lower Extremity Assessment (monofilament, tuning fork, pulses) []  - 0 Peripheral Arterial Disease Assessment (using hand held doppler) ASSESSMENTS - Ostomy and/or Continence Assessment and Care []  - 0 Incontinence Assessment and Management []  - 0 Ostomy Care Assessment and Management (repouching, etc.) PROCESS - Coordination of Care X - Simple Patient / Family Education for ongoing care 1 15 []  - 0 Complex (extensive) Patient / Family Education for ongoing care []  - 0 Staff obtains Chiropractor, Records, T Results / Process Orders est []  - 0 Staff telephones HHA, Nursing Homes / Clarify orders / etc []  - 0 Routine Transfer to another Facility (non-emergent condition) []  - 0 Routine Hospital Admission (non-emergent condition) []  - 0 New Admissions / Manufacturing engineer / Ordering NPWT Apligraf, etc. , []  - 0 Emergency Hospital Admission (emergent condition) X- 1 10 Simple Discharge Coordination []  - 0 Complex (extensive) Discharge Coordination PROCESS - Special Needs []  - 0 Pediatric / Minor Patient Management []  - 0 Isolation Patient Management []  - 0 Hearing / Language / Visual  special needs []  - 0 Assessment of Community assistance (transportation, D/C planning, etc.) []  - 0 Additional assistance / Altered mentation []  - 0 Support Surface(s) Assessment (bed, cushion, seat, etc.) INTERVENTIONS - Wound  STEVON, BROWNSON ([id number]) [id number] Page 1 of 9 Visit Report for 12/09/2022 Arrival Information Details Patient Name: Date of Service: [name], Ules M. 12/09/2022 3:00 PM Medical Record Number: [id number] Patient Account Number: [id number] Date of Birth/Sex: Treating RN: 08/01/1955 (67 y.o. Judie Petit) Yevonne Pax Primary Care Biff Rutigliano: Saralyn Pilar Other Clinician: Referring Jibri Schriefer: Treating Jmarion Christiano/Extender: Lise Auer in Treatment: 3 Visit Information History Since Last Visit Added or deleted any medications: No Patient Arrived: Ambulatory Any new allergies or adverse reactions: No Arrival Time: 15:02 Had a fall or experienced change in No Accompanied By: wife activities of daily living that may affect Transfer Assistance: None risk of falls: Patient Identification Verified: Yes Signs or symptoms of abuse/neglect since last visito No Secondary Verification Process Completed: Yes Hospitalized since last visit: No Patient Requires Transmission-Based Precautions: No Implantable device outside of the clinic excluding No Patient Has Alerts: No cellular tissue based products placed in the center since last visit: Has Dressing in Place as Prescribed: Yes Has Compression in Place as Prescribed: Yes Pain Present Now: No Electronic Signature(s) Signed: 12/11/2022 1:15:19 PM By: Yevonne Pax RN Entered By: Yevonne Pax on 12/09/2022 15:02:29 -------------------------------------------------------------------------------- Clinic Level of Care Assessment Details Patient Name: Date of Service: STU, MINOTTI. 12/09/2022 3:00 PM Medical Record Number: [id number] Patient Account Number: [id number] Date of Birth/Sex: Treating RN: Nov 29, 1955 (67 y.o. Judie Petit) Yevonne Pax Primary Care Makinna Andy: Saralyn Pilar Other Clinician: Referring Graviela Nodal: Treating Wash Nienhaus/Extender: Lise Auer in  Treatment: 3 Clinic Level of Care Assessment Items TOOL 4 Quantity Score X- 1 0 Use when only an EandM is performed on FOLLOW-UP visit ASSESSMENTS - Nursing Assessment / Reassessment X- 1 10 Reassessment of Co-morbidities (includes updates in patient status) X- 1 5 Reassessment of Adherence to Treatment Plan DATRON, LOPIANO ([id number]) [id number] Page 2 of 9 ASSESSMENTS - Wound and Skin A ssessment / Reassessment X - Simple Wound Assessment / Reassessment - one wound 1 5 []  - 0 Complex Wound Assessment / Reassessment - multiple wounds []  - 0 Dermatologic / Skin Assessment (not related to wound area) ASSESSMENTS - Focused Assessment []  - 0 Circumferential Edema Measurements - multi extremities []  - 0 Nutritional Assessment / Counseling / Intervention []  - 0 Lower Extremity Assessment (monofilament, tuning fork, pulses) []  - 0 Peripheral Arterial Disease Assessment (using hand held doppler) ASSESSMENTS - Ostomy and/or Continence Assessment and Care []  - 0 Incontinence Assessment and Management []  - 0 Ostomy Care Assessment and Management (repouching, etc.) PROCESS - Coordination of Care X - Simple Patient / Family Education for ongoing care 1 15 []  - 0 Complex (extensive) Patient / Family Education for ongoing care []  - 0 Staff obtains Chiropractor, Records, T Results / Process Orders est []  - 0 Staff telephones HHA, Nursing Homes / Clarify orders / etc []  - 0 Routine Transfer to another Facility (non-emergent condition) []  - 0 Routine Hospital Admission (non-emergent condition) []  - 0 New Admissions / Manufacturing engineer / Ordering NPWT Apligraf, etc. , []  - 0 Emergency Hospital Admission (emergent condition) X- 1 10 Simple Discharge Coordination []  - 0 Complex (extensive) Discharge Coordination PROCESS - Special Needs []  - 0 Pediatric / Minor Patient Management []  - 0 Isolation Patient Management []  - 0 Hearing / Language / Visual  special needs []  - 0 Assessment of Community assistance (transportation, D/C planning, etc.) []  - 0 Additional assistance / Altered mentation []  - 0 Support Surface(s) Assessment (bed, cushion, seat, etc.) INTERVENTIONS - Wound

## 2022-12-16 ENCOUNTER — Encounter: Payer: BC Managed Care – PPO | Admitting: Internal Medicine

## 2022-12-16 DIAGNOSIS — I10 Essential (primary) hypertension: Secondary | ICD-10-CM | POA: Diagnosis not present

## 2022-12-16 DIAGNOSIS — I87311 Chronic venous hypertension (idiopathic) with ulcer of right lower extremity: Secondary | ICD-10-CM | POA: Diagnosis not present

## 2022-12-16 DIAGNOSIS — H908 Mixed conductive and sensorineural hearing loss, unspecified: Secondary | ICD-10-CM | POA: Diagnosis not present

## 2022-12-16 DIAGNOSIS — S81801A Unspecified open wound, right lower leg, initial encounter: Secondary | ICD-10-CM | POA: Diagnosis not present

## 2022-12-16 DIAGNOSIS — J45909 Unspecified asthma, uncomplicated: Secondary | ICD-10-CM | POA: Diagnosis not present

## 2022-12-16 DIAGNOSIS — L97818 Non-pressure chronic ulcer of other part of right lower leg with other specified severity: Secondary | ICD-10-CM | POA: Diagnosis not present

## 2022-12-17 ENCOUNTER — Encounter: Payer: Self-pay | Admitting: Family Medicine

## 2022-12-17 DIAGNOSIS — L97912 Non-pressure chronic ulcer of unspecified part of right lower leg with fat layer exposed: Secondary | ICD-10-CM

## 2022-12-17 DIAGNOSIS — R52 Pain, unspecified: Secondary | ICD-10-CM

## 2022-12-17 MED ORDER — TRAMADOL HCL 50 MG PO TABS
50.0000 mg | ORAL_TABLET | Freq: Four times a day (QID) | ORAL | 0 refills | Status: AC | PRN
Start: 2022-12-17 — End: 2022-12-22

## 2022-12-17 NOTE — Progress Notes (Signed)
12/17/2022 1:18:15 PM By: Yevonne Pax RN Entered By: Yevonne Pax on 12/16/2022 16:49:33 Redford, [name] ([id number]) [id number] Page 4 of 7 -------------------------------------------------------------------------------- Problem List Details Patient Name: Date of Service: [name], [name]. 12/16/2022 3:45 PM Medical Record Number: [id number] Patient Account Number: [id number] Date of Birth/Sex: Treating RN: 1955/07/06 (67 y.o. 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 12/16/2022 3:45 PM Medical Record Number:  [id number] Patient Account Number: [id number] Date of Birth/Sex: Treating RN: Dec 29, 1955 (67 y.o. [name] Primary Care Provider: Saralyn Pilar Other Clinician: Referring Provider: Treating Provider/Extender: RO BSO N, [name], [name] in Treatment: 4 Subjective History of Present Illness (HPI) [name], [name] ([id number]) [id number] Page 5 of 7 11/18/2022 [name] is a 67 year old male with a past medical history of nerve conduction deafness that presents to the clinic for a 7 month history of nonhealing ulcers to the right lower extremity. He states these happened spontaneously and have progressively gotten worse. The more proximal wound has been present for 7 months and the most recent wound on the top of the foot/distal leg has been present for the past month. He has 5 wounds total. He visited the ED on 11/04/2022 for this issue. He was given cephalexin for which he completed. He has been using Several ointments at different times to address this issue. These ointments include Silvadene, mupirocin and clobetasol.. At times he keeps the wounds open to air. He also states he will go in the pool with the wounds exposed. For work he is on his feet for long periods of time. He does not wear compression stockings. ABIs in office were 0.8. He currently denies signs of infection. 8/28; patient presents for follow-up. He has been using Vashe wet-to-dry dressings to the wound beds. Patient's wound culture showed Pseudomonas aeruginosa sensitive to ciprofloxacin and this was sent into the pharmacy. Also recommended Keystone antibiotic ointment And this has already been ordered. He has not received this yet. He had a skin biopsy done as well that showed fibrosis inflammation. This was nonspecific. Patient declined debridement due to chronic pain. 9/4; patient presents for follow-up. He has been using Vashe wet-to-dry dressings to the  wound beds. He obtains the [facility] antibiotic ointment tomorrow. He has 1 day left of his oral antibiotics. There has been improvement in size since last clinic visit to the wound beds. He currently denies systemic signs of infection. 9/11; patient presents for follow-up. He has been using Keystone antibiotic ointment to the wound beds. He has tolerated this treatment well. Wound measurements are stable however periwound appears less irritated. 9/18; patient using Keystone and the ABD pad and Tubigrip. Unfortunately the Keystone cream coalesced over the surface of the wounds requiring an aggressive debridement to remove it. Under the [facility] the surface of the wound was not viable requiring further aggressive debridement. The patient has  12/17/2022 1:18:15 PM By: Yevonne Pax RN Entered By: Yevonne Pax on 12/16/2022 16:49:33 Redford, [name] ([id number]) [id number] Page 4 of 7 -------------------------------------------------------------------------------- Problem List Details Patient Name: Date of Service: [name], [name]. 12/16/2022 3:45 PM Medical Record Number: [id number] Patient Account Number: [id number] Date of Birth/Sex: Treating RN: 1955/07/06 (67 y.o. 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 12/16/2022 3:45 PM Medical Record Number:  [id number] Patient Account Number: [id number] Date of Birth/Sex: Treating RN: Dec 29, 1955 (67 y.o. [name] Primary Care Provider: Saralyn Pilar Other Clinician: Referring Provider: Treating Provider/Extender: RO BSO N, [name], [name] in Treatment: 4 Subjective History of Present Illness (HPI) [name], [name] ([id number]) [id number] Page 5 of 7 11/18/2022 [name] is a 67 year old male with a past medical history of nerve conduction deafness that presents to the clinic for a 7 month history of nonhealing ulcers to the right lower extremity. He states these happened spontaneously and have progressively gotten worse. The more proximal wound has been present for 7 months and the most recent wound on the top of the foot/distal leg has been present for the past month. He has 5 wounds total. He visited the ED on 11/04/2022 for this issue. He was given cephalexin for which he completed. He has been using Several ointments at different times to address this issue. These ointments include Silvadene, mupirocin and clobetasol.. At times he keeps the wounds open to air. He also states he will go in the pool with the wounds exposed. For work he is on his feet for long periods of time. He does not wear compression stockings. ABIs in office were 0.8. He currently denies signs of infection. 8/28; patient presents for follow-up. He has been using Vashe wet-to-dry dressings to the wound beds. Patient's wound culture showed Pseudomonas aeruginosa sensitive to ciprofloxacin and this was sent into the pharmacy. Also recommended Keystone antibiotic ointment And this has already been ordered. He has not received this yet. He had a skin biopsy done as well that showed fibrosis inflammation. This was nonspecific. Patient declined debridement due to chronic pain. 9/4; patient presents for follow-up. He has been using Vashe wet-to-dry dressings to the  wound beds. He obtains the [facility] antibiotic ointment tomorrow. He has 1 day left of his oral antibiotics. There has been improvement in size since last clinic visit to the wound beds. He currently denies systemic signs of infection. 9/11; patient presents for follow-up. He has been using Keystone antibiotic ointment to the wound beds. He has tolerated this treatment well. Wound measurements are stable however periwound appears less irritated. 9/18; patient using Keystone and the ABD pad and Tubigrip. Unfortunately the Keystone cream coalesced over the surface of the wounds requiring an aggressive debridement to remove it. Under the [facility] the surface of the wound was not viable requiring further aggressive debridement. The patient has  12/17/2022 1:18:15 PM By: Yevonne Pax RN Entered By: Yevonne Pax on 12/16/2022 16:49:33 Redford, [name] ([id number]) [id number] Page 4 of 7 -------------------------------------------------------------------------------- Problem List Details Patient Name: Date of Service: [name], [name]. 12/16/2022 3:45 PM Medical Record Number: [id number] Patient Account Number: [id number] Date of Birth/Sex: Treating RN: 1955/07/06 (67 y.o. 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 12/16/2022 3:45 PM Medical Record Number:  [id number] Patient Account Number: [id number] Date of Birth/Sex: Treating RN: Dec 29, 1955 (67 y.o. [name] Primary Care Provider: Saralyn Pilar Other Clinician: Referring Provider: Treating Provider/Extender: RO BSO N, [name], [name] in Treatment: 4 Subjective History of Present Illness (HPI) [name], [name] ([id number]) [id number] Page 5 of 7 11/18/2022 [name] is a 67 year old male with a past medical history of nerve conduction deafness that presents to the clinic for a 7 month history of nonhealing ulcers to the right lower extremity. He states these happened spontaneously and have progressively gotten worse. The more proximal wound has been present for 7 months and the most recent wound on the top of the foot/distal leg has been present for the past month. He has 5 wounds total. He visited the ED on 11/04/2022 for this issue. He was given cephalexin for which he completed. He has been using Several ointments at different times to address this issue. These ointments include Silvadene, mupirocin and clobetasol.. At times he keeps the wounds open to air. He also states he will go in the pool with the wounds exposed. For work he is on his feet for long periods of time. He does not wear compression stockings. ABIs in office were 0.8. He currently denies signs of infection. 8/28; patient presents for follow-up. He has been using Vashe wet-to-dry dressings to the wound beds. Patient's wound culture showed Pseudomonas aeruginosa sensitive to ciprofloxacin and this was sent into the pharmacy. Also recommended Keystone antibiotic ointment And this has already been ordered. He has not received this yet. He had a skin biopsy done as well that showed fibrosis inflammation. This was nonspecific. Patient declined debridement due to chronic pain. 9/4; patient presents for follow-up. He has been using Vashe wet-to-dry dressings to the  wound beds. He obtains the [facility] antibiotic ointment tomorrow. He has 1 day left of his oral antibiotics. There has been improvement in size since last clinic visit to the wound beds. He currently denies systemic signs of infection. 9/11; patient presents for follow-up. He has been using Keystone antibiotic ointment to the wound beds. He has tolerated this treatment well. Wound measurements are stable however periwound appears less irritated. 9/18; patient using Keystone and the ABD pad and Tubigrip. Unfortunately the Keystone cream coalesced over the surface of the wounds requiring an aggressive debridement to remove it. Under the [facility] the surface of the wound was not viable requiring further aggressive debridement. The patient has  [name], [name] ([id number]) [id number] Page 1 of 7 Visit Report for 12/16/2022 Debridement Details Patient Name: Date of Service: [name], [name]. 12/16/2022 3:45 PM Medical Record Number: [id number] Patient Account Number: [id number] Date of Birth/Sex: Treating RN: 11/20/1955 (67 y.o. Judie Petit) Yevonne Pax Primary Care Provider: Saralyn Pilar Other Clinician: Referring Provider: Treating Provider/Extender: RO BSO N, [name], [name] in Treatment: 4 Debridement Performed for Assessment: Wound #1 Right,Lateral Lower Leg Performed By: Physician [name] Caul, MD Debridement Type: Debridement Level of Consciousness (Pre-procedure): Awake and Alert Pre-procedure Verification/Time Out Yes - 16:43 Taken: Start Time: 16:43 Percent of Wound Bed Debrided: 40% T Area Debrided (cm): otal 36.61 Tissue and other material debrided: Viable, Non-Viable, Slough, Subcutaneous, Skin: Dermis , Skin: Epidermis, Slough Level: Skin/Subcutaneous Tissue Debridement Description: Excisional Instrument: Curette Bleeding: Moderate Hemostasis Achieved: Pressure End Time: 16:49 Procedural Pain: 9 Post Procedural Pain: 2 Response to Treatment: Procedure was tolerated well Level of Consciousness (Post- Awake and Alert procedure): Post Debridement Measurements of Total Wound Length: (cm) 10.6 Width: (cm) 11 Depth: (cm) 0.4 Volume: (cm) 36.631 Character of Wound/Ulcer Post Debridement: Improved Post Procedure Diagnosis Same as Pre-procedure Electronic Signature(s) Signed: 12/16/2022 5:02:29 PM By: Baltazar Najjar MD Signed: 12/17/2022 1:18:15 PM By: Yevonne Pax RN Entered By: Baltazar Najjar on 12/16/2022 16:59:51 Bridge, [name] ([id number]) [id number] Page 2 of 7 -------------------------------------------------------------------------------- HPI Details Patient Name: Date of Service: [name], [name]. 12/16/2022 3:45  PM Medical Record Number: [id number] Patient Account Number: [id number] Date of Birth/Sex: Treating RN: September 17, 1955 (67 y.o. Judie Petit) Yevonne Pax Primary Care Provider: Saralyn Pilar Other Clinician: Referring Provider: Treating Provider/Extender: RO BSO N, [name], [name] in Treatment: 4 History of Present Illness HPI Description: 11/18/2022 [name] is a 67 year old male with a past medical history of nerve conduction deafness that presents to the clinic for a 7 month history of nonhealing ulcers to the right lower extremity. He states these happened spontaneously and have progressively gotten worse. The more proximal wound has been present for 7 months and the most recent wound on the top of the foot/distal leg has been present for the past month. He has 5 wounds total. He visited the ED on 11/04/2022 for this issue. He was given cephalexin for which he completed. He has been using Several ointments at different times to address this issue. These ointments include Silvadene, mupirocin and clobetasol.. At times he keeps the wounds open to air. He also states he will go in the pool with the wounds exposed. For work he is on his feet for long periods of time. He does not wear compression stockings. ABIs in office were 0.8. He currently denies signs of infection. 8/28; patient presents for follow-up. He has been using Vashe wet-to-dry dressings to the wound beds. Patient's wound culture showed Pseudomonas aeruginosa sensitive to ciprofloxacin and this was sent into the pharmacy. Also recommended Keystone antibiotic ointment And this has already been ordered. He has not received this yet. He had a skin biopsy done as well that showed fibrosis inflammation. This was nonspecific. Patient declined debridement due to chronic pain. 9/4; patient presents for follow-up. He has been using Vashe wet-to-dry dressings to the wound beds. He obtains the [facility] antibiotic  ointment tomorrow. He has 1 day left of his oral antibiotics. There has been improvement in size since last clinic visit to the wound beds. He currently denies systemic signs of infection. 9/11; patient presents for follow-up. He has been  12/17/2022 1:18:15 PM By: Yevonne Pax RN Entered By: Yevonne Pax on 12/16/2022 16:49:33 Redford, [name] ([id number]) [id number] Page 4 of 7 -------------------------------------------------------------------------------- Problem List Details Patient Name: Date of Service: [name], [name]. 12/16/2022 3:45 PM Medical Record Number: [id number] Patient Account Number: [id number] Date of Birth/Sex: Treating RN: 1955/07/06 (67 y.o. 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 12/16/2022 3:45 PM Medical Record Number:  [id number] Patient Account Number: [id number] Date of Birth/Sex: Treating RN: Dec 29, 1955 (67 y.o. [name] Primary Care Provider: Saralyn Pilar Other Clinician: Referring Provider: Treating Provider/Extender: RO BSO N, [name], [name] in Treatment: 4 Subjective History of Present Illness (HPI) [name], [name] ([id number]) [id number] Page 5 of 7 11/18/2022 [name] is a 67 year old male with a past medical history of nerve conduction deafness that presents to the clinic for a 7 month history of nonhealing ulcers to the right lower extremity. He states these happened spontaneously and have progressively gotten worse. The more proximal wound has been present for 7 months and the most recent wound on the top of the foot/distal leg has been present for the past month. He has 5 wounds total. He visited the ED on 11/04/2022 for this issue. He was given cephalexin for which he completed. He has been using Several ointments at different times to address this issue. These ointments include Silvadene, mupirocin and clobetasol.. At times he keeps the wounds open to air. He also states he will go in the pool with the wounds exposed. For work he is on his feet for long periods of time. He does not wear compression stockings. ABIs in office were 0.8. He currently denies signs of infection. 8/28; patient presents for follow-up. He has been using Vashe wet-to-dry dressings to the wound beds. Patient's wound culture showed Pseudomonas aeruginosa sensitive to ciprofloxacin and this was sent into the pharmacy. Also recommended Keystone antibiotic ointment And this has already been ordered. He has not received this yet. He had a skin biopsy done as well that showed fibrosis inflammation. This was nonspecific. Patient declined debridement due to chronic pain. 9/4; patient presents for follow-up. He has been using Vashe wet-to-dry dressings to the  wound beds. He obtains the [facility] antibiotic ointment tomorrow. He has 1 day left of his oral antibiotics. There has been improvement in size since last clinic visit to the wound beds. He currently denies systemic signs of infection. 9/11; patient presents for follow-up. He has been using Keystone antibiotic ointment to the wound beds. He has tolerated this treatment well. Wound measurements are stable however periwound appears less irritated. 9/18; patient using Keystone and the ABD pad and Tubigrip. Unfortunately the Keystone cream coalesced over the surface of the wounds requiring an aggressive debridement to remove it. Under the [facility] the surface of the wound was not viable requiring further aggressive debridement. The patient has

## 2022-12-17 NOTE — Progress Notes (Signed)
Physician: Treating Physician/Extender: RO BSO N, [name], [name] in Treatment: 4 Education Assessment Education Provided To: Patient Education Topics Provided Wound/Skin Impairment: Handouts: Caring for Your Ulcer Methods: Explain/Verbal Responses: State content correctly Electronic Signature(s) Signed: 12/17/2022 1:18:15 PM By: Yevonne Pax RN Entered By: Yevonne Pax on 12/16/2022 16:25:53 -------------------------------------------------------------------------------- Wound Assessment Details Patient Name: Date of Service: [name], Keigan M. 12/16/2022 3:45 PM Medical Record Number: [id number] Patient Account Number:  [id number] Date of Birth/Sex: Treating RN: 1955/12/03 (67 y.o. Judie Petit) Yevonne Pax Primary Care Lennyx Verdell: Saralyn Pilar Other Clinician: Referring Brylin Stopper: Treating Jarika Robben/Extender: RO BSO N, [name], [name] in Treatment: 4 Wound Status Wound Number: 1 Primary Etiology: Atypical Wound Location: Right, Lateral Lower Leg Wound Status: Open Wounding Event: Gradually Appeared Comorbid History: Asthma, Hypertension Date Acquired: 08/29/2022 Weeks Of Treatment: 4 Clustered Wound: Yes Maves, [name] ([id number]) [id number] Page 8 of 9 Photos Wound Measurements Length: (cm) 10.6 Width: (cm) 11 Depth: (cm) 0.4 Area: (cm) 91.577 Volume: (cm) 36.631 % Reduction in Area: 3.6% % Reduction in Volume: 3.6% Epithelialization: None Tunneling: No Undermining: No Wound Description Classification: Full Thickness Without Exposed Suppor Exudate Amount: Medium Exudate Type: Serosanguineous Exudate Color: red, brown t Structures Foul Odor After Cleansing: No Slough/Fibrino Yes Wound Bed Granulation Amount: None Present (0%) Exposed Structure Necrotic Amount: Large (67-100%) Fascia Exposed: No Necrotic Quality: Eschar Fat Layer (Subcutaneous Tissue) Exposed: Yes Tendon Exposed: No Muscle Exposed: No Joint Exposed: No Bone Exposed: No Treatment Notes Wound #1 (Lower Leg) Wound Laterality: Right, Lateral Cleanser Byram Ancillary Kit - 15 Day Supply Discharge Instruction: Use supplies as instructed; Kit contains: (15) Saline Bullets; (15) 3x3 Gauze; 15 pr Gloves Vashe 5.8 (oz) Discharge Instruction: Use vashe 5.8 (oz) as directed Peri-Wound Care Topical Primary Dressing Gauze Discharge Instruction: moistened with vashe Solution until keystone arrives then dc vashe and replace with keystone w/d Secondary Dressing ABD Pad 5x9 (in/in) Discharge Instruction: Cover with ABD pad Secured With Tubigrip Size D, 3x10 (in/yd) Compression  Wrap Compression Stockings Add-Ons Electronic Signature(s) Signed: 12/17/2022 1:18:15 PM By: Yevonne Pax RN Entered By: Yevonne Pax on 12/16/2022 16:24:25 Fan, [name] ([id number]) [id number] Page 9 of 9 -------------------------------------------------------------------------------- Vitals Details Patient Name: Date of Service: BRAN, LYU. 12/16/2022 3:45 PM Medical Record Number: [id number] Patient Account Number: [id number] Date of Birth/Sex: Treating RN: 04-03-1955 (67 y.o. Judie Petit) Yevonne Pax Primary Care Peyten Punches: Saralyn Pilar Other Clinician: Referring Julizza Sassone: Treating Lanie Schelling/Extender: RO BSO N, [name], [name] in Treatment: 4 Vital Signs Time Taken: 16:14 Temperature (F): 98 Height (in): 67 Pulse (bpm): 59 Weight (lbs): 174 Respiratory Rate (breaths/min): 16 Body Mass Index (BMI): 27.2 Blood Pressure (mmHg): 141/65 Reference Range: 80 - 120 mg / dl Electronic Signature(s) Signed: 12/17/2022 1:18:15 PM By: Yevonne Pax RN Entered By: Yevonne Pax on 12/16/2022 16:16:10  Atypical N/A N/A Primary Etiology: Asthma, Hypertension N/A N/A Comorbid History: 08/29/2022 N/A N/A Date Acquired: 4 N/A N/A Weeks of Treatment: Open N/A N/A Wound Status: No N/A N/A Wound Recurrence: Yes N/A N/A Clustered Wound: Bagshaw, [name] ([id number]) [id number] Page 5 of 9 10.6x11x0.4 N/A N/A Measurements L x W x D (cm) 91.577 N/A N/A A (cm) : rea 36.631 N/A N/A Volume (cm) : 3.60% N/A N/A % Reduction in Area: 3.60% N/A N/A % Reduction in Volume: Full Thickness Without Exposed N/A N/A Classification: Support Structures Medium N/A N/A Exudate A mount: Serosanguineous N/A N/A Exudate Type: red, brown N/A N/A Exudate Color: None Present (0%) N/A N/A Granulation Amount: Large (67-100%) N/A N/A Necrotic Amount: Eschar N/A N/A Necrotic Tissue: Fat Layer (Subcutaneous Tissue): Yes N/A N/A Exposed Structures: Fascia: No Tendon: No Muscle: No Joint: No Bone: No None N/A N/A Epithelialization: Treatment Notes Electronic Signature(s) Signed: 12/17/2022 1:18:15 PM By: Yevonne Pax RN Entered By: Yevonne Pax on 12/16/2022 16:25:33 -------------------------------------------------------------------------------- Multi-Disciplinary Care Plan Details Patient Name: Date of Service: [name], Draxton M. 12/16/2022 3:45 PM Medical Record Number: [id number] Patient Account Number: [id number] Date of  Birth/Sex: Treating RN: 06/23/1955 (67 y.o. Judie Petit) Yevonne Pax Primary Care Teddie Mehta: Saralyn Pilar Other Clinician: Referring Chrissy Ealey: Treating Tristian Sickinger/Extender: RO BSO N, [name], [name] in Treatment: 4 Active Inactive Necrotic Tissue Nursing Diagnoses: Knowledge deficit related to management of necrotic/devitalized tissue Goals: Patient/caregiver will verbalize understanding of reason and process for debridement of necrotic tissue Date Initiated: 11/18/2022 Target Resolution Date: 12/19/2022 Goal Status: Active Interventions: Assess patient pain level pre-, during and post procedure and prior to discharge Notes: Wound/Skin Impairment Nursing Diagnoses: Knowledge deficit related to ulceration/compromised skin integrity Goals: Patient/caregiver will verbalize understanding of skin care regimen Date Initiated: 11/18/2022 Target Resolution Date: 12/19/2022 Goal Status: Active KHALEE, OZAN ([id number]) [id number] Page 6 of 9 Ulcer/skin breakdown will have a volume reduction of 30% by week 4 Date Initiated: 11/18/2022 Target Resolution Date: 12/19/2022 Goal Status: Active Ulcer/skin breakdown will have a volume reduction of 50% by week 8 Date Initiated: 11/18/2022 Target Resolution Date: 01/18/2023 Goal Status: Active Ulcer/skin breakdown will have a volume reduction of 80% by week 12 Date Initiated: 11/18/2022 Target Resolution Date: 02/18/2023 Goal Status: Active Ulcer/skin breakdown will heal within 14 weeks Date Initiated: 11/18/2022 Target Resolution Date: 03/20/2023 Goal Status: Active Interventions: Assess patient/caregiver ability to obtain necessary supplies Assess patient/caregiver ability to perform ulcer/skin care regimen upon admission and as needed Assess ulceration(s) every visit Notes: Electronic Signature(s) Signed: 12/17/2022 1:18:15 PM By: Yevonne Pax RN Entered By: Yevonne Pax on 12/16/2022  16:25:40 -------------------------------------------------------------------------------- Pain Assessment Details Patient Name: Date of Service: [name], Drago M. 12/16/2022 3:45 PM Medical Record Number: [id number] Patient Account Number: [id number] Date of Birth/Sex: Treating RN: 02/08/1956 (67 y.o. [name] Primary Care Halena Mohar: Saralyn Pilar Other Clinician: Referring Aoife Bold: Treating Khalif Stender/Extender: RO BSO N, [name], [name] in Treatment: 4 Active Problems Location of Pain Severity and Description of Pain Patient Has Paino No Site Locations Pain Management and Medication Current Pain Management: SHABSI, CUTHBERT ([id number]) [id number] Page 7 of 9 Electronic Signature(s) Signed: 12/17/2022 1:18:15 PM By: Yevonne Pax RN Entered By: Yevonne Pax on 12/16/2022 16:16:29 -------------------------------------------------------------------------------- Patient/Caregiver Education Details Patient Name: Date of Service: [name] 9/18/2024andnbsp3:45 PM Medical Record Number: [id number] Patient Account Number: [id number] Date of Birth/Gender: Treating RN: 1955/05/03 (67 y.o. [name] Primary Care Physician: Saralyn Pilar Other Clinician: Referring  cushion, seat, etc.) INTERVENTIONS - Wound Cleansing / Measurement []  - 0 Simple Wound Cleansing - one wound []  - 0 Complex Wound Cleansing - multiple wounds []  - 0 Wound Imaging (photographs - any number of wounds) []  - 0 Wound Tracing (instead of photographs) []  - 0 Simple Wound Measurement - one wound []  - 0 Complex Wound Measurement - multiple wounds INTERVENTIONS - Wound Dressings []  - 0 Small Wound Dressing one or multiple wounds []  - 0 Medium Wound Dressing one or multiple wounds []  - 0 Large Wound Dressing one or multiple wounds []  - 0 Application of Medications - topical []  - 0 Application of Medications - injection INTERVENTIONS - Miscellaneous []  - 0 External ear exam Crumby, [name] ([id number]) [id number] Page 3 of 9 []  - 0 Specimen Collection (cultures, biopsies, blood, body fluids, etc.) []  - 0 Specimen(s) / Culture(s) sent or taken to Lab for analysis []  - 0 Patient Transfer (multiple staff / Michiel Sites Lift / Similar devices) []  - 0 Simple Staple / Suture removal (25 or less) []  - 0 Complex Staple / Suture removal (26 or more) []  - 0 Hypo / Hyperglycemic Management (close monitor of Blood Glucose) []  - 0 Ankle / Brachial Index (ABI) - do not check if billed separately []  - 0 Vital Signs Has the patient been seen at the hospital within the last three years: Yes Total Score: 0 Level Of Care: ____ Electronic Signature(s) Signed: 12/17/2022 1:18:15 PM By: Yevonne Pax RN Entered By: Yevonne Pax on 12/16/2022 17:13:55 -------------------------------------------------------------------------------- Encounter Discharge Information Details Patient Name: Date of Service: [name], Har[place] M. 12/16/2022 3:45 PM Medical Record Number:  [id number] Patient Account Number: [id number] Date of Birth/Sex: Treating RN: June 03, 1955 (67 y.o. [name] Primary Care Trayshawn Durkin: Saralyn Pilar Other Clinician: Referring Tye Vigo: Treating Bentlee Benningfield/Extender: RO BSO N, [name], [name] in Treatment: 4 Encounter Discharge Information Items Post Procedure Vitals Discharge Condition: Stable Temperature (F): 97.8 Ambulatory Status: Ambulatory Pulse (bpm): 58 Discharge Destination: Home Respiratory Rate (breaths/min): 16 Transportation: Private Auto Blood Pressure (mmHg): 122/72 Accompanied By: wife Schedule Follow-up Appointment: Yes Clinical Summary of Care: Electronic Signature(s) Signed: 12/16/2022 5:15:16 PM By: Yevonne Pax RN Entered By: Yevonne Pax on 12/16/2022 17:15:15 Lower Extremity Assessment Details -------------------------------------------------------------------------------- Judye Bos ([id number]) [id number] Page 4 of 9 Patient Name: Date of Service: MONTRICE, TIDRICK. 12/16/2022 3:45 PM Medical Record Number: [id number] Patient Account Number: [id number] Date of Birth/Sex: Treating RN: 1956/03/14 (67 y.o. Judie Petit) Yevonne Pax Primary Care Tuere Nwosu: Saralyn Pilar Other Clinician: Referring Brook Geraci: Treating Princess Karnes/Extender: RO BSO N, [name], [name] in Treatment: 4 Edema Assessment Left: Right: Assessed: No No Edema: Calf Left: Right: Point of Measurement: 32 cm From Medial Instep 37 cm Ankle Left: Right: Point of Measurement: 11 cm From Medial Instep 24 cm Electronic Signature(s) Signed: 12/17/2022 1:18:15 PM By: Yevonne Pax RN Entered By: Yevonne Pax on 12/16/2022 16:25:28 -------------------------------------------------------------------------------- Multi Wound Chart Details Patient Name: Date of Service: [name], Hester M. 12/16/2022 3:45 PM Medical Record Number: [id number] Patient Account Number:  [id number] Date of Birth/Sex: Treating RN: 07-13-1955 (67 y.o. [name] Primary Care Forestine Macho: Saralyn Pilar Other Clinician: Referring Eileen Croswell: Treating Zaleah Ternes/Extender: RO BSO N, [name], [name] in Treatment: 4 Vital Signs Height(in): 67 Pulse(bpm): 59 Weight(lbs): 174 Blood Pressure(mmHg): 141/65 Body Mass Index(BMI): 27.2 Temperature(F): 98 Respiratory Rate(breaths/min): 16 [1:Photos:] [N/A:N/A] Right, Lateral Lower Leg N/A N/A Wound Location: Gradually Appeared N/A N/A Wounding Event:  TEJA, ARREOLA ([id number]) [id number] Page 1 of 9 Visit Report for 12/16/2022 Arrival Information Details Patient Name: Date of Service: DAESHAWN, EICHHOLZ. 12/16/2022 3:45 PM Medical Record Number: [id number] Patient Account Number: [id number] Date of Birth/Sex: Treating RN: 05-15-1955 (67 y.o. Judie Petit) Yevonne Pax Primary Care Zaydan Papesh: Saralyn Pilar Other Clinician: Referring Wilkes Potvin: Treating Nadezhda Pollitt/Extender: RO BSO N, [name], [name] in Treatment: 4 Visit Information History Since Last Visit Added or deleted any medications: No Patient Arrived: Ambulatory Any new allergies or adverse reactions: No Arrival Time: 16:15 Had a fall or experienced change in No Accompanied By: wife activities of daily living that may affect Transfer Assistance: None risk of falls: Patient Identification Verified: Yes Signs or symptoms of abuse/neglect since last visito No Secondary Verification Process Completed: Yes Hospitalized since last visit: No Patient Requires Transmission-Based Precautions: No Implantable device outside of the clinic excluding No Patient Has Alerts: No cellular tissue based products placed in the center since last visit: Has Dressing in Place as Prescribed: Yes Has Compression in Place as Prescribed: Yes Pain Present Now: No Electronic Signature(s) Signed: 12/17/2022 1:18:15 PM By: Yevonne Pax RN Entered By: Yevonne Pax on 12/16/2022 16:15:42 -------------------------------------------------------------------------------- Clinic Level of Care Assessment Details Patient Name: Date of Service: KARTIER, ATER. 12/16/2022 3:45 PM Medical Record Number: [id number] Patient Account Number: [id number] Date of Birth/Sex: Treating RN: 10-10-1955 (67 y.o. Judie Petit) Yevonne Pax Primary Care Aulani Shipton: Saralyn Pilar Other Clinician: Referring Jermisha Hoffart: Treating Mahkayla Preece/Extender: RO BSO N, [name],  [name] in Treatment: 4 Clinic Level of Care Assessment Items TOOL 4 Quantity Score []  - 0 Use when only an EandM is performed on FOLLOW-UP visit ASSESSMENTS - Nursing Assessment / Reassessment []  - 0 Reassessment of Co-morbidities (includes updates in patient status) []  - 0 Reassessment of Adherence to Treatment Plan GLENFORD, CANTERA ([id number]) [id number] Page 2 of 9 ASSESSMENTS - Wound and Skin A ssessment / Reassessment []  - 0 Simple Wound Assessment / Reassessment - one wound []  - 0 Complex Wound Assessment / Reassessment - multiple wounds []  - 0 Dermatologic / Skin Assessment (not related to wound area) ASSESSMENTS - Focused Assessment []  - 0 Circumferential Edema Measurements - multi extremities []  - 0 Nutritional Assessment / Counseling / Intervention []  - 0 Lower Extremity Assessment (monofilament, tuning fork, pulses) []  - 0 Peripheral Arterial Disease Assessment (using hand held doppler) ASSESSMENTS - Ostomy and/or Continence Assessment and Care []  - 0 Incontinence Assessment and Management []  - 0 Ostomy Care Assessment and Management (repouching, etc.) PROCESS - Coordination of Care []  - 0 Simple Patient / Family Education for ongoing care []  - 0 Complex (extensive) Patient / Family Education for ongoing care []  - 0 Staff obtains Chiropractor, Records, T Results / Process Orders est []  - 0 Staff telephones HHA, Nursing Homes / Clarify orders / etc []  - 0 Routine Transfer to another Facility (non-emergent condition) []  - 0 Routine Hospital Admission (non-emergent condition) []  - 0 New Admissions / Manufacturing engineer / Ordering NPWT Apligraf, etc. , []  - 0 Emergency Hospital Admission (emergent condition) []  - 0 Simple Discharge Coordination []  - 0 Complex (extensive) Discharge Coordination PROCESS - Special Needs []  - 0 Pediatric / Minor Patient Management []  - 0 Isolation Patient Management []  - 0 Hearing /  Language / Visual special needs []  - 0 Assessment of Community assistance (transportation, D/C planning, etc.) []  - 0 Additional assistance / Altered mentation []  - 0 Support Surface(s) Assessment (bed,

## 2022-12-17 NOTE — Addendum Note (Signed)
Addended by: Smitty Cords on: 12/17/2022 04:40 PM   Modules accepted: Orders

## 2022-12-23 ENCOUNTER — Encounter: Payer: BC Managed Care – PPO | Admitting: Internal Medicine

## 2022-12-23 DIAGNOSIS — L97812 Non-pressure chronic ulcer of other part of right lower leg with fat layer exposed: Secondary | ICD-10-CM | POA: Diagnosis not present

## 2022-12-23 DIAGNOSIS — I10 Essential (primary) hypertension: Secondary | ICD-10-CM | POA: Diagnosis not present

## 2022-12-23 DIAGNOSIS — J45909 Unspecified asthma, uncomplicated: Secondary | ICD-10-CM | POA: Diagnosis not present

## 2022-12-23 DIAGNOSIS — L97818 Non-pressure chronic ulcer of other part of right lower leg with other specified severity: Secondary | ICD-10-CM | POA: Diagnosis not present

## 2022-12-23 DIAGNOSIS — I87311 Chronic venous hypertension (idiopathic) with ulcer of right lower extremity: Secondary | ICD-10-CM | POA: Diagnosis not present

## 2022-12-23 DIAGNOSIS — H908 Mixed conductive and sensorineural hearing loss, unspecified: Secondary | ICD-10-CM | POA: Diagnosis not present

## 2022-12-24 ENCOUNTER — Other Ambulatory Visit (INDEPENDENT_AMBULATORY_CARE_PROVIDER_SITE_OTHER): Payer: Self-pay | Admitting: Internal Medicine

## 2022-12-24 DIAGNOSIS — L97919 Non-pressure chronic ulcer of unspecified part of right lower leg with unspecified severity: Secondary | ICD-10-CM

## 2022-12-24 DIAGNOSIS — I87311 Chronic venous hypertension (idiopathic) with ulcer of right lower extremity: Secondary | ICD-10-CM

## 2022-12-24 DIAGNOSIS — I83018 Varicose veins of right lower extremity with ulcer other part of lower leg: Secondary | ICD-10-CM

## 2022-12-25 NOTE — Progress Notes (Signed)
[name], [name] ([id number]) [id number] Page 1 of 9 Visit Report for 12/23/2022 Arrival Information Details Patient Name: Date of Service: [name], [name]. 12/23/2022 7:30 [name] Medical Record Number: [id number] Patient Account Number: [id number] Date of Birth/Sex: Treating RN: 07-21-1955 (67 y.o. [name] Primary Care [name]: Saralyn Pilar Other Clinician: Referring [name]: Treating [name]/Extender: RO BSO N, [name], [name] in Treatment: 5 Visit Information History Since Last Visit Added or deleted any medications: No Patient Arrived: Ambulatory Any new allergies or adverse reactions: No Arrival Time: 07:35 Has Dressing in Place as Prescribed: Yes Accompanied By: wife Pain Present Now: No Transfer Assistance: None Patient Identification Verified: Yes Secondary Verification Process Completed: Yes Patient Requires Transmission-Based Precautions: No Patient Has Alerts: No Electronic Signature(s) Signed: 12/25/2022 12:30:42 PM By: Midge Aver MSN RN CNS WTA Entered By: Midge Aver on 12/23/2022 04:40:02 -------------------------------------------------------------------------------- Clinic Level of Care Assessment Details Patient Name: Date of Service: [name], [name]. 12/23/2022 7:30 [name] Medical Record Number: [id number] Patient Account Number: [id number] Date of Birth/Sex: Treating RN: Feb 27, 1956 (67 y.o. [name] Primary Care Muriel Wilber: Saralyn Pilar Other Clinician: Referring Shawnique Mariotti: Treating Antwonette Feliz/Extender: RO BSO N, [name], [name] in Treatment: 5 Clinic Level of Care Assessment Items TOOL 1 Quantity Score []  - 0 Use when EandM and Procedure is performed on INITIAL visit ASSESSMENTS - Nursing Assessment / Reassessment []  - 0 General Physical Exam (combine w/ comprehensive assessment (listed just below) when performed on new pt. evals) []  - 0 Comprehensive  Assessment (HX, ROS, Risk Assessments, Wounds Hx, etc.) ASSESSMENTS - Wound and Skin Assessment / Reassessment []  - 0 Dermatologic / Skin Assessment (not related to wound area) ASSESSMENTS - Ostomy and/or Continence Assessment and Care [name], [name] ([id number]) [id number] Page 2 of 9 []  - 0 Incontinence Assessment and Management []  - 0 Ostomy Care Assessment and Management (repouching, etc.) PROCESS - Coordination of Care []  - 0 Simple Patient / Family Education for ongoing care []  - 0 Complex (extensive) Patient / Family Education for ongoing care []  - 0 Staff obtains Chiropractor, Records, T Results / Process Orders est []  - 0 Staff telephones HHA, Nursing Homes / Clarify orders / etc []  - 0 Routine Transfer to another Facility (non-emergent condition) []  - 0 Routine Hospital Admission (non-emergent condition) []  - 0 New Admissions / Manufacturing engineer / Ordering NPWT Apligraf, etc. , []  - 0 Emergency Hospital Admission (emergent condition) PROCESS - Special Needs []  - 0 Pediatric / Minor Patient Management []  - 0 Isolation Patient Management []  - 0 Hearing / Language / Visual special needs []  - 0 Assessment of Community assistance (transportation, D/C planning, etc.) []  - 0 Additional assistance / Altered mentation []  - 0 Support Surface(s) Assessment (bed, cushion, seat, etc.) INTERVENTIONS - Miscellaneous []  - 0 External ear exam []  - 0 Patient Transfer (multiple staff / Nurse, adult / Similar devices) []  - 0 Simple Staple / Suture removal (25 or less) []  - 0 Complex Staple / Suture removal (26 or more) []  - 0 Hypo/Hyperglycemic Management (do not check if billed separately) []  - 0 Ankle / Brachial Index (ABI) - do not check if billed separately Has the patient been seen at the hospital within the last three years: Yes Total Score: 0 Level Of Care: ____ Electronic Signature(s) Signed: 12/25/2022 12:30:42 PM By: Midge Aver MSN  RN CNS WTA Entered By: Midge Aver on 12/23/2022 05:10:39 -------------------------------------------------------------------------------- Encounter Discharge Information Details Patient Name:  Date of Service: [name], [name]. 12/23/2022 7:30 [name] Medical Record Number: [id number] Patient Account Number: [id number] Date of Birth/Sex: Treating RN: 1955/05/10 (67 y.o. [name] Primary Care Letetia Romanello: Saralyn Pilar Other Clinician: Referring Sawyer Kahan: Treating Lucero Auzenne/Extender: RO BSO N, [name], [name] in Treatment: 5 Encounter Discharge Information Items Post Procedure Vitals Discharge Condition: Stable Temperature (F): 97.7 [name] ([id number]) [id number] Page 3 of 9 Ambulatory Status: Ambulatory Pulse (bpm): 66 Discharge Destination: Home Respiratory Rate (breaths/min): 18 Transportation: Private Auto Blood Pressure (mmHg): 130/74 Accompanied By: wife Schedule Follow-up Appointment: Yes Clinical Summary of Care: Electronic Signature(s) Signed: 12/23/2022 4:55:08 PM By: Midge Aver MSN RN CNS WTA Previous Signature: 12/23/2022 10:22:14 AM Version By: Midge Aver MSN RN CNS WTA Entered By: Midge Aver on 12/23/2022 13:55:07 -------------------------------------------------------------------------------- Lower Extremity Assessment Details Patient Name: Date of Service: [name], [name]. 12/23/2022 7:30 [name] Medical Record Number: [id number] Patient Account Number: [id number] Date of Birth/Sex: Treating RN: [birth year] (67 y.o. [name] Primary Care Abisai Coble: Saralyn Pilar Other Clinician: Referring Greysen Devino: Treating Estefana Taylor/Extender: RO BSO N, [name], [name] in Treatment: 5 Edema Assessment Assessed: [Left: No] [Right: No] [Left: Edema] [Right: :] Calf Left: Right: Point of Measurement: 32 cm From Medial Instep 37 cm Ankle Left: Right: Point of Measurement: 11 cm  From Medial Instep 24 cm Vascular Assessment Pulses: Dorsalis Pedis Palpable: [Right:Yes] Extremity colors, hair growth, and conditions: Extremity Color: [Right:Dusky] Hair Growth on Extremity: [Right:No] Temperature of Extremity: [Right:Warm] Capillary Refill: [Right:< 3 seconds] Dependent Rubor: [Right:No] Blanched when Elevated: [Right:No No] Toe Nail Assessment Left: Right: Thick: No Discolored: No Deformed: No Improper Length and Hygiene: No Electronic Signature(s) Signed: 12/25/2022 12:30:42 PM By: Midge Aver MSN RN CNS WTA Entered By: Midge Aver on 12/23/2022 04:49:43 Delaurentis, [name] ([id number]) [id number] Page 4 of 9 -------------------------------------------------------------------------------- Multi Wound Chart Details Patient Name: Date of Service: HUXTON, GLAUS. 12/23/2022 7:30 [name] Medical Record Number: [id number] Patient Account Number: [id number] Date of Birth/Sex: Treating RN: 27-Nov-1955 (67 y.o. [name] Primary Care TRUE Shackleford: Saralyn Pilar Other Clinician: Referring Hosie Sharman: Treating Lanis Storlie/Extender: RO BSO N, [name], [name] in Treatment: 5 Vital Signs Height(in): 67 Pulse(bpm): 66 Weight(lbs): 174 Blood Pressure(mmHg): 130/74 Body Mass Index(BMI): 27.2 Temperature(F): 97.7 Respiratory Rate(breaths/min): 18 [1:Photos:] [N/A:N/A] Right, Lateral Lower Leg N/A N/A Wound Location: Gradually Appeared N/A N/A Wounding Event: Atypical N/A N/A Primary Etiology: Asthma, Hypertension N/A N/A Comorbid History: 08/29/2022 N/A N/A Date Acquired: 5 N/A N/A Weeks of Treatment: Open N/A N/A Wound Status: No N/A N/A Wound Recurrence: Yes N/A N/A Clustered Wound: 11x9x0.4 N/A N/A Measurements L x W x D (cm) 77.754 N/A N/A A (cm) : rea 31.102 N/A N/A Volume (cm) : 18.20% N/A N/A % Reduction in A rea: 18.20% N/A N/A % Reduction in Volume: Full Thickness Without Exposed N/A  N/A Classification: Support Structures Medium N/A N/A Exudate A mount: Serosanguineous N/A N/A Exudate Type: red, brown N/A N/A Exudate Color: None Present (0%) N/A N/A Granulation Amount: Large (67-100%) N/A N/A Necrotic Amount: Eschar N/A N/A Necrotic Tissue: Fat Layer (Subcutaneous Tissue): Yes N/A N/A Exposed Structures: Fascia: No Tendon: No Muscle: No Joint: No Bone: No None N/A N/A Epithelialization: Treatment Notes Electronic Signature(s) Signed: 12/25/2022 12:30:42 PM By: Midge Aver MSN RN CNS WTA Entered By: Midge Aver on 12/23/2022 05:06:21 Caudill, [name] ([id number]) [id number] Page 5 of 9 -------------------------------------------------------------------------------- Multi-Disciplinary Care Plan Details Patient Name:  [name], [name] ([id number]) [id number] Page 1 of 9 Visit Report for 12/23/2022 Arrival Information Details Patient Name: Date of Service: [name], [name]. 12/23/2022 7:30 [name] Medical Record Number: [id number] Patient Account Number: [id number] Date of Birth/Sex: Treating RN: 07-21-1955 (67 y.o. [name] Primary Care [name]: Saralyn Pilar Other Clinician: Referring [name]: Treating [name]/Extender: RO BSO N, [name], [name] in Treatment: 5 Visit Information History Since Last Visit Added or deleted any medications: No Patient Arrived: Ambulatory Any new allergies or adverse reactions: No Arrival Time: 07:35 Has Dressing in Place as Prescribed: Yes Accompanied By: wife Pain Present Now: No Transfer Assistance: None Patient Identification Verified: Yes Secondary Verification Process Completed: Yes Patient Requires Transmission-Based Precautions: No Patient Has Alerts: No Electronic Signature(s) Signed: 12/25/2022 12:30:42 PM By: Midge Aver MSN RN CNS WTA Entered By: Midge Aver on 12/23/2022 04:40:02 -------------------------------------------------------------------------------- Clinic Level of Care Assessment Details Patient Name: Date of Service: [name], [name]. 12/23/2022 7:30 [name] Medical Record Number: [id number] Patient Account Number: [id number] Date of Birth/Sex: Treating RN: Feb 27, 1956 (67 y.o. [name] Primary Care Muriel Wilber: Saralyn Pilar Other Clinician: Referring Shawnique Mariotti: Treating Antwonette Feliz/Extender: RO BSO N, [name], [name] in Treatment: 5 Clinic Level of Care Assessment Items TOOL 1 Quantity Score []  - 0 Use when EandM and Procedure is performed on INITIAL visit ASSESSMENTS - Nursing Assessment / Reassessment []  - 0 General Physical Exam (combine w/ comprehensive assessment (listed just below) when performed on new pt. evals) []  - 0 Comprehensive  Assessment (HX, ROS, Risk Assessments, Wounds Hx, etc.) ASSESSMENTS - Wound and Skin Assessment / Reassessment []  - 0 Dermatologic / Skin Assessment (not related to wound area) ASSESSMENTS - Ostomy and/or Continence Assessment and Care [name], [name] ([id number]) [id number] Page 2 of 9 []  - 0 Incontinence Assessment and Management []  - 0 Ostomy Care Assessment and Management (repouching, etc.) PROCESS - Coordination of Care []  - 0 Simple Patient / Family Education for ongoing care []  - 0 Complex (extensive) Patient / Family Education for ongoing care []  - 0 Staff obtains Chiropractor, Records, T Results / Process Orders est []  - 0 Staff telephones HHA, Nursing Homes / Clarify orders / etc []  - 0 Routine Transfer to another Facility (non-emergent condition) []  - 0 Routine Hospital Admission (non-emergent condition) []  - 0 New Admissions / Manufacturing engineer / Ordering NPWT Apligraf, etc. , []  - 0 Emergency Hospital Admission (emergent condition) PROCESS - Special Needs []  - 0 Pediatric / Minor Patient Management []  - 0 Isolation Patient Management []  - 0 Hearing / Language / Visual special needs []  - 0 Assessment of Community assistance (transportation, D/C planning, etc.) []  - 0 Additional assistance / Altered mentation []  - 0 Support Surface(s) Assessment (bed, cushion, seat, etc.) INTERVENTIONS - Miscellaneous []  - 0 External ear exam []  - 0 Patient Transfer (multiple staff / Nurse, adult / Similar devices) []  - 0 Simple Staple / Suture removal (25 or less) []  - 0 Complex Staple / Suture removal (26 or more) []  - 0 Hypo/Hyperglycemic Management (do not check if billed separately) []  - 0 Ankle / Brachial Index (ABI) - do not check if billed separately Has the patient been seen at the hospital within the last three years: Yes Total Score: 0 Level Of Care: ____ Electronic Signature(s) Signed: 12/25/2022 12:30:42 PM By: Midge Aver MSN  RN CNS WTA Entered By: Midge Aver on 12/23/2022 05:10:39 -------------------------------------------------------------------------------- Encounter Discharge Information Details Patient Name:  Date of Service: [name], [name]. 12/23/2022 7:30 [name] Medical Record Number: [id number] Patient Account Number: [id number] Date of Birth/Sex: Treating RN: 1955/05/10 (67 y.o. [name] Primary Care Letetia Romanello: Saralyn Pilar Other Clinician: Referring Sawyer Kahan: Treating Lucero Auzenne/Extender: RO BSO N, [name], [name] in Treatment: 5 Encounter Discharge Information Items Post Procedure Vitals Discharge Condition: Stable Temperature (F): 97.7 [name] ([id number]) [id number] Page 3 of 9 Ambulatory Status: Ambulatory Pulse (bpm): 66 Discharge Destination: Home Respiratory Rate (breaths/min): 18 Transportation: Private Auto Blood Pressure (mmHg): 130/74 Accompanied By: wife Schedule Follow-up Appointment: Yes Clinical Summary of Care: Electronic Signature(s) Signed: 12/23/2022 4:55:08 PM By: Midge Aver MSN RN CNS WTA Previous Signature: 12/23/2022 10:22:14 AM Version By: Midge Aver MSN RN CNS WTA Entered By: Midge Aver on 12/23/2022 13:55:07 -------------------------------------------------------------------------------- Lower Extremity Assessment Details Patient Name: Date of Service: [name], [name]. 12/23/2022 7:30 [name] Medical Record Number: [id number] Patient Account Number: [id number] Date of Birth/Sex: Treating RN: [birth year] (67 y.o. [name] Primary Care Abisai Coble: Saralyn Pilar Other Clinician: Referring Greysen Devino: Treating Estefana Taylor/Extender: RO BSO N, [name], [name] in Treatment: 5 Edema Assessment Assessed: [Left: No] [Right: No] [Left: Edema] [Right: :] Calf Left: Right: Point of Measurement: 32 cm From Medial Instep 37 cm Ankle Left: Right: Point of Measurement: 11 cm  From Medial Instep 24 cm Vascular Assessment Pulses: Dorsalis Pedis Palpable: [Right:Yes] Extremity colors, hair growth, and conditions: Extremity Color: [Right:Dusky] Hair Growth on Extremity: [Right:No] Temperature of Extremity: [Right:Warm] Capillary Refill: [Right:< 3 seconds] Dependent Rubor: [Right:No] Blanched when Elevated: [Right:No No] Toe Nail Assessment Left: Right: Thick: No Discolored: No Deformed: No Improper Length and Hygiene: No Electronic Signature(s) Signed: 12/25/2022 12:30:42 PM By: Midge Aver MSN RN CNS WTA Entered By: Midge Aver on 12/23/2022 04:49:43 Delaurentis, [name] ([id number]) [id number] Page 4 of 9 -------------------------------------------------------------------------------- Multi Wound Chart Details Patient Name: Date of Service: HUXTON, GLAUS. 12/23/2022 7:30 [name] Medical Record Number: [id number] Patient Account Number: [id number] Date of Birth/Sex: Treating RN: 27-Nov-1955 (67 y.o. [name] Primary Care TRUE Shackleford: Saralyn Pilar Other Clinician: Referring Hosie Sharman: Treating Lanis Storlie/Extender: RO BSO N, [name], [name] in Treatment: 5 Vital Signs Height(in): 67 Pulse(bpm): 66 Weight(lbs): 174 Blood Pressure(mmHg): 130/74 Body Mass Index(BMI): 27.2 Temperature(F): 97.7 Respiratory Rate(breaths/min): 18 [1:Photos:] [N/A:N/A] Right, Lateral Lower Leg N/A N/A Wound Location: Gradually Appeared N/A N/A Wounding Event: Atypical N/A N/A Primary Etiology: Asthma, Hypertension N/A N/A Comorbid History: 08/29/2022 N/A N/A Date Acquired: 5 N/A N/A Weeks of Treatment: Open N/A N/A Wound Status: No N/A N/A Wound Recurrence: Yes N/A N/A Clustered Wound: 11x9x0.4 N/A N/A Measurements L x W x D (cm) 77.754 N/A N/A A (cm) : rea 31.102 N/A N/A Volume (cm) : 18.20% N/A N/A % Reduction in A rea: 18.20% N/A N/A % Reduction in Volume: Full Thickness Without Exposed N/A  N/A Classification: Support Structures Medium N/A N/A Exudate A mount: Serosanguineous N/A N/A Exudate Type: red, brown N/A N/A Exudate Color: None Present (0%) N/A N/A Granulation Amount: Large (67-100%) N/A N/A Necrotic Amount: Eschar N/A N/A Necrotic Tissue: Fat Layer (Subcutaneous Tissue): Yes N/A N/A Exposed Structures: Fascia: No Tendon: No Muscle: No Joint: No Bone: No None N/A N/A Epithelialization: Treatment Notes Electronic Signature(s) Signed: 12/25/2022 12:30:42 PM By: Midge Aver MSN RN CNS WTA Entered By: Midge Aver on 12/23/2022 05:06:21 Caudill, [name] ([id number]) [id number] Page 5 of 9 -------------------------------------------------------------------------------- Multi-Disciplinary Care Plan Details Patient Name:

## 2022-12-25 NOTE — Progress Notes (Signed)
Generic) Every Other Day/30 Days Discharge Instructions: Cover with ABD pad Secured With: Tubigrip Size D, 3x10 (in/yd) Every Other Day/30 Days Services and Therapies Ankle Brachial Index (ABI) Electronic Signature(s) Signed: 12/23/2022 4:28:36 PM By: Baltazar Najjar MD Signed: 12/25/2022 12:30:42 PM By: Midge Aver MSN RN CNS WTA Entered By: Midge Aver on 12/23/2022 07:21:56 Burtch, [name] ([id number]) [id number] Page 4 of 7 -------------------------------------------------------------------------------- Problem List Details Patient Name: Date of Service: KIMSEY, DEMAREE. 12/23/2022 7:30 A M Medical Record Number: [id number] Patient Account Number: [id number] Date of Birth/Sex: Treating RN: 10/08/1955 (67 y.o. 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 12/23/2022 7:30 A M Medical Record Number: [id number] Patient Account Number: [id number] Date of Birth/Sex: Treating RN: 1956/01/15 (67 y.o. [name] Primary Care Provider: Saralyn Pilar Other Clinician: Referring Provider: Treating Provider/Extender: RO BSO N, [name], [name] in Treatment: 5 Subjective History of Present Illness (HPI) KAILEB, MONSANTO ([id number]) [id number] Page 5 of 7 11/18/2022 Mr. Matthieu Loftus is a 67 year old male with a past medical history of nerve conduction deafness that presents to the clinic for a 7 month history of nonhealing ulcers to the right lower extremity. He states these happened spontaneously and have progressively gotten worse. The more proximal wound has been present for 7 months and the most recent wound on the top of the foot/distal leg has been present for the past month. He has 5 wounds total. He visited the ED on 11/04/2022 for this issue. He was given cephalexin for which he completed. He has been using Several ointments at different times to address this issue. These ointments include Silvadene, mupirocin and clobetasol.. At times he keeps the wounds open to air. He also states he will go in the pool with the wounds exposed. For work he is on his feet for long periods of time. He does not wear compression stockings. ABIs in office were 0.8. He currently denies signs of infection. 8/28; patient presents for follow-up. He has been using Vashe wet-to-dry dressings to the wound beds. Patient's wound culture showed Pseudomonas aeruginosa sensitive to ciprofloxacin and this was sent into the pharmacy. Also recommended Keystone antibiotic ointment And this has already been ordered. He has not received this yet. He had a skin  biopsy done as well that showed fibrosis inflammation. This was nonspecific. Patient declined debridement due to chronic pain. 9/4; patient presents for follow-up. He has been using Vashe wet-to-dry dressings to the wound beds. He obtains the [facility] antibiotic ointment tomorrow. He has 1 day left of his oral antibiotics. There has been improvement in size since last clinic visit to the wound beds. He currently denies systemic signs of infection. 9/11; patient presents for follow-up. He has been using Keystone antibiotic ointment to the wound beds. He has tolerated this treatment well. Wound measurements are stable however periwound appears less irritated. 9/18;  Generic) Every Other Day/30 Days Discharge Instructions: Cover with ABD pad Secured With: Tubigrip Size D, 3x10 (in/yd) Every Other Day/30 Days Services and Therapies Ankle Brachial Index (ABI) Electronic Signature(s) Signed: 12/23/2022 4:28:36 PM By: Baltazar Najjar MD Signed: 12/25/2022 12:30:42 PM By: Midge Aver MSN RN CNS WTA Entered By: Midge Aver on 12/23/2022 07:21:56 Burtch, [name] ([id number]) [id number] Page 4 of 7 -------------------------------------------------------------------------------- Problem List Details Patient Name: Date of Service: KIMSEY, DEMAREE. 12/23/2022 7:30 A M Medical Record Number: [id number] Patient Account Number: [id number] Date of Birth/Sex: Treating RN: 10/08/1955 (67 y.o. 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 12/23/2022 7:30 A M Medical Record Number: [id number] Patient Account Number: [id number] Date of Birth/Sex: Treating RN: 1956/01/15 (67 y.o. [name] Primary Care Provider: Saralyn Pilar Other Clinician: Referring Provider: Treating Provider/Extender: RO BSO N, [name], [name] in Treatment: 5 Subjective History of Present Illness (HPI) KAILEB, MONSANTO ([id number]) [id number] Page 5 of 7 11/18/2022 Mr. Matthieu Loftus is a 67 year old male with a past medical history of nerve conduction deafness that presents to the clinic for a 7 month history of nonhealing ulcers to the right lower extremity. He states these happened spontaneously and have progressively gotten worse. The more proximal wound has been present for 7 months and the most recent wound on the top of the foot/distal leg has been present for the past month. He has 5 wounds total. He visited the ED on 11/04/2022 for this issue. He was given cephalexin for which he completed. He has been using Several ointments at different times to address this issue. These ointments include Silvadene, mupirocin and clobetasol.. At times he keeps the wounds open to air. He also states he will go in the pool with the wounds exposed. For work he is on his feet for long periods of time. He does not wear compression stockings. ABIs in office were 0.8. He currently denies signs of infection. 8/28; patient presents for follow-up. He has been using Vashe wet-to-dry dressings to the wound beds. Patient's wound culture showed Pseudomonas aeruginosa sensitive to ciprofloxacin and this was sent into the pharmacy. Also recommended Keystone antibiotic ointment And this has already been ordered. He has not received this yet. He had a skin  biopsy done as well that showed fibrosis inflammation. This was nonspecific. Patient declined debridement due to chronic pain. 9/4; patient presents for follow-up. He has been using Vashe wet-to-dry dressings to the wound beds. He obtains the [facility] antibiotic ointment tomorrow. He has 1 day left of his oral antibiotics. There has been improvement in size since last clinic visit to the wound beds. He currently denies systemic signs of infection. 9/11; patient presents for follow-up. He has been using Keystone antibiotic ointment to the wound beds. He has tolerated this treatment well. Wound measurements are stable however periwound appears less irritated. 9/18;  Generic) Every Other Day/30 Days Discharge Instructions: Cover with ABD pad Secured With: Tubigrip Size D, 3x10 (in/yd) Every Other Day/30 Days Services and Therapies Ankle Brachial Index (ABI) Electronic Signature(s) Signed: 12/23/2022 4:28:36 PM By: Baltazar Najjar MD Signed: 12/25/2022 12:30:42 PM By: Midge Aver MSN RN CNS WTA Entered By: Midge Aver on 12/23/2022 07:21:56 Burtch, [name] ([id number]) [id number] Page 4 of 7 -------------------------------------------------------------------------------- Problem List Details Patient Name: Date of Service: KIMSEY, DEMAREE. 12/23/2022 7:30 A M Medical Record Number: [id number] Patient Account Number: [id number] Date of Birth/Sex: Treating RN: 10/08/1955 (67 y.o. 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 12/23/2022 7:30 A M Medical Record Number: [id number] Patient Account Number: [id number] Date of Birth/Sex: Treating RN: 1956/01/15 (67 y.o. [name] Primary Care Provider: Saralyn Pilar Other Clinician: Referring Provider: Treating Provider/Extender: RO BSO N, [name], [name] in Treatment: 5 Subjective History of Present Illness (HPI) KAILEB, MONSANTO ([id number]) [id number] Page 5 of 7 11/18/2022 Mr. Matthieu Loftus is a 67 year old male with a past medical history of nerve conduction deafness that presents to the clinic for a 7 month history of nonhealing ulcers to the right lower extremity. He states these happened spontaneously and have progressively gotten worse. The more proximal wound has been present for 7 months and the most recent wound on the top of the foot/distal leg has been present for the past month. He has 5 wounds total. He visited the ED on 11/04/2022 for this issue. He was given cephalexin for which he completed. He has been using Several ointments at different times to address this issue. These ointments include Silvadene, mupirocin and clobetasol.. At times he keeps the wounds open to air. He also states he will go in the pool with the wounds exposed. For work he is on his feet for long periods of time. He does not wear compression stockings. ABIs in office were 0.8. He currently denies signs of infection. 8/28; patient presents for follow-up. He has been using Vashe wet-to-dry dressings to the wound beds. Patient's wound culture showed Pseudomonas aeruginosa sensitive to ciprofloxacin and this was sent into the pharmacy. Also recommended Keystone antibiotic ointment And this has already been ordered. He has not received this yet. He had a skin  biopsy done as well that showed fibrosis inflammation. This was nonspecific. Patient declined debridement due to chronic pain. 9/4; patient presents for follow-up. He has been using Vashe wet-to-dry dressings to the wound beds. He obtains the [facility] antibiotic ointment tomorrow. He has 1 day left of his oral antibiotics. There has been improvement in size since last clinic visit to the wound beds. He currently denies systemic signs of infection. 9/11; patient presents for follow-up. He has been using Keystone antibiotic ointment to the wound beds. He has tolerated this treatment well. Wound measurements are stable however periwound appears less irritated. 9/18;  Avoid Standing for Long Periods of Time. Elevate legs to the level of the heart and pump ankles as often as possible Elevate leg(s) parallel to the floor when sitting. WOUND #1: - Lower Leg Wound Laterality: Right, Lateral Cleanser: Byram Ancillary Kit - 15 Day Supply (Generic) Every Other Day/30 Days Discharge Instructions: Use supplies as instructed; Kit contains: (15) Saline Bullets; (15) 3x3 Gauze; 15 pr Gloves Cleanser: Vashe 5.8 (oz) Every Other Day/30 Days Discharge Instructions: Use vashe 5.8 (oz) as directed Prim Dressing: Hydrofera Blue Ready Transfer Foam, 4x5 (in/in) (DME) (Dispense As Written) Every Other Day/30 Days ary Discharge Instructions: Apply Hydrofera Blue Ready to wound bed as directed Secondary Dressing: ABD Pad 5x9 (in/in) (Generic) Every Other Day/30 Days Discharge Instructions: Cover with ABD pad Secured With: Tubigrip Size D, 3x10 (in/yd) Every Other Day/30 Days 1. I am continuing with the [facility] 2. I have doubled up on the Tubigrip compression and they are changing this every second day. Trying to get a little more compression to remove swelling from the area 3. I think the patient probably needs vascular studies including venous reflux and arterial  studies ABI/TBI's and arterial Dopplers. Given the extent of this I want to make sure that we have looked at everything especially correctable factors 4. He has already been biopsied once which was nondiagnostic 5. He tells me he has been dealing with this for most of this year. He had a wound on the right medial lower leg last year that healed he had a smaller area above his current wound cluster that is been present for most of the year that is healed but then in July everything opened up in this area. #6 patient still works I have asked him to continue to work his lower extremity musculature to try and move fluid up out of the lower leg. Ideally we may have to look at more aggressive compression Electronic Signature(s) Signed: 12/23/2022 4:28:36 PM By: Baltazar Najjar MD Entered By: Baltazar Najjar on 12/23/2022 05:24:12 -------------------------------------------------------------------------------- SuperBill Details Patient Name: Date of Service: [name], Jd M. 12/23/2022 Medical Record Number: [id number] Patient Account Number: [id number] Date of Birth/Sex: Treating RN: January 04, 1956 (67 y.o. 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, unspecified Hiraldo, [name] ([id number]) [id number] Page 7 of 7 Facility Procedures : CPT4 Code: [id number] Description: [phone] - DEBRIDE W/O ANES NON SELECT Modifier: Quantity: 1 Physician Procedures : CPT4 Code Description Modifier [id number] - WC PHYS LEVEL 4 - EST PT ICD-10 Diagnosis  Description I87.311 Chronic venous hypertension (idiopathic) with ulcer of right lower extremity L97.818 Non-pressure chronic ulcer of other part of right lower  leg with other specified severity Quantity: 1 Electronic Signature(s) Signed: 12/23/2022 4:28:36 PM By: Baltazar Najjar MD Entered By: Baltazar Najjar on 12/23/2022 05:24:38  TRAVONE, GEORG ([id number]) [id number] Page 1 of 7 Visit Report for 12/23/2022 Debridement Details Patient Name: Date of Service: LARENZO, CAPLES. 12/23/2022 7:30 A M Medical Record Number: [id number] Patient Account Number: [id number] Date of Birth/Sex: Treating RN: [birth year] (67 y.o. [name] Primary Care Provider: Saralyn Pilar Other Clinician: Referring Provider: Treating Provider/Extender: RO BSO Dorris Carnes, [name], [name] in Treatment: 5 Debridement Performed for Assessment: Wound #1 Right,Lateral Lower Leg Performed By: Physician [name], MD Debridement Type: Chemical/Enzymatic/Mechanical Agent Used: vashe gauze Level of Consciousness (Pre-procedure): Awake and Alert Pre-procedure Verification/Time Out Yes - 08:07 Taken: Start Time: 08:07 Pain Control: Lidocaine 4% Topical Solution Percent of Wound Bed Debrided: Instrument: Other Bleeding: Moderate Hemostasis Achieved: Pressure Procedural Pain: 5 Post Procedural Pain: 5 Response to Treatment: Procedure was tolerated well Level of Consciousness (Post- Awake and Alert procedure): Post Debridement Measurements of Total Wound Length: (cm) 11 Width: (cm) 9 Depth: (cm) 0.4 Volume: (cm) 31.102 Character of Wound/Ulcer Post Debridement: Stable Post Procedure Diagnosis Same as Pre-procedure Electronic Signature(s) Signed: 12/23/2022 4:28:36 PM By: Baltazar Najjar MD Signed: 12/25/2022 12:30:42 PM By: Midge Aver MSN RN CNS WTA Entered By: Midge Aver on 12/23/2022 05:08:41 -------------------------------------------------------------------------------- HPI Details Patient Name: Date of Service: [name], Osher M. 12/23/2022 7:30 A M Medical Record Number: [id number] Patient Account Number: [id number] Cullimore, [name] ([id number]) [id number] Page 2 of 7 Date of Birth/Sex: Treating RN: 1955/10/31 (67 y.o. [name] Primary Care  Provider: Other Clinician: Saralyn Pilar Referring Provider: Treating Provider/Extender: Chauncey Mann, [name], [name] in Treatment: 5 History of Present Illness HPI Description: 11/18/2022 Mr. Demosthenes Virnig is a 67 year old male with a past medical history of nerve conduction deafness that presents to the clinic for a 7 month history of nonhealing ulcers to the right lower extremity. He states these happened spontaneously and have progressively gotten worse. The more proximal wound has been present for 7 months and the most recent wound on the top of the foot/distal leg has been present for the past month. He has 5 wounds total. He visited the ED on 11/04/2022 for this issue. He was given cephalexin for which he completed. He has been using Several ointments at different times to address this issue. These ointments include Silvadene, mupirocin and clobetasol.. At times he keeps the wounds open to air. He also states he will go in the pool with the wounds exposed. For work he is on his feet for long periods of time. He does not wear compression stockings. ABIs in office were 0.8. He currently denies signs of infection. 8/28; patient presents for follow-up. He has been using Vashe wet-to-dry dressings to the wound beds. Patient's wound culture showed Pseudomonas aeruginosa sensitive to ciprofloxacin and this was sent into the pharmacy. Also recommended Keystone antibiotic ointment And this has already been ordered. He has not received this yet. He had a skin biopsy done as well that showed fibrosis inflammation. This was nonspecific. Patient declined debridement due to chronic pain. 9/4; patient presents for follow-up. He has been using Vashe wet-to-dry dressings to the wound beds. He obtains the [facility] antibiotic ointment tomorrow. He has 1 day left of his oral antibiotics. There has been improvement in size since last clinic visit to the wound beds. He currently denies  systemic signs of infection. 9/11; patient presents for follow-up. He has been using Keystone antibiotic ointment to the wound beds. He has tolerated this treatment well. Wound measurements

## 2022-12-30 ENCOUNTER — Other Ambulatory Visit (INDEPENDENT_AMBULATORY_CARE_PROVIDER_SITE_OTHER): Payer: Self-pay | Admitting: Internal Medicine

## 2022-12-30 ENCOUNTER — Encounter: Payer: BC Managed Care – PPO | Attending: Internal Medicine | Admitting: Internal Medicine

## 2022-12-30 DIAGNOSIS — L97818 Non-pressure chronic ulcer of other part of right lower leg with other specified severity: Secondary | ICD-10-CM

## 2022-12-30 DIAGNOSIS — L97919 Non-pressure chronic ulcer of unspecified part of right lower leg with unspecified severity: Secondary | ICD-10-CM

## 2022-12-30 DIAGNOSIS — G8929 Other chronic pain: Secondary | ICD-10-CM | POA: Diagnosis not present

## 2022-12-30 DIAGNOSIS — J45909 Unspecified asthma, uncomplicated: Secondary | ICD-10-CM | POA: Diagnosis not present

## 2022-12-30 DIAGNOSIS — I87311 Chronic venous hypertension (idiopathic) with ulcer of right lower extremity: Secondary | ICD-10-CM | POA: Diagnosis not present

## 2022-12-30 DIAGNOSIS — I10 Essential (primary) hypertension: Secondary | ICD-10-CM | POA: Insufficient documentation

## 2022-12-30 DIAGNOSIS — I872 Venous insufficiency (chronic) (peripheral): Secondary | ICD-10-CM | POA: Insufficient documentation

## 2022-12-30 DIAGNOSIS — S81801A Unspecified open wound, right lower leg, initial encounter: Secondary | ICD-10-CM | POA: Diagnosis not present

## 2023-01-01 ENCOUNTER — Ambulatory Visit (INDEPENDENT_AMBULATORY_CARE_PROVIDER_SITE_OTHER): Payer: BC Managed Care – PPO

## 2023-01-01 DIAGNOSIS — L97919 Non-pressure chronic ulcer of unspecified part of right lower leg with unspecified severity: Secondary | ICD-10-CM | POA: Diagnosis not present

## 2023-01-01 DIAGNOSIS — L97818 Non-pressure chronic ulcer of other part of right lower leg with other specified severity: Secondary | ICD-10-CM | POA: Diagnosis not present

## 2023-01-01 DIAGNOSIS — I87311 Chronic venous hypertension (idiopathic) with ulcer of right lower extremity: Secondary | ICD-10-CM | POA: Diagnosis not present

## 2023-01-01 DIAGNOSIS — I83018 Varicose veins of right lower extremity with ulcer other part of lower leg: Secondary | ICD-10-CM

## 2023-01-01 NOTE — Progress Notes (Signed)
[name], [name] ([id number]) [id number] Page 1 of 7 Visit Report for 12/30/2022 Debridement Details Patient Name: Date of Service: [name], [name]. 12/30/2022 7:45 A M Medical Record Number: [id number] Patient Account Number: [id number] Date of Birth/Sex: Treating RN: [birth year] (67 y.o. [name] Primary Care Provider: Saralyn Pilar Other Clinician: Referring Provider: Treating Provider/Extender: RO BSO Dorris Carnes, [name], [name] in Treatment: 6 Debridement Performed for Assessment: Wound #1 Right,Lateral Lower Leg Performed By: Physician [name], MD Debridement Type: Debridement Level of Consciousness (Pre-procedure): Awake and Alert Pre-procedure Verification/Time Out Yes - 08:18 Taken: Start Time: 08:18 Pain Control: Lidocaine 4% Topical Solution Percent of Wound Bed Debrided: 100% T Area Debrided (cm): otal 68.73 Tissue and other material debrided: Slough, Subcutaneous, Slough Level: Skin/Subcutaneous Tissue Debridement Description: Excisional Instrument: Curette Bleeding: Moderate Hemostasis Achieved: Pressure Procedural Pain: 7 Post Procedural Pain: 7 Response to Treatment: Procedure was tolerated well Level of Consciousness (Post- Awake and Alert procedure): Post Debridement Measurements of Total Wound Length: (cm) 10.3 Width: (cm) 8.5 Depth: (cm) 0.5 Volume: (cm) 34.381 Character of Wound/Ulcer Post Debridement: Stable Post Procedure Diagnosis Same as Pre-procedure Electronic Signature(s) Signed: 12/30/2022 3:47:50 PM By: Baltazar Najjar MD Signed: 01/01/2023 1:36:59 PM By: Midge Aver MSN RN CNS WTA Entered By: Midge Aver on 12/30/2022 05:21:39 [name], [name] ([id number]) [id number] Page 2 of 7 -------------------------------------------------------------------------------- HPI Details Patient Name: Date of Service: [name], [name] 12/30/2022 7:45 A M Medical Record  Number: [id number] Patient Account Number: [id number] Date of Birth/Sex: Treating RN: 1955/04/18 (67 y.o. [name] Primary Care Provider: Saralyn Pilar Other Clinician: Referring Provider: Treating Provider/Extender: RO BSO N, [name], [name] in Treatment: 6 History of Present Illness HPI Description: 11/18/2022 [name] is a 67 year old male with a past medical history of nerve conduction deafness that presents to the clinic for a 7 month history of nonhealing ulcers to the right lower extremity. He states these happened spontaneously and have progressively gotten worse. The more proximal wound has been present for 7 months and the most recent wound on the top of the foot/distal leg has been present for the past month. He has 5 wounds total. He visited the ED on 11/04/2022 for this issue. He was given cephalexin for which he completed. He has been using Several ointments at different times to address this issue. These ointments include Silvadene, mupirocin and clobetasol.. At times he keeps the wounds open to air. He also states he will go in the pool with the wounds exposed. For work he is on his feet for long periods of time. He does not wear compression stockings. ABIs in office were 0.8. He currently denies signs of infection. 8/28; patient presents for follow-up. He has been using Vashe wet-to-dry dressings to the wound beds. Patient's wound culture showed Pseudomonas aeruginosa sensitive to ciprofloxacin and this was sent into the pharmacy. Also recommended Keystone antibiotic ointment And this has already been ordered. He has not received this yet. He had a skin biopsy done as well that showed fibrosis inflammation. This was nonspecific. Patient declined debridement due to chronic pain. 9/4; patient presents for follow-up. He has been using Vashe wet-to-dry dressings to the wound beds. He obtains the [facility] antibiotic ointment tomorrow. He has  1 day left of his oral antibiotics. There has been improvement in size since last clinic visit to the wound beds. He currently denies systemic signs of infection. 9/11; patient presents for follow-up. He has  [name], [name] ([id number]) [id number] Page 1 of 7 Visit Report for 12/30/2022 Debridement Details Patient Name: Date of Service: [name], [name]. 12/30/2022 7:45 A M Medical Record Number: [id number] Patient Account Number: [id number] Date of Birth/Sex: Treating RN: [birth year] (67 y.o. [name] Primary Care Provider: Saralyn Pilar Other Clinician: Referring Provider: Treating Provider/Extender: RO BSO Dorris Carnes, [name], [name] in Treatment: 6 Debridement Performed for Assessment: Wound #1 Right,Lateral Lower Leg Performed By: Physician [name], MD Debridement Type: Debridement Level of Consciousness (Pre-procedure): Awake and Alert Pre-procedure Verification/Time Out Yes - 08:18 Taken: Start Time: 08:18 Pain Control: Lidocaine 4% Topical Solution Percent of Wound Bed Debrided: 100% T Area Debrided (cm): otal 68.73 Tissue and other material debrided: Slough, Subcutaneous, Slough Level: Skin/Subcutaneous Tissue Debridement Description: Excisional Instrument: Curette Bleeding: Moderate Hemostasis Achieved: Pressure Procedural Pain: 7 Post Procedural Pain: 7 Response to Treatment: Procedure was tolerated well Level of Consciousness (Post- Awake and Alert procedure): Post Debridement Measurements of Total Wound Length: (cm) 10.3 Width: (cm) 8.5 Depth: (cm) 0.5 Volume: (cm) 34.381 Character of Wound/Ulcer Post Debridement: Stable Post Procedure Diagnosis Same as Pre-procedure Electronic Signature(s) Signed: 12/30/2022 3:47:50 PM By: Baltazar Najjar MD Signed: 01/01/2023 1:36:59 PM By: Midge Aver MSN RN CNS WTA Entered By: Midge Aver on 12/30/2022 05:21:39 [name], [name] ([id number]) [id number] Page 2 of 7 -------------------------------------------------------------------------------- HPI Details Patient Name: Date of Service: [name], [name] 12/30/2022 7:45 A M Medical Record  Number: [id number] Patient Account Number: [id number] Date of Birth/Sex: Treating RN: 1955/04/18 (67 y.o. [name] Primary Care Provider: Saralyn Pilar Other Clinician: Referring Provider: Treating Provider/Extender: RO BSO N, [name], [name] in Treatment: 6 History of Present Illness HPI Description: 11/18/2022 [name] is a 67 year old male with a past medical history of nerve conduction deafness that presents to the clinic for a 7 month history of nonhealing ulcers to the right lower extremity. He states these happened spontaneously and have progressively gotten worse. The more proximal wound has been present for 7 months and the most recent wound on the top of the foot/distal leg has been present for the past month. He has 5 wounds total. He visited the ED on 11/04/2022 for this issue. He was given cephalexin for which he completed. He has been using Several ointments at different times to address this issue. These ointments include Silvadene, mupirocin and clobetasol.. At times he keeps the wounds open to air. He also states he will go in the pool with the wounds exposed. For work he is on his feet for long periods of time. He does not wear compression stockings. ABIs in office were 0.8. He currently denies signs of infection. 8/28; patient presents for follow-up. He has been using Vashe wet-to-dry dressings to the wound beds. Patient's wound culture showed Pseudomonas aeruginosa sensitive to ciprofloxacin and this was sent into the pharmacy. Also recommended Keystone antibiotic ointment And this has already been ordered. He has not received this yet. He had a skin biopsy done as well that showed fibrosis inflammation. This was nonspecific. Patient declined debridement due to chronic pain. 9/4; patient presents for follow-up. He has been using Vashe wet-to-dry dressings to the wound beds. He obtains the [facility] antibiotic ointment tomorrow. He has  1 day left of his oral antibiotics. There has been improvement in size since last clinic visit to the wound beds. He currently denies systemic signs of infection. 9/11; patient presents for follow-up. He has  precluding NSAIDs he takes acetaminophen and tramadol for pain 6. On his feet all day at work. He does not describe claudication Electronic Signature(s) Signed: 12/30/2022 3:47:50 PM By: Baltazar Najjar MD Entered By: Baltazar Najjar on 12/30/2022 05:44:28 -------------------------------------------------------------------------------- SuperBill Details Patient Name: Date of Service: [name], [name] 12/30/2022 Medical Record Number: [id number] Patient Account Number: [id number] Date of Birth/Sex: Treating RN: 30-Oct-1955 (67 y.o. 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, unspecified Facility Procedures : CPT4 Code: 78295621 Description: 11042 - DEB SUBQ TISSUE 20 SQ CM/< ICD-10 Diagnosis Description I87.311 Chronic venous hypertension (idiopathic) with ulcer of right lower extremity L97.818 Non-pressure chronic ulcer of other part of right lower leg with  other specified Modifier: severity Quantity: 1 : CPT4 Code: 30865784 Description: 11045 - DEB SUBQ TISS EA ADDL 20CM ICD-10 Diagnosis Description I87.311 Chronic venous hypertension (idiopathic) with ulcer of right lower extremity L97.818 Non-pressure chronic ulcer of other part of right lower leg with other specified Modifier: severity Quantity: 3 Physician Procedures : CPT4 Code Description Modifier [id number] 11042 - WC PHYS SUBQ TISS 20 SQ CM ICD-10 Diagnosis Description I87.311 Chronic venous hypertension (idiopathic) with ulcer of right lower extremity L97.818 Non-pressure chronic ulcer of other part of right lower  leg with other specified severity Quantity: 1 : [id number] 11045 - WC PHYS SUBQ TISS EA ADDL 20 CM ICD-10 Diagnosis Description I87.311 Chronic venous hypertension (idiopathic) with ulcer of right lower extremity L97.818 Non-pressure chronic ulcer of other part of right lower leg with other specified  severity Quantity: 3 Electronic Signature(s) Signed: 12/30/2022 3:47:50 PM By: Baltazar Najjar MD Signed: 01/01/2023 1:36:59 PM By: Midge Aver MSN RN CNS WTA Entered By: Midge Aver on 12/30/2022 05:48:43  pump ankles as often as possible Elevate leg(s) parallel to the floor when sitting. Wound Treatment Wound #1 - Lower Leg Wound Laterality: Right, Lateral Cleanser: Byram Ancillary Kit - 15 Day Supply (Generic) Every Other Day/30 Days Discharge Instructions: Use supplies as instructed; Kit contains: (15) Saline Bullets; (15) 3x3 Gauze; 15 pr Gloves Cleanser: Vashe 5.8 (oz) Every Other Day/30 Days Discharge Instructions: Use vashe 5.8 (oz) as directed Topical: Triamcinolone Acetonide Cream, 0.1%, 15 (g) tube Every Other Day/30 Days Discharge Instructions: Apply as directed by provider. Prim Dressing: Cutimed Sorbact 1.5x 2.38 (in/in) (DME) (Generic) Every Other Day/30 Days ary Discharge Instructions: A bacteria- and fungi binding wound dressing, suitable for cavities and fistulas. It is suitable as a wound filler and allows the passage of wound exudate into a secondary dressing. The dressing helps reducing odor and pain and can improve healing. Prim Dressing: Wound Hydrogel, 3 (oz), tube ary Every Other Day/30 Days Secondary Dressing: ABD Pad 5x9 (in/in) (DME) (Generic) Every Other Day/30 Days Discharge Instructions: Cover with ABD pad Secured With: Kerlix Roll Sterile or Non-Sterile 6-ply 4.5x4 (yd/yd) (DME) (Generic) Every Other Day/30 Days Discharge Instructions: Apply Kerlix as directed Secured With: Tubigrip Size D, 3x10 (in/yd) Every Other Day/30 Days Randon, [name] ([id number]) [id number] Page 4 of 7 Electronic  Signature(s) Signed: 12/30/2022 3:47:50 PM By: Baltazar Najjar MD Signed: 01/01/2023 1:36:59 PM By: Midge Aver MSN RN CNS WTA Entered By: Midge Aver on 12/30/2022 09:30:47 -------------------------------------------------------------------------------- Problem List Details Patient Name: Date of Service: [name], [name] 12/30/2022 7:45 A M Medical Record Number: [id number] Patient Account Number: [id number] Date of Birth/Sex: Treating RN: [birth year] (67 y.o. 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 12/30/2022 7:45 A M Medical Record Number: [id number] Patient Account Number: [id number] Date of Birth/Sex: Treating RN: 01-27-1956 (68 y.o. [name] Primary Care Provider: Saralyn Pilar Other Clinician: RASHAN, [name] ([id number]) [id number] Page 5 of 7 Referring Provider: Treating Provider/Extender: RO BSO N, [name], Alexander Weeks in Treatment: 6 Subjective History of Present Illness (HPI) 11/18/2022 [name] is a 67 year old male with a past medical history of nerve conduction deafness  that presents to the clinic for a 7 month history of nonhealing ulcers to the right lower extremity. He states these happened spontaneously and have progressively gotten worse. The more proximal wound has been present for 7 months and the most recent wound on the top of the foot/distal leg has been present for the past month. He has 5 wounds total. He visited the ED on 11/04/2022 for this issue. He was given cephalexin for which he completed. He has been using Several ointments at different times to address this issue. These ointments include Silvadene, mupirocin and clobetasol.. At times he keeps the wounds open to air. He also states he will go in the pool with the wounds exposed. For work he is  [name], [name] ([id number]) [id number] Page 1 of 7 Visit Report for 12/30/2022 Debridement Details Patient Name: Date of Service: [name], [name]. 12/30/2022 7:45 A M Medical Record Number: [id number] Patient Account Number: [id number] Date of Birth/Sex: Treating RN: [birth year] (67 y.o. [name] Primary Care Provider: Saralyn Pilar Other Clinician: Referring Provider: Treating Provider/Extender: RO BSO Dorris Carnes, [name], [name] in Treatment: 6 Debridement Performed for Assessment: Wound #1 Right,Lateral Lower Leg Performed By: Physician [name], MD Debridement Type: Debridement Level of Consciousness (Pre-procedure): Awake and Alert Pre-procedure Verification/Time Out Yes - 08:18 Taken: Start Time: 08:18 Pain Control: Lidocaine 4% Topical Solution Percent of Wound Bed Debrided: 100% T Area Debrided (cm): otal 68.73 Tissue and other material debrided: Slough, Subcutaneous, Slough Level: Skin/Subcutaneous Tissue Debridement Description: Excisional Instrument: Curette Bleeding: Moderate Hemostasis Achieved: Pressure Procedural Pain: 7 Post Procedural Pain: 7 Response to Treatment: Procedure was tolerated well Level of Consciousness (Post- Awake and Alert procedure): Post Debridement Measurements of Total Wound Length: (cm) 10.3 Width: (cm) 8.5 Depth: (cm) 0.5 Volume: (cm) 34.381 Character of Wound/Ulcer Post Debridement: Stable Post Procedure Diagnosis Same as Pre-procedure Electronic Signature(s) Signed: 12/30/2022 3:47:50 PM By: Baltazar Najjar MD Signed: 01/01/2023 1:36:59 PM By: Midge Aver MSN RN CNS WTA Entered By: Midge Aver on 12/30/2022 05:21:39 [name], [name] ([id number]) [id number] Page 2 of 7 -------------------------------------------------------------------------------- HPI Details Patient Name: Date of Service: [name], [name] 12/30/2022 7:45 A M Medical Record  Number: [id number] Patient Account Number: [id number] Date of Birth/Sex: Treating RN: 1955/04/18 (67 y.o. [name] Primary Care Provider: Saralyn Pilar Other Clinician: Referring Provider: Treating Provider/Extender: RO BSO N, [name], [name] in Treatment: 6 History of Present Illness HPI Description: 11/18/2022 [name] is a 67 year old male with a past medical history of nerve conduction deafness that presents to the clinic for a 7 month history of nonhealing ulcers to the right lower extremity. He states these happened spontaneously and have progressively gotten worse. The more proximal wound has been present for 7 months and the most recent wound on the top of the foot/distal leg has been present for the past month. He has 5 wounds total. He visited the ED on 11/04/2022 for this issue. He was given cephalexin for which he completed. He has been using Several ointments at different times to address this issue. These ointments include Silvadene, mupirocin and clobetasol.. At times he keeps the wounds open to air. He also states he will go in the pool with the wounds exposed. For work he is on his feet for long periods of time. He does not wear compression stockings. ABIs in office were 0.8. He currently denies signs of infection. 8/28; patient presents for follow-up. He has been using Vashe wet-to-dry dressings to the wound beds. Patient's wound culture showed Pseudomonas aeruginosa sensitive to ciprofloxacin and this was sent into the pharmacy. Also recommended Keystone antibiotic ointment And this has already been ordered. He has not received this yet. He had a skin biopsy done as well that showed fibrosis inflammation. This was nonspecific. Patient declined debridement due to chronic pain. 9/4; patient presents for follow-up. He has been using Vashe wet-to-dry dressings to the wound beds. He obtains the [facility] antibiotic ointment tomorrow. He has  1 day left of his oral antibiotics. There has been improvement in size since last clinic visit to the wound beds. He currently denies systemic signs of infection. 9/11; patient presents for follow-up. He has  [name], [name] ([id number]) [id number] Page 1 of 7 Visit Report for 12/30/2022 Debridement Details Patient Name: Date of Service: [name], [name]. 12/30/2022 7:45 A M Medical Record Number: [id number] Patient Account Number: [id number] Date of Birth/Sex: Treating RN: [birth year] (67 y.o. [name] Primary Care Provider: Saralyn Pilar Other Clinician: Referring Provider: Treating Provider/Extender: RO BSO Dorris Carnes, [name], [name] in Treatment: 6 Debridement Performed for Assessment: Wound #1 Right,Lateral Lower Leg Performed By: Physician [name], MD Debridement Type: Debridement Level of Consciousness (Pre-procedure): Awake and Alert Pre-procedure Verification/Time Out Yes - 08:18 Taken: Start Time: 08:18 Pain Control: Lidocaine 4% Topical Solution Percent of Wound Bed Debrided: 100% T Area Debrided (cm): otal 68.73 Tissue and other material debrided: Slough, Subcutaneous, Slough Level: Skin/Subcutaneous Tissue Debridement Description: Excisional Instrument: Curette Bleeding: Moderate Hemostasis Achieved: Pressure Procedural Pain: 7 Post Procedural Pain: 7 Response to Treatment: Procedure was tolerated well Level of Consciousness (Post- Awake and Alert procedure): Post Debridement Measurements of Total Wound Length: (cm) 10.3 Width: (cm) 8.5 Depth: (cm) 0.5 Volume: (cm) 34.381 Character of Wound/Ulcer Post Debridement: Stable Post Procedure Diagnosis Same as Pre-procedure Electronic Signature(s) Signed: 12/30/2022 3:47:50 PM By: Baltazar Najjar MD Signed: 01/01/2023 1:36:59 PM By: Midge Aver MSN RN CNS WTA Entered By: Midge Aver on 12/30/2022 05:21:39 [name], [name] ([id number]) [id number] Page 2 of 7 -------------------------------------------------------------------------------- HPI Details Patient Name: Date of Service: [name], [name] 12/30/2022 7:45 A M Medical Record  Number: [id number] Patient Account Number: [id number] Date of Birth/Sex: Treating RN: 1955/04/18 (67 y.o. [name] Primary Care Provider: Saralyn Pilar Other Clinician: Referring Provider: Treating Provider/Extender: RO BSO N, [name], [name] in Treatment: 6 History of Present Illness HPI Description: 11/18/2022 [name] is a 67 year old male with a past medical history of nerve conduction deafness that presents to the clinic for a 7 month history of nonhealing ulcers to the right lower extremity. He states these happened spontaneously and have progressively gotten worse. The more proximal wound has been present for 7 months and the most recent wound on the top of the foot/distal leg has been present for the past month. He has 5 wounds total. He visited the ED on 11/04/2022 for this issue. He was given cephalexin for which he completed. He has been using Several ointments at different times to address this issue. These ointments include Silvadene, mupirocin and clobetasol.. At times he keeps the wounds open to air. He also states he will go in the pool with the wounds exposed. For work he is on his feet for long periods of time. He does not wear compression stockings. ABIs in office were 0.8. He currently denies signs of infection. 8/28; patient presents for follow-up. He has been using Vashe wet-to-dry dressings to the wound beds. Patient's wound culture showed Pseudomonas aeruginosa sensitive to ciprofloxacin and this was sent into the pharmacy. Also recommended Keystone antibiotic ointment And this has already been ordered. He has not received this yet. He had a skin biopsy done as well that showed fibrosis inflammation. This was nonspecific. Patient declined debridement due to chronic pain. 9/4; patient presents for follow-up. He has been using Vashe wet-to-dry dressings to the wound beds. He obtains the [facility] antibiotic ointment tomorrow. He has  1 day left of his oral antibiotics. There has been improvement in size since last clinic visit to the wound beds. He currently denies systemic signs of infection. 9/11; patient presents for follow-up. He has

## 2023-01-01 NOTE — Progress Notes (Signed)
[name], [name] ([id number]) [id number] Page 1 of 9 Visit Report for 12/30/2022 Arrival Information Details Patient Name: Date of Service: [name], [name]. 12/30/2022 7:45 [name] Medical Record Number: [id number] Patient Account Number: [id number] Date of Birth/Sex: Treating RN: July 12, 1955 (67 y.o. [name] Primary Care Jalecia Leon: Saralyn Pilar Other Clinician: Referring Rhonda Linan: Treating Tari Lecount/Extender: RO BSO N, [name], [name] in Treatment: 6 Visit Information History Since Last Visit Added or deleted any medications: No Patient Arrived: Ambulatory Any new allergies or adverse reactions: No Arrival Time: 07:47 Has Dressing in Place as Prescribed: Yes Accompanied By: wife Pain Present Now: No Transfer Assistance: Manual Patient Identification Verified: Yes Secondary Verification Process Completed: Yes Patient Requires Transmission-Based Precautions: No Patient Has Alerts: No Electronic Signature(s) Signed: 01/01/2023 1:36:59 PM By: Midge Aver MSN RN CNS WTA Entered By: Midge Aver on 12/30/2022 04:53:46 -------------------------------------------------------------------------------- Clinic Level of Care Assessment Details Patient Name: Date of Service: [name], [name]. 12/30/2022 7:45 [name] Medical Record Number: [id number] Patient Account Number: [id number] Date of Birth/Sex: Treating RN: Dec 18, 1955 (67 y.o. [name] Primary Care Sigfredo Schreier: Saralyn Pilar Other Clinician: Referring Janeli Lewison: Treating Bryanah Sidell/Extender: RO BSO N, [name], [name] in Treatment: 6 Clinic Level of Care Assessment Items TOOL 1 Quantity Score []  - 0 Use when EandM and Procedure is performed on INITIAL visit ASSESSMENTS - Nursing Assessment / Reassessment []  - 0 General Physical Exam (combine w/ comprehensive assessment (listed just below) when performed on new pt. evals) []  - 0 Comprehensive  Assessment (HX, ROS, Risk Assessments, Wounds Hx, etc.) ASSESSMENTS - Wound and Skin Assessment / Reassessment []  - 0 Dermatologic / Skin Assessment (not related to wound area) ASSESSMENTS - Ostomy and/or Continence Assessment and Care [name], [name] ([id number]) [id number] Page 2 of 9 []  - 0 Incontinence Assessment and Management []  - 0 Ostomy Care Assessment and Management (repouching, etc.) PROCESS - Coordination of Care []  - 0 Simple Patient / Family Education for ongoing care []  - 0 Complex (extensive) Patient / Family Education for ongoing care []  - 0 Staff obtains Chiropractor, Records, T Results / Process Orders est []  - 0 Staff telephones HHA, Nursing Homes / Clarify orders / etc []  - 0 Routine Transfer to another Facility (non-emergent condition) []  - 0 Routine Hospital Admission (non-emergent condition) []  - 0 New Admissions / Manufacturing engineer / Ordering NPWT Apligraf, etc. , []  - 0 Emergency Hospital Admission (emergent condition) PROCESS - Special Needs []  - 0 Pediatric / Minor Patient Management []  - 0 Isolation Patient Management []  - 0 Hearing / Language / Visual special needs []  - 0 Assessment of Community assistance (transportation, D/C planning, etc.) []  - 0 Additional assistance / Altered mentation []  - 0 Support Surface(s) Assessment (bed, cushion, seat, etc.) INTERVENTIONS - Miscellaneous []  - 0 External ear exam []  - 0 Patient Transfer (multiple staff / Nurse, adult / Similar devices) []  - 0 Simple Staple / Suture removal (25 or less) []  - 0 Complex Staple / Suture removal (26 or more) []  - 0 Hypo/Hyperglycemic Management (do not check if billed separately) []  - 0 Ankle / Brachial Index (ABI) - do not check if billed separately Has the patient been seen at the hospital within the last three years: Yes Total Score: 0 Level Of Care: ____ Electronic Signature(s) Signed: 01/01/2023 1:36:59 PM By: Midge Aver MSN  RN CNS WTA Entered By: Midge Aver on 12/30/2022 05:45:05 -------------------------------------------------------------------------------- Encounter Discharge Information Details Patient Name:  [name], [name] ([id number]) [id number] Page 1 of 9 Visit Report for 12/30/2022 Arrival Information Details Patient Name: Date of Service: [name], [name]. 12/30/2022 7:45 [name] Medical Record Number: [id number] Patient Account Number: [id number] Date of Birth/Sex: Treating RN: July 12, 1955 (67 y.o. [name] Primary Care Jalecia Leon: Saralyn Pilar Other Clinician: Referring Rhonda Linan: Treating Tari Lecount/Extender: RO BSO N, [name], [name] in Treatment: 6 Visit Information History Since Last Visit Added or deleted any medications: No Patient Arrived: Ambulatory Any new allergies or adverse reactions: No Arrival Time: 07:47 Has Dressing in Place as Prescribed: Yes Accompanied By: wife Pain Present Now: No Transfer Assistance: Manual Patient Identification Verified: Yes Secondary Verification Process Completed: Yes Patient Requires Transmission-Based Precautions: No Patient Has Alerts: No Electronic Signature(s) Signed: 01/01/2023 1:36:59 PM By: Midge Aver MSN RN CNS WTA Entered By: Midge Aver on 12/30/2022 04:53:46 -------------------------------------------------------------------------------- Clinic Level of Care Assessment Details Patient Name: Date of Service: [name], [name]. 12/30/2022 7:45 [name] Medical Record Number: [id number] Patient Account Number: [id number] Date of Birth/Sex: Treating RN: Dec 18, 1955 (67 y.o. [name] Primary Care Sigfredo Schreier: Saralyn Pilar Other Clinician: Referring Janeli Lewison: Treating Bryanah Sidell/Extender: RO BSO N, [name], [name] in Treatment: 6 Clinic Level of Care Assessment Items TOOL 1 Quantity Score []  - 0 Use when EandM and Procedure is performed on INITIAL visit ASSESSMENTS - Nursing Assessment / Reassessment []  - 0 General Physical Exam (combine w/ comprehensive assessment (listed just below) when performed on new pt. evals) []  - 0 Comprehensive  Assessment (HX, ROS, Risk Assessments, Wounds Hx, etc.) ASSESSMENTS - Wound and Skin Assessment / Reassessment []  - 0 Dermatologic / Skin Assessment (not related to wound area) ASSESSMENTS - Ostomy and/or Continence Assessment and Care [name], [name] ([id number]) [id number] Page 2 of 9 []  - 0 Incontinence Assessment and Management []  - 0 Ostomy Care Assessment and Management (repouching, etc.) PROCESS - Coordination of Care []  - 0 Simple Patient / Family Education for ongoing care []  - 0 Complex (extensive) Patient / Family Education for ongoing care []  - 0 Staff obtains Chiropractor, Records, T Results / Process Orders est []  - 0 Staff telephones HHA, Nursing Homes / Clarify orders / etc []  - 0 Routine Transfer to another Facility (non-emergent condition) []  - 0 Routine Hospital Admission (non-emergent condition) []  - 0 New Admissions / Manufacturing engineer / Ordering NPWT Apligraf, etc. , []  - 0 Emergency Hospital Admission (emergent condition) PROCESS - Special Needs []  - 0 Pediatric / Minor Patient Management []  - 0 Isolation Patient Management []  - 0 Hearing / Language / Visual special needs []  - 0 Assessment of Community assistance (transportation, D/C planning, etc.) []  - 0 Additional assistance / Altered mentation []  - 0 Support Surface(s) Assessment (bed, cushion, seat, etc.) INTERVENTIONS - Miscellaneous []  - 0 External ear exam []  - 0 Patient Transfer (multiple staff / Nurse, adult / Similar devices) []  - 0 Simple Staple / Suture removal (25 or less) []  - 0 Complex Staple / Suture removal (26 or more) []  - 0 Hypo/Hyperglycemic Management (do not check if billed separately) []  - 0 Ankle / Brachial Index (ABI) - do not check if billed separately Has the patient been seen at the hospital within the last three years: Yes Total Score: 0 Level Of Care: ____ Electronic Signature(s) Signed: 01/01/2023 1:36:59 PM By: Midge Aver MSN  RN CNS WTA Entered By: Midge Aver on 12/30/2022 05:45:05 -------------------------------------------------------------------------------- Encounter Discharge Information Details Patient Name:  24.1% % Reduction in Volume: 5.2% Epithelialization: None Wound Description Classification: Full Thickness Without Exposed Suppor Exudate Amount: Medium Exudate Type: Serosanguineous Lepore, [name] ([id number]) Exudate Color: red, brown t Structures Foul Odor After Cleansing: No Slough/Fibrino  Yes [id number] Page 8 of 9 Wound Bed Granulation Amount: None Present (0%) Exposed Structure Necrotic Amount: Large (67-100%) Fascia Exposed: No Necrotic Quality: Eschar Fat Layer (Subcutaneous Tissue) Exposed: Yes Tendon Exposed: No Muscle Exposed: No Joint Exposed: No Bone Exposed: No Treatment Notes Wound #1 (Lower Leg) Wound Laterality: Right, Lateral Cleanser Byram Ancillary Kit - 15 Day Supply Discharge Instruction: Use supplies as instructed; Kit contains: (15) Saline Bullets; (15) 3x3 Gauze; 15 pr Gloves Vashe 5.8 (oz) Discharge Instruction: Use vashe 5.8 (oz) as directed Peri-Wound Care Topical Triamcinolone Acetonide Cream, 0.1%, 15 (g) tube Discharge Instruction: Apply as directed by Waniya Hoglund. Primary Dressing Cutimed Sorbact 1.5x 2.38 (in/in) Discharge Instruction: A bacteria- and fungi binding wound dressing, suitable for cavities and fistulas. It is suitable as a wound filler and allows the passage of wound exudate into a secondary dressing. The dressing helps reducing odor and pain and can improve healing. Wound Hydrogel, 3 (oz), tube Secondary Dressing ABD Pad 5x9 (in/in) Discharge Instruction: Cover with ABD pad Secured With Kerlix Roll Sterile or Non-Sterile 6-ply 4.5x4 (yd/yd) Discharge Instruction: Apply Kerlix as directed Tubigrip Size D, 3x10 (in/yd) Compression Wrap Compression Stockings Add-Ons Electronic Signature(s) Signed: 01/01/2023 1:36:59 PM By: Midge Aver MSN RN CNS WTA Entered By: Midge Aver on 12/30/2022 05:10:31 -------------------------------------------------------------------------------- Vitals Details Patient Name: Date of Service: [name], Braedan [name]. 12/30/2022 7:45 [name] Medical Record Number: [id number] Patient Account Number: [id number] Date of Birth/Sex: Treating RN: 05/07/1955 (67 y.o. [name] Primary Care Odyssey Vasbinder: Saralyn Pilar Other Clinician: Referring Berklee Battey: Treating  Kamaiyah Uselton/Extender: RO BSO Dorris Carnes, [name], [name] in Treatment: 6 Vital Signs [name], [name] ([id number]) [id number] Page 9 of 9 Time Taken: 07:53 Temperature (F): 97.7 Height (in): 67 Pulse (bpm): 65 Weight (lbs): 174 Respiratory Rate (breaths/min): 18 Body Mass Index (BMI): 27.2 Blood Pressure (mmHg): 134/83 Reference Range: 80 - 120 mg / dl Electronic Signature(s) Signed: 01/01/2023 1:36:59 PM By: Midge Aver MSN RN CNS WTA Entered By: Midge Aver on 12/30/2022 04:54:37  Record Number: [id number] Patient Account Number: [id number] Date of Birth/Sex: Treating RN: 03-20-1956 (67 y.o. [name] Primary Care Lashawn Orrego: Saralyn Pilar Other Clinician: Referring Rochella Benner: Treating Gaylia Kassel/Extender: RO BSO Dorris Carnes, [name], [name] in Treatment: 6 Active Inactive Necrotic Tissue Nursing Diagnoses: Knowledge deficit related to management of necrotic/devitalized tissue Goals: Patient/caregiver will verbalize understanding of reason and process for debridement of necrotic tissue Date Initiated: 11/18/2022 Target Resolution Date: 01/18/2023 Goal Status: Active Interventions: Assess patient pain level pre-, during and post procedure and prior to discharge Notes: Wound/Skin Impairment Nursing Diagnoses: Knowledge deficit related to ulceration/compromised skin integrity Goals: Patient/caregiver will verbalize understanding of skin care regimen Date Initiated: 11/18/2022 Date Inactivated: 12/30/2022 Target Resolution Date: 12/19/2022 Goal Status: Met Ulcer/skin breakdown will have a volume reduction of 30% by week 4 Date Initiated: 11/18/2022 Date Inactivated:  12/30/2022 Target Resolution Date: 12/19/2022 Goal Status: Met Ulcer/skin breakdown will have a volume reduction of 50% by week 8 Date Initiated: 11/18/2022 Target Resolution Date: 01/18/2023 Goal Status: Active Ulcer/skin breakdown will have a volume reduction of 80% by week 12 Date Initiated: 11/18/2022 Target Resolution Date: 02/18/2023 Goal Status: Active Ulcer/skin breakdown will heal within 14 weeks Date Initiated: 11/18/2022 Target Resolution Date: 03/20/2023 Goal Status: Active Interventions: Assess patient/caregiver ability to obtain necessary supplies Assess patient/caregiver ability to perform ulcer/skin care regimen upon admission and as needed Assess ulceration(s) every visit Notes: Electronic Signature(s) Signed: 12/30/2022 12:28:57 PM By: Midge Aver MSN RN CNS WTA Entered By: Midge Aver on 12/30/2022 09:28:57 Meeker, [name] ([id number]) [id number] Page 6 of 9 -------------------------------------------------------------------------------- Pain Assessment Details Patient Name: Date of Service: [name], [name] 12/30/2022 7:45 [name] Medical Record Number: [id number] Patient Account Number: [id number] Date of Birth/Sex: Treating RN: 13-Sep-1955 (67 y.o. [name] Primary Care Marci Polito: Saralyn Pilar Other Clinician: Referring Nayali Talerico: Treating Nadir Vasques/Extender: RO BSO N, [name], [name] in Treatment: 6 Active Problems Location of Pain Severity and Description of Pain Patient Has Paino No Site Locations Pain Management and Medication Current Pain Management: Electronic Signature(s) Signed: 01/01/2023 1:36:59 PM By: Midge Aver MSN RN CNS WTA Entered By: Midge Aver on 12/30/2022 04:54:55 -------------------------------------------------------------------------------- Patient/Caregiver Education Details Patient Name: Date of Service: [name] 10/2/2024andnbsp7:45 [name] Medical Record Number:  [id number] Patient Account Number: [id number] Date of Birth/Gender: Treating RN: 10/18/1955 (67 y.o. [name] Primary Care Physician: Saralyn Pilar Other Clinician: Referring Physician: Treating Physician/Extender: Chauncey Mann, [name], [name] in Treatment: 6 Mumford, [name] ([id number]) [id number] Page 7 of 9 Education Assessment Education Provided To: Patient Education Topics Provided Wound/Skin Impairment: Handouts: Caring for Your Ulcer Methods: Explain/Verbal Responses: State content correctly Electronic Signature(s) Signed: 01/01/2023 1:36:59 PM By: Midge Aver MSN RN CNS WTA Entered By: Midge Aver on 12/30/2022 09:29:28 -------------------------------------------------------------------------------- Wound Assessment Details Patient Name: Date of Service: [name], Dwayn [name]. 12/30/2022 7:45 [name] Medical Record Number: [id number] Patient Account Number: [id number] Date of Birth/Sex: Treating RN: 29-Nov-1955 (68 y.o. [name] Primary Care Tavarius Grewe: Saralyn Pilar Other Clinician: Referring Tameaka Eichhorn: Treating Raja Liska/Extender: RO BSO N, [name], [name] in Treatment: 6 Wound Status Wound Number: 1 Primary Etiology: Atypical Wound Location: Right, Lateral Lower Leg Wound Status: Open Wounding Event: Gradually Appeared Comorbid History: Asthma, Hypertension Date Acquired: 08/29/2022 Weeks Of Treatment: 6 Clustered Wound: Yes Photos Wound Measurements Length: (cm) 10.8 Width: (cm) 8.5 Depth: (cm) 0.5 Area: (cm) 72.1 Volume: (cm) 36.05 % Reduction in Area:

## 2023-01-04 LAB — VAS US ABI WITH/WO TBI
Left ABI: 1.24
Right ABI: 1.32

## 2023-01-06 ENCOUNTER — Encounter: Payer: BC Managed Care – PPO | Admitting: Internal Medicine

## 2023-01-06 DIAGNOSIS — I10 Essential (primary) hypertension: Secondary | ICD-10-CM | POA: Diagnosis not present

## 2023-01-06 DIAGNOSIS — I87311 Chronic venous hypertension (idiopathic) with ulcer of right lower extremity: Secondary | ICD-10-CM | POA: Diagnosis not present

## 2023-01-06 DIAGNOSIS — L97818 Non-pressure chronic ulcer of other part of right lower leg with other specified severity: Secondary | ICD-10-CM | POA: Diagnosis not present

## 2023-01-06 DIAGNOSIS — G8929 Other chronic pain: Secondary | ICD-10-CM | POA: Diagnosis not present

## 2023-01-06 DIAGNOSIS — J45909 Unspecified asthma, uncomplicated: Secondary | ICD-10-CM | POA: Diagnosis not present

## 2023-01-06 DIAGNOSIS — I872 Venous insufficiency (chronic) (peripheral): Secondary | ICD-10-CM | POA: Diagnosis not present

## 2023-01-06 DIAGNOSIS — S81801A Unspecified open wound, right lower leg, initial encounter: Secondary | ICD-10-CM | POA: Diagnosis not present

## 2023-01-06 NOTE — Progress Notes (Signed)
the same time if not he is going to need these [name]. Likely will need to see vascular surgery as well. We have been using [facility]  ABDs and Tubigrip x 2 10/9 the patient is gone for his vascular testing. Fortunately the arterial studies were fairly normal although his ABI was noncompressible at 1.32 on the right is TBI at 0.92 is normal. Triphasic and biphasic waveforms. On the left ABI was normal triphasic waveforms and normal TBI in terms of the venous reflux studies surprisingly benign. He had no evidence of a DVT no superficial throat thrombosis. He did have venous reflux noted in the right greater saphenous vein in the calf although this is not the drainage area where his current wounds are on the right lateral calf. There was no reflux in the small saphenous vein. We are using Sorbact and hydrogel as the primary dressing also using Keystone. 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 01/06/2023 7:45 A M Medical Record Number: [id number] Patient Account Number: [id number] Date of Birth/Sex: Treating RN: 30-Aug-1955 (67 y.o. [name] Primary Care Provider: Saralyn Pilar Other Clinician: Referring Provider: Treating Provider/Extender: [name] BSO N, [name], [name] in Treatment: 7 Constitutional Sitting or standing Blood Pressure is within target range for patient.. Pulse regular and within target range for patient.Marland Kitchen Respirations regular, non-labored and within target range.. Temperature is normal and within the target range for the patient.Marland Kitchen appears in no  distress. Cardiovascular Pedal pulses are palpable on the right. Hemosiderin. There is some pitting edema 1+ through most of his upper calf. The area around the wounds which is on the right lateral lower leg have some degree of inflammation but no palpable tenderness. I do not think there is any infection here.. Notes Wound exam; multiple wounds on the right lateral lower leg. Surrounding venous inflammation/stasis dermatitis. I although I do not think he has much edema here I think this certainly could be affected by venous hypertension/swelling in the upper part of his leg. As noted stasis dermatitis. The surface of the wound looks better Electronic Signature(s) Signed: 01/06/2023 4:50:17 PM By: Baltazar Najjar MD Entered By: Baltazar Najjar on 01/06/2023 05:25:00 -------------------------------------------------------------------------------- Physician Orders Details Patient Name: Date of Service: [name], [name] 01/06/2023 7:45 A M Medical Record Number: [id number] Patient Account Number: [id number] Date of Birth/Sex: Treating RN: 10/15/1955 (67 y.o. 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 Bathing/ Shower/ [address] [place], [place] ([id number]) [id number] Page 3 of 6 May shower with wound dressing protected with water repellent cover or cast protector. No tub bath. Anesthetic (Use 'Patient Medications' Section for Anesthetic Order  Entry) Lidocaine applied to wound bed  this is no doubt contributing to the problem Objective Constitutional Sitting or standing Blood Pressure is within target range for patient.. Pulse regular and within target range for patient.Marland Kitchen Respirations regular, non-labored and within target range.. Temperature is normal and within the target range for the patient.Marland Kitchen appears in no distress. Vitals Time Taken: 7:54 AM, Height: 67 in, Weight: 174 lbs, BMI: 27.2, Temperature: 97.7 F, Pulse: 71 bpm, Respiratory Rate: 18 breaths/min, Blood Pressure: 143/82 mmHg. Cardiovascular Pedal pulses are palpable on the right. Hemosiderin. There is some pitting edema 1+ through most of his upper calf. The area around the wounds which is on the right lateral lower leg have some degree of inflammation but no palpable tenderness. I do not think there is any infection here.. General Notes: Wound exam; multiple wounds on the right lateral lower leg. Surrounding venous inflammation/stasis dermatitis. I although I do not think he has much edema here I think this certainly could be affected by venous hypertension/swelling in the upper part of his leg. As noted stasis dermatitis. The surface of the wound looks better Integumentary (Hair, Skin) Wound #1 status is Open. Original cause of wound was Gradually Appeared. The date acquired was: 08/29/2022. The wound has been in treatment 7 weeks. The wound is located on the Right,Lateral Lower Leg. The wound measures 11.5cm length x 9cm width x 0.5cm depth; 81.289cm^2 area and 40.644cm^3 volume. There is Fat Layer (Subcutaneous Tissue) exposed. There is a medium amount of serosanguineous drainage noted. There is no granulation within the wound bed. There is a large (67-100%) amount of necrotic tissue within the wound bed including Eschar. 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 No arterial insufficiency I am therefore moving him to full Urgo K2 compression 2. Still using Sorbact hydrogel. Will use the [facility] today however he has been using this for a month without a lot of effect on these wounds. I do not think there is any evidence of infection although some degree of bacterial activity on the surface is still something to consider 3. He has venous reflux in the greater saphenous vein in the calf. This is not the vein that drains this part of the leg classically although I wonder whether there could be some connection at some level and I am not a vascular surgeon. I would like vascular to have a look at this just to tell me whether or not they feel ablation would help heal these wounds and/or prevent future wounds. 4. I do not believe there is a central venous problem here 5. No need for further antibiotics. 6. I went over this is in painstaking detail to the patient and his wife 7. No need for debridement today cannot rule that out next week although the surface looks better Electronic Signature(s) Signed: 01/06/2023 4:50:17 PM By: Baltazar Najjar MD Entered By: Baltazar Najjar on 01/06/2023 05:27:09 [name], [name] ([id number]) [id number] Page 6 of 6 -------------------------------------------------------------------------------- SuperBill Details Patient Name: Date of Service: [name], [name] 01/06/2023 Medical Record Number: [id number] Patient Account Number: [id number] Date of Birth/Sex: Treating RN: [birth year] (67 y.o. [name] Primary Care Provider: Saralyn Pilar Other Clinician: Referring Provider: Treating Provider/Extender: [name] BSO N, [name], [name] in Treatment: 7 Diagnosis Coding ICD-10 Codes Code Description I87.311 Chronic venous hypertension (idiopathic) with ulcer of right  this is no doubt contributing to the problem Objective Constitutional Sitting or standing Blood Pressure is within target range for patient.. Pulse regular and within target range for patient.Marland Kitchen Respirations regular, non-labored and within target range.. Temperature is normal and within the target range for the patient.Marland Kitchen appears in no distress. Vitals Time Taken: 7:54 AM, Height: 67 in, Weight: 174 lbs, BMI: 27.2, Temperature: 97.7 F, Pulse: 71 bpm, Respiratory Rate: 18 breaths/min, Blood Pressure: 143/82 mmHg. Cardiovascular Pedal pulses are palpable on the right. Hemosiderin. There is some pitting edema 1+ through most of his upper calf. The area around the wounds which is on the right lateral lower leg have some degree of inflammation but no palpable tenderness. I do not think there is any infection here.. General Notes: Wound exam; multiple wounds on the right lateral lower leg. Surrounding venous inflammation/stasis dermatitis. I although I do not think he has much edema here I think this certainly could be affected by venous hypertension/swelling in the upper part of his leg. As noted stasis dermatitis. The surface of the wound looks better Integumentary (Hair, Skin) Wound #1 status is Open. Original cause of wound was Gradually Appeared. The date acquired was: 08/29/2022. The wound has been in treatment 7 weeks. The wound is located on the Right,Lateral Lower Leg. The wound measures 11.5cm length x 9cm width x 0.5cm depth; 81.289cm^2 area and 40.644cm^3 volume. There is Fat Layer (Subcutaneous Tissue) exposed. There is a medium amount of serosanguineous drainage noted. There is no granulation within the wound bed. There is a large (67-100%) amount of necrotic tissue within the wound bed including Eschar. 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 No arterial insufficiency I am therefore moving him to full Urgo K2 compression 2. Still using Sorbact hydrogel. Will use the [facility] today however he has been using this for a month without a lot of effect on these wounds. I do not think there is any evidence of infection although some degree of bacterial activity on the surface is still something to consider 3. He has venous reflux in the greater saphenous vein in the calf. This is not the vein that drains this part of the leg classically although I wonder whether there could be some connection at some level and I am not a vascular surgeon. I would like vascular to have a look at this just to tell me whether or not they feel ablation would help heal these wounds and/or prevent future wounds. 4. I do not believe there is a central venous problem here 5. No need for further antibiotics. 6. I went over this is in painstaking detail to the patient and his wife 7. No need for debridement today cannot rule that out next week although the surface looks better Electronic Signature(s) Signed: 01/06/2023 4:50:17 PM By: Baltazar Najjar MD Entered By: Baltazar Najjar on 01/06/2023 05:27:09 [name], [name] ([id number]) [id number] Page 6 of 6 -------------------------------------------------------------------------------- SuperBill Details Patient Name: Date of Service: [name], [name] 01/06/2023 Medical Record Number: [id number] Patient Account Number: [id number] Date of Birth/Sex: Treating RN: [birth year] (67 y.o. [name] Primary Care Provider: Saralyn Pilar Other Clinician: Referring Provider: Treating Provider/Extender: [name] BSO N, [name], [name] in Treatment: 7 Diagnosis Coding ICD-10 Codes Code Description I87.311 Chronic venous hypertension (idiopathic) with ulcer of right  Edema Control - Lymphedema / Segmental Compressive Device / Other UrgoK2 Elevate, Exercise Daily and A void Standing for Long Periods of Time. Elevate legs to the level of the heart and pump ankles as often as possible Elevate leg(s) parallel to the floor when sitting. Wound Treatment Wound #1 - Lower Leg Wound Laterality: Right, Lateral Cleanser: Byram Ancillary Kit - 15 Day Supply (Generic) Every Other Day/30 Days Discharge Instructions: Use supplies as instructed; Kit contains: (15) Saline Bullets; (15) 3x3 Gauze; 15 pr Gloves Cleanser: Vashe 5.8 (oz) Every Other Day/30 Days Discharge Instructions: Use vashe 5.8 (oz) as directed Topical: Triamcinolone Acetonide Cream, 0.1%, 15 (g) tube Every Other Day/30 Days Discharge Instructions: Apply as directed by provider. Prim Dressing: Cutimed Sorbact 1.5x 2.38 (in/in) (Generic) Every Other Day/30 Days ary Discharge Instructions: A bacteria- and fungi binding wound dressing, suitable for cavities and fistulas. It is suitable as a wound filler and allows the passage of wound exudate into a secondary dressing. The dressing helps reducing odor and pain and can improve healing. Prim Dressing: Wound Hydrogel, 3 (oz), tube ary Every Other Day/30 Days Secondary Dressing: ABD Pad 5x9 (in/in) (Generic) Every Other Day/30 Days Discharge Instructions: Cover with ABD pad Compression Wrap: Urgo K2, two layer compression system, regular Every Other Day/30 Days Consults Vascular - Request for MD visit following ABI and Venous Reflux Study Electronic Signature(s) Signed: 01/06/2023 4:50:17 PM By: Baltazar Najjar MD Signed: 01/06/2023 4:56:06 PM By: Midge Aver MSN RN CNS WTA Entered By: Midge Aver on 01/06/2023 10:48:42 -------------------------------------------------------------------------------- Problem List Details Patient Name: Date of Service: [name], [name] 01/06/2023 7:45 A M Medical Record Number:  [id number] Patient Account Number: [id number] Date of Birth/Sex: Treating RN: December 24, 1955 (67 y.o. 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 01/06/2023 7:45 A M Medical Record Number: [id number] Patient Account Number: [id number] Date of Birth/Sex: Treating RN: 1955/05/15 (67 y.o. [name] Primary Care Provider: Saralyn Pilar Other Clinician: Referring Provider: Treating Provider/Extender: [name] BSO N, [name], [name] in Treatment: 7 Subjective History of Present Illness (HPI) 11/18/2022 [name] is a 67 year old male with a past medical history of nerve conduction deafness that presents to the clinic for a 7 month history of nonhealing ulcers to the right lower extremity. He states these happened spontaneously and have progressively gotten worse. The more proximal wound has been present for 7 months and the most recent wound on the top of the foot/distal leg has been present for  the past month. He has 5 wounds total. He visited the ED on 11/04/2022 for this issue. He was given cephalexin for which he completed. He has been using Several ointments at different times to address this issue. These ointments include Silvadene, mupirocin and clobetasol.Marland Kitchen  [name], [name] ([id number]) [id number] Page 1 of 6 Visit Report for 01/06/2023 HPI Details Patient Name: Date of Service: [name], LASKI. 01/06/2023 7:45 A M Medical Record Number: [id number] Patient Account Number: [id number] Date of Birth/Sex: Treating RN: 09-08-1955 (67 y.o. [name] Primary Care Provider: Saralyn Pilar Other Clinician: Referring Provider: Treating Provider/Extender: [name] BSO N, [name], [name] in Treatment: 7 History of Present Illness HPI Description: 11/18/2022 Mr. Tennyson Wacha is a 67 year old male with a past medical history of nerve conduction deafness that presents to the clinic for a 7 month history of nonhealing ulcers to the right lower extremity. He states these happened spontaneously and have progressively gotten worse. The more proximal wound has been present for 7 months and the most recent wound on the top of the foot/distal leg has been present for the past month. He has 5 wounds total. He visited the ED on 11/04/2022 for this issue. He was given cephalexin for which he completed. He has been using Several ointments at different times to address this issue. These ointments include Silvadene, mupirocin and clobetasol.. At times he keeps the wounds open to air. He also states he will go in the pool with the wounds exposed. For work he is on his feet for long periods of time. He does not wear compression stockings. ABIs in office were 0.8. He currently denies signs of infection. 8/28; patient presents for follow-up. He has been using Vashe wet-to-dry dressings to the wound beds. Patient's wound culture showed Pseudomonas aeruginosa sensitive to ciprofloxacin and this was sent into the pharmacy. Also recommended Keystone antibiotic ointment And this has already been ordered. He has not received this yet. He had a skin biopsy done as well that showed fibrosis inflammation. This was nonspecific. Patient  declined debridement due to chronic pain. 9/4; patient presents for follow-up. He has been using Vashe wet-to-dry dressings to the wound beds. He obtains the [facility] antibiotic ointment tomorrow. He has 1 day left of his oral antibiotics. There has been improvement in size since last clinic visit to the wound beds. He currently denies systemic signs of infection. 9/11; patient presents for follow-up. He has been using Keystone antibiotic ointment to the wound beds. He has tolerated this treatment well. Wound measurements are stable however periwound appears less irritated. 9/18; patient using Keystone and the ABD pad and Tubigrip. Unfortunately the Keystone cream coalesced over the surface of the wounds requiring an aggressive debridement to remove it. Under the [facility] the surface of the wound was not viable requiring further aggressive debridement. The patient has 4 wounds on the right lateral leg which are venous in etiology and a clustered localized area 9/25; this patient has a difficult cluster of wounds on the right lateral lower leg and ankle. Very painful. We have been using Keystone, Hydrofera Blue and Tubigrip compression. His ABI is 0.8 in this clinic. As far as I can see he is not seen vein and vascular for reflux studies. A biopsy was done in our clinic that just showed scar tissue no other identifiable pathology. 10/2; this patient has a cluster of wounds on the right lateral lower leg and ankle. He has surrounding stasis dermatitis no doubt chronic venous insufficiency with hemosiderin deposition. I have also been concerned about the difficulty feeling his posterior tibial and dorsalis pedis pulses although his ABI in the clinic was 0.8. He definitely has arterial studies on Thursday, I thought he might have venous reflux studies at  the same time if not he is going to need these [name]. Likely will need to see vascular surgery as well. We have been using [facility]  ABDs and Tubigrip x 2 10/9 the patient is gone for his vascular testing. Fortunately the arterial studies were fairly normal although his ABI was noncompressible at 1.32 on the right is TBI at 0.92 is normal. Triphasic and biphasic waveforms. On the left ABI was normal triphasic waveforms and normal TBI in terms of the venous reflux studies surprisingly benign. He had no evidence of a DVT no superficial throat thrombosis. He did have venous reflux noted in the right greater saphenous vein in the calf although this is not the drainage area where his current wounds are on the right lateral calf. There was no reflux in the small saphenous vein. We are using Sorbact and hydrogel as the primary dressing also using Keystone. 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 01/06/2023 7:45 A M Medical Record Number: [id number] Patient Account Number: [id number] Date of Birth/Sex: Treating RN: 30-Aug-1955 (67 y.o. [name] Primary Care Provider: Saralyn Pilar Other Clinician: Referring Provider: Treating Provider/Extender: [name] BSO N, [name], [name] in Treatment: 7 Constitutional Sitting or standing Blood Pressure is within target range for patient.. Pulse regular and within target range for patient.Marland Kitchen Respirations regular, non-labored and within target range.. Temperature is normal and within the target range for the patient.Marland Kitchen appears in no  distress. Cardiovascular Pedal pulses are palpable on the right. Hemosiderin. There is some pitting edema 1+ through most of his upper calf. The area around the wounds which is on the right lateral lower leg have some degree of inflammation but no palpable tenderness. I do not think there is any infection here.. Notes Wound exam; multiple wounds on the right lateral lower leg. Surrounding venous inflammation/stasis dermatitis. I although I do not think he has much edema here I think this certainly could be affected by venous hypertension/swelling in the upper part of his leg. As noted stasis dermatitis. The surface of the wound looks better Electronic Signature(s) Signed: 01/06/2023 4:50:17 PM By: Baltazar Najjar MD Entered By: Baltazar Najjar on 01/06/2023 05:25:00 -------------------------------------------------------------------------------- Physician Orders Details Patient Name: Date of Service: [name], [name] 01/06/2023 7:45 A M Medical Record Number: [id number] Patient Account Number: [id number] Date of Birth/Sex: Treating RN: 10/15/1955 (67 y.o. 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 Bathing/ Shower/ [address] [place], [place] ([id number]) [id number] Page 3 of 6 May shower with wound dressing protected with water repellent cover or cast protector. No tub bath. Anesthetic (Use 'Patient Medications' Section for Anesthetic Order  Entry) Lidocaine applied to wound bed

## 2023-01-06 NOTE — Progress Notes (Signed)
of Service: ENNIO, HOUP 01/06/2023 7:45 [name] Medical Record Number: [id number] Patient Account Number: [id number] Date of Birth/Sex: Treating RN: 02-02-1956 (67 y.o. [name] Primary Care Foster Sonnier: Saralyn Pilar Other Clinician: Referring Raha Tennison: Treating Sherre Wooton/Extender: RO BSO Dorris Carnes, [name], [name] in Treatment: 7 Compression Therapy Performed for Wound Assessment: Wound #1 Right,Lateral Lower Leg Performed By: Raj Janus, RN Compression Type: Four Regenia Skeeter ([id number]) [id number] Page 3 of 9 Post Procedure Diagnosis Same as Pre-procedure Electronic Signature(s) Signed: 01/06/2023 4:56:06 PM By: Midge Aver MSN RN CNS WTA Entered By: Midge Aver on 01/06/2023 05:35:10 -------------------------------------------------------------------------------- Encounter Discharge Information Details Patient Name: Date of Service: [name], [name]. 01/06/2023 7:45 [name] Medical Record Number: [id number] Patient Account Number: [id number] Date of Birth/Sex: Treating RN: 02-24-1956 (67 y.o. [name] Primary Care Izza Bickle: Saralyn Pilar Other Clinician: Referring Dulcy Sida: Treating Veneda Kirksey/Extender: RO BSO N, [name], [name] in Treatment: 7 Encounter Discharge Information Items Discharge Condition: Stable Ambulatory Status: Ambulatory Discharge Destination: Home Transportation: Private Auto Accompanied By: wife Schedule Follow-up Appointment: Yes Clinical Summary of Care: Electronic Signature(s) Signed: 01/06/2023 4:56:06 PM By: Midge Aver MSN RN CNS WTA Entered By: Midge Aver on 01/06/2023  05:37:46 -------------------------------------------------------------------------------- Lower Extremity Assessment Details Patient Name: Date of Service: [name], [name]. 01/06/2023 7:45 [name] Medical Record Number: [id number] Patient Account Number: [id number] Date of Birth/Sex: Treating RN: 10-15-1955 (67 y.o. [name] Primary Care Jovonda Selner: Saralyn Pilar Other Clinician: Referring Gussie Towson: Treating Jenne Sellinger/Extender: RO BSO N, [name], [name] in Treatment: 7 Edema Assessment Assessed: [Left: No] [Right: No] [Left: Edema] [Right: :] Calf Left: RightAZAD, [name] ([id number]) [id number] Page 4 of 9 Point of Measurement: 32 cm From Medial Instep 37 cm Ankle Left: Right: Point of Measurement: 11 cm From Medial Instep 29 cm Vascular Assessment Pulses: Dorsalis Pedis Palpable: [Right:Yes] Extremity colors, hair growth, and conditions: Extremity Color: [Right:Dusky] Hair Growth on Extremity: [Right:No] Temperature of Extremity: [Right:Warm] Capillary Refill: [Right:< 3 seconds] Dependent Rubor: [Right:No] Blanched when Elevated: [Right:No No] Toe Nail Assessment Left: Right: Thick: No Discolored: No Deformed: No Improper Length and Hygiene: No Electronic Signature(s) Signed: 01/06/2023 4:56:06 PM By: Midge Aver MSN RN CNS WTA Entered By: Midge Aver on 01/06/2023 05:04:10 -------------------------------------------------------------------------------- Multi Wound Chart Details Patient Name: Date of Service: [name], [name]. 01/06/2023 7:45 [name] Medical Record Number: [id number] Patient Account Number: [id number] Date of Birth/Sex: Treating RN: 04/04/1955 (67 y.o. [name] Primary Care Lorann Tani: Saralyn Pilar Other Clinician: Referring Kendel Bessey: Treating Xavion Muscat/Extender: RO BSO N, [name], [name] in Treatment: 7 Vital Signs Height(in): 67 Pulse(bpm):  71 Weight(lbs): 174 Blood Pressure(mmHg): 143/82 Body Mass Index(BMI): 27.2 Temperature(F): 97.7 Respiratory Rate(breaths/min): 18 [1:Photos:] [N/A:N/A] Right, Lateral Lower Leg N/A N/A Wound Location: [name], [name] ([id number]) [id number] Page 5 of 9 Gradually Appeared N/A N/A Wounding Event: Atypical N/A N/A Primary Etiology: Asthma, Hypertension N/A N/A Comorbid History: 08/29/2022 N/A N/A Date Acquired: 7 N/A N/A Weeks of Treatment: Open N/A N/A Wound Status: No N/A N/A Wound Recurrence: Yes N/A N/A Clustered Wound: 11.5x9x0.5 N/A N/A Measurements L x W x D (cm) 81.289 N/A N/A A (cm) : rea 40.644 N/A N/A Volume (cm) : 14.50% N/A N/A % Reduction in A rea: -6.90% N/A N/A % Reduction in Volume: Full Thickness Without Exposed N/A N/A Classification: Support Structures Medium N/A N/A Exudate A mount: Serosanguineous N/A N/A Exudate  of Service: ENNIO, HOUP 01/06/2023 7:45 [name] Medical Record Number: [id number] Patient Account Number: [id number] Date of Birth/Sex: Treating RN: 02-02-1956 (67 y.o. [name] Primary Care Foster Sonnier: Saralyn Pilar Other Clinician: Referring Raha Tennison: Treating Sherre Wooton/Extender: RO BSO Dorris Carnes, [name], [name] in Treatment: 7 Compression Therapy Performed for Wound Assessment: Wound #1 Right,Lateral Lower Leg Performed By: Raj Janus, RN Compression Type: Four Regenia Skeeter ([id number]) [id number] Page 3 of 9 Post Procedure Diagnosis Same as Pre-procedure Electronic Signature(s) Signed: 01/06/2023 4:56:06 PM By: Midge Aver MSN RN CNS WTA Entered By: Midge Aver on 01/06/2023 05:35:10 -------------------------------------------------------------------------------- Encounter Discharge Information Details Patient Name: Date of Service: [name], [name]. 01/06/2023 7:45 [name] Medical Record Number: [id number] Patient Account Number: [id number] Date of Birth/Sex: Treating RN: 02-24-1956 (67 y.o. [name] Primary Care Izza Bickle: Saralyn Pilar Other Clinician: Referring Dulcy Sida: Treating Veneda Kirksey/Extender: RO BSO N, [name], [name] in Treatment: 7 Encounter Discharge Information Items Discharge Condition: Stable Ambulatory Status: Ambulatory Discharge Destination: Home Transportation: Private Auto Accompanied By: wife Schedule Follow-up Appointment: Yes Clinical Summary of Care: Electronic Signature(s) Signed: 01/06/2023 4:56:06 PM By: Midge Aver MSN RN CNS WTA Entered By: Midge Aver on 01/06/2023  05:37:46 -------------------------------------------------------------------------------- Lower Extremity Assessment Details Patient Name: Date of Service: [name], [name]. 01/06/2023 7:45 [name] Medical Record Number: [id number] Patient Account Number: [id number] Date of Birth/Sex: Treating RN: 10-15-1955 (67 y.o. [name] Primary Care Jovonda Selner: Saralyn Pilar Other Clinician: Referring Gussie Towson: Treating Jenne Sellinger/Extender: RO BSO N, [name], [name] in Treatment: 7 Edema Assessment Assessed: [Left: No] [Right: No] [Left: Edema] [Right: :] Calf Left: RightAZAD, [name] ([id number]) [id number] Page 4 of 9 Point of Measurement: 32 cm From Medial Instep 37 cm Ankle Left: Right: Point of Measurement: 11 cm From Medial Instep 29 cm Vascular Assessment Pulses: Dorsalis Pedis Palpable: [Right:Yes] Extremity colors, hair growth, and conditions: Extremity Color: [Right:Dusky] Hair Growth on Extremity: [Right:No] Temperature of Extremity: [Right:Warm] Capillary Refill: [Right:< 3 seconds] Dependent Rubor: [Right:No] Blanched when Elevated: [Right:No No] Toe Nail Assessment Left: Right: Thick: No Discolored: No Deformed: No Improper Length and Hygiene: No Electronic Signature(s) Signed: 01/06/2023 4:56:06 PM By: Midge Aver MSN RN CNS WTA Entered By: Midge Aver on 01/06/2023 05:04:10 -------------------------------------------------------------------------------- Multi Wound Chart Details Patient Name: Date of Service: [name], [name]. 01/06/2023 7:45 [name] Medical Record Number: [id number] Patient Account Number: [id number] Date of Birth/Sex: Treating RN: 04/04/1955 (67 y.o. [name] Primary Care Lorann Tani: Saralyn Pilar Other Clinician: Referring Kendel Bessey: Treating Xavion Muscat/Extender: RO BSO N, [name], [name] in Treatment: 7 Vital Signs Height(in): 67 Pulse(bpm):  71 Weight(lbs): 174 Blood Pressure(mmHg): 143/82 Body Mass Index(BMI): 27.2 Temperature(F): 97.7 Respiratory Rate(breaths/min): 18 [1:Photos:] [N/A:N/A] Right, Lateral Lower Leg N/A N/A Wound Location: [name], [name] ([id number]) [id number] Page 5 of 9 Gradually Appeared N/A N/A Wounding Event: Atypical N/A N/A Primary Etiology: Asthma, Hypertension N/A N/A Comorbid History: 08/29/2022 N/A N/A Date Acquired: 7 N/A N/A Weeks of Treatment: Open N/A N/A Wound Status: No N/A N/A Wound Recurrence: Yes N/A N/A Clustered Wound: 11.5x9x0.5 N/A N/A Measurements L x W x D (cm) 81.289 N/A N/A A (cm) : rea 40.644 N/A N/A Volume (cm) : 14.50% N/A N/A % Reduction in A rea: -6.90% N/A N/A % Reduction in Volume: Full Thickness Without Exposed N/A N/A Classification: Support Structures Medium N/A N/A Exudate A mount: Serosanguineous N/A N/A Exudate  [name], [name] ([id number]) [id number] Page 1 of 9 Visit Report for 01/06/2023 Arrival Information Details Patient Name: Date of Service: [name], NAJI. 01/06/2023 7:45 [name] Medical Record Number: [id number] Patient Account Number: [id number] Date of Birth/Sex: Treating RN: [birth year] (67 y.o. [name] Primary Care Earlean Fidalgo: Saralyn Pilar Other Clinician: Referring Delonta Yohannes: Treating Magan Winnett/Extender: RO BSO N, [name], [name] in Treatment: 7 Visit Information History Since Last Visit Added or deleted any medications: No Patient Arrived: Ambulatory Any new allergies or adverse reactions: No Arrival Time: 07:46 Has Dressing in Place as Prescribed: Yes Accompanied By: wife Pain Present Now: No Transfer Assistance: None Patient Identification Verified: Yes Secondary Verification Process Completed: Yes Patient Requires Transmission-Based Precautions: No Patient Has Alerts: No Electronic Signature(s) Signed: 01/06/2023 4:56:06 PM By: Midge Aver MSN RN CNS WTA Entered By: Midge Aver on 01/06/2023 04:47:12 -------------------------------------------------------------------------------- Clinic Level of Care Assessment Details Patient Name: Date of Service: [name], Keshaun [name]. 01/06/2023 7:45 [name] Medical Record Number: [id number] Patient Account Number: [id number] Date of Birth/Sex: Treating RN: May 09, 1955 (67 y.o. [name] Primary Care Modena Bellemare: Saralyn Pilar Other Clinician: Referring Crimson Beer: Treating Zareena Willis/Extender: RO BSO N, [name], [name] in Treatment: 7 Clinic Level of Care Assessment Items TOOL 1 Quantity Score []  - 0 Use when EandM and Procedure is performed on INITIAL visit ASSESSMENTS - Nursing Assessment / Reassessment []  - 0 General Physical Exam (combine w/ comprehensive assessment (listed just below) when performed on new pt. evals) []  - 0 Comprehensive  Assessment (HX, ROS, Risk Assessments, Wounds Hx, etc.) ASSESSMENTS - Wound and Skin Assessment / Reassessment []  - 0 Dermatologic / Skin Assessment (not related to wound area) ASSESSMENTS - Ostomy and/or Continence Assessment and Care [name], [name] ([id number]) [id number] Page 2 of 9 []  - 0 Incontinence Assessment and Management []  - 0 Ostomy Care Assessment and Management (repouching, etc.) PROCESS - Coordination of Care []  - 0 Simple Patient / Family Education for ongoing care []  - 0 Complex (extensive) Patient / Family Education for ongoing care []  - 0 Staff obtains Chiropractor, Records, T Results / Process Orders est []  - 0 Staff telephones HHA, Nursing Homes / Clarify orders / etc []  - 0 Routine Transfer to another Facility (non-emergent condition) []  - 0 Routine Hospital Admission (non-emergent condition) []  - 0 New Admissions / Manufacturing engineer / Ordering NPWT Apligraf, etc. , []  - 0 Emergency Hospital Admission (emergent condition) PROCESS - Special Needs []  - 0 Pediatric / Minor Patient Management []  - 0 Isolation Patient Management []  - 0 Hearing / Language / Visual special needs []  - 0 Assessment of Community assistance (transportation, D/C planning, etc.) []  - 0 Additional assistance / Altered mentation []  - 0 Support Surface(s) Assessment (bed, cushion, seat, etc.) INTERVENTIONS - Miscellaneous []  - 0 External ear exam []  - 0 Patient Transfer (multiple staff / Nurse, adult / Similar devices) []  - 0 Simple Staple / Suture removal (25 or less) []  - 0 Complex Staple / Suture removal (26 or more) []  - 0 Hypo/Hyperglycemic Management (do not check if billed separately) []  - 0 Ankle / Brachial Index (ABI) - do not check if billed separately Has the patient been seen at the hospital within the last three years: Yes Total Score: 0 Level Of Care: ____ Electronic Signature(s) Signed: 01/06/2023 4:56:06 PM By: Midge Aver MSN  RN CNS WTA Entered By: Midge Aver on 01/06/2023 05:36:31 -------------------------------------------------------------------------------- Compression Therapy Details Patient Name: Date  Medical Record Number: [id number] Patient Account Number: [id number] Date of Birth/Sex: Treating RN: [birth year] (67 y.o. [name] Primary Care Sender Rueb: Saralyn Pilar  Other Clinician: Referring Drayson Dorko: Treating Cole Klugh/Extender: RO BSO N, [name], [name] in Treatment: 7 Wound Status Wound Number: 1 Primary Etiology: Atypical Wound Location: Right, Lateral Lower Leg Wound Status: Open Wounding Event: Gradually Appeared Comorbid History: Asthma, Hypertension Date Acquired: 08/29/2022 Weeks Of Treatment: 7 Clustered Wound: Yes Photos Wound Measurements Length: (cm) 11.5 Width: (cm) 9 Depth: (cm) 0.5 Area: (cm) 81.289 Volume: (cm) 40.644 % Reduction in Area: 14.5% % Reduction in Volume: -6.9% Epithelialization: None Wound Description Classification: Full Thickness Without Exposed Suppor Exudate Amount: Medium Exudate Type: Serosanguineous Exudate Color: red, brown t Structures Foul Odor After Cleansing: No Slough/Fibrino Yes Wound Bed Granulation Amount: None Present (0%) Exposed Structure Necrotic Amount: Large (67-100%) Fascia Exposed: No Necrotic Quality: Eschar Fat Layer (Subcutaneous Tissue) Exposed: Yes Tendon Exposed: No Muscle Exposed: No Joint Exposed: No Bone Exposed: No Treatment Notes Wound #1 (Lower Leg) Wound Laterality: Right, Lateral Cleanser Byram Ancillary Kit - 15 Day Supply Discharge Instruction: Use supplies as instructed; Kit contains: (15) Saline Bullets; (15) 3x3 Gauze; 15 pr Gloves Vashe 5.8 (oz) [name], [name] ([id number]) [id number] Page 9 of 9 Discharge Instruction: Use vashe 5.8 (oz) as directed Peri-Wound Care Topical Triamcinolone Acetonide Cream, 0.1%, 15 (g) tube Discharge Instruction: Apply as directed by Naw Lasala. Primary Dressing Cutimed Sorbact 1.5x 2.38 (in/in) Discharge Instruction: A bacteria- and fungi binding wound dressing, suitable for cavities and fistulas. It is suitable as a wound filler and allows the passage of wound exudate into a secondary dressing. The dressing helps reducing odor and pain and can improve healing. Wound  Hydrogel, 3 (oz), tube Secondary Dressing ABD Pad 5x9 (in/in) Discharge Instruction: Cover with ABD pad Secured With Compression Wrap Urgo K2, two layer compression system, regular Compression Stockings Add-Ons Electronic Signature(s) Signed: 01/06/2023 4:56:06 PM By: Midge Aver MSN RN CNS WTA Entered By: Midge Aver on 01/06/2023 05:02:27 -------------------------------------------------------------------------------- Vitals Details Patient Name: Date of Service: [name], Kaci [name]. 01/06/2023 7:45 [name] Medical Record Number: [id number] Patient Account Number: [id number] Date of Birth/Sex: Treating RN: [birth year] (67 y.o. [name] Primary Care Kelsie Zaborowski: Saralyn Pilar Other Clinician: Referring Weslyn Holsonback: Treating Juanita Devincent/Extender: RO BSO N, [name], [name] in Treatment: 7 Vital Signs Time Taken: 07:54 Temperature (F): 97.7 Height (in): 67 Pulse (bpm): 71 Weight (lbs): 174 Respiratory Rate (breaths/min): 18 Body Mass Index (BMI): 27.2 Blood Pressure (mmHg): 143/82 Reference Range: 80 - 120 mg / dl Electronic Signature(s) Signed: 01/06/2023 4:56:06 PM By: Midge Aver MSN RN CNS WTA Entered By: Midge Aver on 01/06/2023 04:54:27

## 2023-01-13 ENCOUNTER — Encounter: Payer: BC Managed Care – PPO | Admitting: Internal Medicine

## 2023-01-13 DIAGNOSIS — I872 Venous insufficiency (chronic) (peripheral): Secondary | ICD-10-CM | POA: Diagnosis not present

## 2023-01-13 DIAGNOSIS — L97818 Non-pressure chronic ulcer of other part of right lower leg with other specified severity: Secondary | ICD-10-CM | POA: Diagnosis not present

## 2023-01-13 DIAGNOSIS — I10 Essential (primary) hypertension: Secondary | ICD-10-CM | POA: Diagnosis not present

## 2023-01-13 DIAGNOSIS — J45909 Unspecified asthma, uncomplicated: Secondary | ICD-10-CM | POA: Diagnosis not present

## 2023-01-13 DIAGNOSIS — G8929 Other chronic pain: Secondary | ICD-10-CM | POA: Diagnosis not present

## 2023-01-13 DIAGNOSIS — I87311 Chronic venous hypertension (idiopathic) with ulcer of right lower extremity: Secondary | ICD-10-CM | POA: Diagnosis not present

## 2023-01-13 DIAGNOSIS — S81801A Unspecified open wound, right lower leg, initial encounter: Secondary | ICD-10-CM | POA: Diagnosis not present

## 2023-01-15 NOTE — Progress Notes (Signed)
MSN RN CNS WTA Entered By: Midge Aver on 01/13/2023 08:16:57 [name], [name] Schwab ([id number]) [id number] Page 8 of  9 -------------------------------------------------------------------------------- Wound Assessment Details Patient Name: Date of Service: [name], [name]. 01/13/2023 7:45 A M Medical Record Number: [id number] Patient Account Number: [id number] Date of Birth/Sex: Treating RN: 08-29-1955 (67 y.o. [name] Primary Care Jawanna Dykman: Saralyn Pilar Other Clinician: Referring Hardy Harcum: Treating Keirra Zeimet/Extender: RO BSO N, [name], [name] in Treatment: 8 Wound Status Wound Number: 1 Primary Etiology: Atypical Wound Location: Right, Lateral Lower Leg Wound Status: Open Wounding Event: Gradually Appeared Comorbid History: Asthma, Hypertension Date Acquired: 08/29/2022 Weeks Of Treatment: 8 Clustered Wound: Yes Photos Wound Measurements Length: (cm) 9.3 Width: (cm) 6.5 Depth: (cm) 0.5 Area: (cm) 47.477 Volume: (cm) 23.739 % Reduction in Area: 50% % Reduction in Volume: 37.6% Epithelialization: None Wound Description Classification: Full Thickness Without Exposed Suppor Exudate Amount: Medium Exudate Type: Serosanguineous Exudate Color: red, brown t Structures Foul Odor After Cleansing: No Slough/Fibrino Yes Wound Bed Granulation Amount: None Present (0%) Exposed Structure Necrotic Amount: Large (67-100%) Fascia Exposed: No Necrotic Quality: Eschar Fat Layer (Subcutaneous Tissue) Exposed: Yes Tendon Exposed: No Muscle Exposed: No Joint Exposed: No Bone Exposed: No Treatment Notes Wound #1 (Lower Leg) Wound Laterality: Right, Lateral Cleanser Byram Ancillary Kit - 15 Day Supply Discharge Instruction: Use supplies as instructed; Kit contains: (15) Saline Bullets; (15) 3x3 Gauze; 15 pr Gloves Vashe 5.8 (oz) [name], [name] M ([id number]) [id number] Page 9 of 9 Discharge Instruction: Use vashe 5.8 (oz) as directed Peri-Wound Care Topical Triamcinolone Acetonide Cream, 0.1%, 15 (g) tube Discharge Instruction: Apply  as directed by Mikela Senn. Primary Dressing Cutimed Sorbact 1.5x 2.38 (in/in) Discharge Instruction: A bacteria- and fungi binding wound dressing, suitable for cavities and fistulas. It is suitable as a wound filler and allows the passage of wound exudate into a secondary dressing. The dressing helps reducing odor and pain and can improve healing. Wound Hydrogel, 3 (oz), tube Secondary Dressing ABD Pad 5x9 (in/in) Discharge Instruction: Cover with ABD pad Secured With Compression Wrap Urgo K2, two layer compression system, regular Compression Stockings Add-Ons Electronic Signature(s) Signed: 01/14/2023 5:31:34 PM By: Midge Aver MSN RN CNS WTA Entered By: Midge Aver on 01/13/2023 08:06:13 -------------------------------------------------------------------------------- Vitals Details Patient Name: Date of Service: [name], [name] 01/13/2023 7:45 A M Medical Record Number: [id number] Patient Account Number: [id number] Date of Birth/Sex: Treating RN: Aug 15, 1955 (67 y.o. [name] Primary Care Haruka Kowaleski: Saralyn Pilar Other Clinician: Referring Briona Korpela: Treating Leaha Cuervo/Extender: RO BSO N, [name], [name] in Treatment: 8 Vital Signs Time Taken: 07:54 Temperature (F): 97.6 Height (in): 67 Pulse (bpm): 62 Weight (lbs): 174 Respiratory Rate (breaths/min): 18 Body Mass Index (BMI): 27.2 Blood Pressure (mmHg): 142/77 Reference Range: 80 - 120 mg / dl Electronic Signature(s) Signed: 01/14/2023 5:31:34 PM By: Midge Aver MSN RN CNS WTA Entered By: Midge Aver on 01/13/2023 07:56:45  Compression Therapy Details Patient Name: Date of Service: [name], [name]. 01/13/2023 7:45 A M Medical Record Number: [id number] Patient Account Number: [id number] Date of Birth/Sex: Treating RN: 29-Jul-1955 (67 y.o. [name] Primary Care Breniyah Romm: Saralyn Pilar Other Clinician: Referring Kyomi Hector: Treating Hala Narula/Extender: RO BSO Dorris Carnes, [name], [name] in Treatment: 8 Compression Therapy Performed for Wound Assessment: Wound #1 Right,Lateral Lower Leg Performed By: Raj Janus, RN Compression Type: Four Regenia Skeeter ([id number]) [id number] Page 3 of 9 Post Procedure Diagnosis Same as Pre-procedure Electronic Signature(s) Signed: 01/14/2023 5:31:34 PM By: Midge Aver MSN RN CNS WTA Entered By: Midge Aver on 01/13/2023 08:14:17 -------------------------------------------------------------------------------- Encounter Discharge Information Details Patient Name: Date of Service: [name], [name] 01/13/2023 7:45 A M Medical Record Number: [id number] Patient Account Number: [id number] Date of Birth/Sex: Treating RN: 07-Mar-1956 (67 y.o. [name] Primary Care Dionne Rossa: Saralyn Pilar Other Clinician: Referring Julitza Rickles: Treating Mireya Meditz/Extender: RO BSO N, [name], [name] in Treatment: 8 Encounter Discharge Information Items Post Procedure Vitals Discharge Condition: Stable Temperature (F): 97.6 Ambulatory Status: Ambulatory Pulse (bpm): 62 Discharge Destination: Home Respiratory Rate (breaths/min): 18 Transportation: Private Auto Blood Pressure (mmHg): 142/77 Accompanied By: wife Schedule Follow-up Appointment: Yes Clinical Summary of Care: Electronic  Signature(s) Signed: 01/14/2023 5:31:34 PM By: Midge Aver MSN RN CNS WTA Entered By: Midge Aver on 01/13/2023 08:17:50 -------------------------------------------------------------------------------- Lower Extremity Assessment Details Patient Name: Date of Service: [name], [name] 01/13/2023 7:45 A M Medical Record Number: [id number] Patient Account Number: [id number] Date of Birth/Sex: Treating RN: 07-19-1955 (67 y.o. [name] Primary Care Rayansh Herbst: Saralyn Pilar Other Clinician: Referring Trisa Cranor: Treating Jamani Bearce/Extender: RO BSO N, [name], [name] in Treatment: 8 Edema Assessment Assessed: [Left: No] [Right: No] Edema: [Left: N] [Right: o] Calf Left: RightCHON, STANDARD ([id number]) [id number] Page 4 of 9 Point of Measurement: 32 cm From Medial Instep 36.5 cm Ankle Left: Right: Point of Measurement: 11 cm From Medial Instep 29 cm Vascular Assessment Pulses: Dorsalis Pedis Palpable: [Right:Yes] Extremity colors, hair growth, and conditions: Extremity Color: [Right:Hyperpigmented] Hair Growth on Extremity: [Right:No] Temperature of Extremity: [Right:Warm] Capillary Refill: [Right:< 3 seconds] Dependent Rubor: [Right:No] Blanched when Elevated: [Right:No No] Toe Nail Assessment Left: Right: Thick: No Discolored: No Deformed: No Improper Length and Hygiene: No Electronic Signature(s) Signed: 01/14/2023 5:31:34 PM By: Midge Aver MSN RN CNS WTA Entered By: Midge Aver on 01/13/2023 08:07:15 -------------------------------------------------------------------------------- Multi Wound Chart Details Patient Name: Date of Service: [name], [name] 01/13/2023 7:45 A M Medical Record Number: [id number] Patient Account Number: [id number] Date of Birth/Sex: Treating RN: January 18, 1956 (67 y.o. [name] Primary Care Lenora Gomes: Saralyn Pilar Other Clinician: Referring Logun Colavito: Treating  Bakari Nikolai/Extender: RO BSO N, [name], [name] in Treatment: 8 Vital Signs Height(in): 67 Pulse(bpm): 62 Weight(lbs): 174 Blood Pressure(mmHg): 142/77 Body Mass Index(BMI): 27.2 Temperature(F): 97.6 Respiratory Rate(breaths/min): 18 [1:Photos:] [N/A:N/A] Right, Lateral Lower Leg N/A N/A Wound Location: [name], [name] ([id number]) [id number] Page 5 of 9 Gradually Appeared N/A N/A Wounding Event: Atypical N/A N/A Primary Etiology: Asthma, Hypertension N/A N/A Comorbid History: 08/29/2022 N/A N/A Date Acquired: 8 N/A N/A Weeks of Treatment: Open N/A N/A Wound Status: No N/A N/A Wound Recurrence: Yes N/A N/A Clustered Wound: 9.3x6.5x0.5 N/A N/A Measurements L x W x D (cm) 47.477 N/A N/A A (cm) : rea 23.739 N/A N/A Volume (cm) : 50.00% N/A N/A % Reduction in A rea: 37.60% N/A  MSN RN CNS WTA Entered By: Midge Aver on 01/13/2023 08:16:57 [name], [name] Schwab ([id number]) [id number] Page 8 of  9 -------------------------------------------------------------------------------- Wound Assessment Details Patient Name: Date of Service: [name], [name]. 01/13/2023 7:45 A M Medical Record Number: [id number] Patient Account Number: [id number] Date of Birth/Sex: Treating RN: 08-29-1955 (67 y.o. [name] Primary Care Jawanna Dykman: Saralyn Pilar Other Clinician: Referring Hardy Harcum: Treating Keirra Zeimet/Extender: RO BSO N, [name], [name] in Treatment: 8 Wound Status Wound Number: 1 Primary Etiology: Atypical Wound Location: Right, Lateral Lower Leg Wound Status: Open Wounding Event: Gradually Appeared Comorbid History: Asthma, Hypertension Date Acquired: 08/29/2022 Weeks Of Treatment: 8 Clustered Wound: Yes Photos Wound Measurements Length: (cm) 9.3 Width: (cm) 6.5 Depth: (cm) 0.5 Area: (cm) 47.477 Volume: (cm) 23.739 % Reduction in Area: 50% % Reduction in Volume: 37.6% Epithelialization: None Wound Description Classification: Full Thickness Without Exposed Suppor Exudate Amount: Medium Exudate Type: Serosanguineous Exudate Color: red, brown t Structures Foul Odor After Cleansing: No Slough/Fibrino Yes Wound Bed Granulation Amount: None Present (0%) Exposed Structure Necrotic Amount: Large (67-100%) Fascia Exposed: No Necrotic Quality: Eschar Fat Layer (Subcutaneous Tissue) Exposed: Yes Tendon Exposed: No Muscle Exposed: No Joint Exposed: No Bone Exposed: No Treatment Notes Wound #1 (Lower Leg) Wound Laterality: Right, Lateral Cleanser Byram Ancillary Kit - 15 Day Supply Discharge Instruction: Use supplies as instructed; Kit contains: (15) Saline Bullets; (15) 3x3 Gauze; 15 pr Gloves Vashe 5.8 (oz) [name], [name] M ([id number]) [id number] Page 9 of 9 Discharge Instruction: Use vashe 5.8 (oz) as directed Peri-Wound Care Topical Triamcinolone Acetonide Cream, 0.1%, 15 (g) tube Discharge Instruction: Apply  as directed by Mikela Senn. Primary Dressing Cutimed Sorbact 1.5x 2.38 (in/in) Discharge Instruction: A bacteria- and fungi binding wound dressing, suitable for cavities and fistulas. It is suitable as a wound filler and allows the passage of wound exudate into a secondary dressing. The dressing helps reducing odor and pain and can improve healing. Wound Hydrogel, 3 (oz), tube Secondary Dressing ABD Pad 5x9 (in/in) Discharge Instruction: Cover with ABD pad Secured With Compression Wrap Urgo K2, two layer compression system, regular Compression Stockings Add-Ons Electronic Signature(s) Signed: 01/14/2023 5:31:34 PM By: Midge Aver MSN RN CNS WTA Entered By: Midge Aver on 01/13/2023 08:06:13 -------------------------------------------------------------------------------- Vitals Details Patient Name: Date of Service: [name], [name] 01/13/2023 7:45 A M Medical Record Number: [id number] Patient Account Number: [id number] Date of Birth/Sex: Treating RN: Aug 15, 1955 (67 y.o. [name] Primary Care Haruka Kowaleski: Saralyn Pilar Other Clinician: Referring Briona Korpela: Treating Leaha Cuervo/Extender: RO BSO N, [name], [name] in Treatment: 8 Vital Signs Time Taken: 07:54 Temperature (F): 97.6 Height (in): 67 Pulse (bpm): 62 Weight (lbs): 174 Respiratory Rate (breaths/min): 18 Body Mass Index (BMI): 27.2 Blood Pressure (mmHg): 142/77 Reference Range: 80 - 120 mg / dl Electronic Signature(s) Signed: 01/14/2023 5:31:34 PM By: Midge Aver MSN RN CNS WTA Entered By: Midge Aver on 01/13/2023 07:56:45  MSN RN CNS WTA Entered By: Midge Aver on 01/13/2023 08:16:57 [name], [name] Schwab ([id number]) [id number] Page 8 of  9 -------------------------------------------------------------------------------- Wound Assessment Details Patient Name: Date of Service: [name], [name]. 01/13/2023 7:45 A M Medical Record Number: [id number] Patient Account Number: [id number] Date of Birth/Sex: Treating RN: 08-29-1955 (67 y.o. [name] Primary Care Jawanna Dykman: Saralyn Pilar Other Clinician: Referring Hardy Harcum: Treating Keirra Zeimet/Extender: RO BSO N, [name], [name] in Treatment: 8 Wound Status Wound Number: 1 Primary Etiology: Atypical Wound Location: Right, Lateral Lower Leg Wound Status: Open Wounding Event: Gradually Appeared Comorbid History: Asthma, Hypertension Date Acquired: 08/29/2022 Weeks Of Treatment: 8 Clustered Wound: Yes Photos Wound Measurements Length: (cm) 9.3 Width: (cm) 6.5 Depth: (cm) 0.5 Area: (cm) 47.477 Volume: (cm) 23.739 % Reduction in Area: 50% % Reduction in Volume: 37.6% Epithelialization: None Wound Description Classification: Full Thickness Without Exposed Suppor Exudate Amount: Medium Exudate Type: Serosanguineous Exudate Color: red, brown t Structures Foul Odor After Cleansing: No Slough/Fibrino Yes Wound Bed Granulation Amount: None Present (0%) Exposed Structure Necrotic Amount: Large (67-100%) Fascia Exposed: No Necrotic Quality: Eschar Fat Layer (Subcutaneous Tissue) Exposed: Yes Tendon Exposed: No Muscle Exposed: No Joint Exposed: No Bone Exposed: No Treatment Notes Wound #1 (Lower Leg) Wound Laterality: Right, Lateral Cleanser Byram Ancillary Kit - 15 Day Supply Discharge Instruction: Use supplies as instructed; Kit contains: (15) Saline Bullets; (15) 3x3 Gauze; 15 pr Gloves Vashe 5.8 (oz) [name], [name] M ([id number]) [id number] Page 9 of 9 Discharge Instruction: Use vashe 5.8 (oz) as directed Peri-Wound Care Topical Triamcinolone Acetonide Cream, 0.1%, 15 (g) tube Discharge Instruction: Apply  as directed by Mikela Senn. Primary Dressing Cutimed Sorbact 1.5x 2.38 (in/in) Discharge Instruction: A bacteria- and fungi binding wound dressing, suitable for cavities and fistulas. It is suitable as a wound filler and allows the passage of wound exudate into a secondary dressing. The dressing helps reducing odor and pain and can improve healing. Wound Hydrogel, 3 (oz), tube Secondary Dressing ABD Pad 5x9 (in/in) Discharge Instruction: Cover with ABD pad Secured With Compression Wrap Urgo K2, two layer compression system, regular Compression Stockings Add-Ons Electronic Signature(s) Signed: 01/14/2023 5:31:34 PM By: Midge Aver MSN RN CNS WTA Entered By: Midge Aver on 01/13/2023 08:06:13 -------------------------------------------------------------------------------- Vitals Details Patient Name: Date of Service: [name], [name] 01/13/2023 7:45 A M Medical Record Number: [id number] Patient Account Number: [id number] Date of Birth/Sex: Treating RN: Aug 15, 1955 (67 y.o. [name] Primary Care Haruka Kowaleski: Saralyn Pilar Other Clinician: Referring Briona Korpela: Treating Leaha Cuervo/Extender: RO BSO N, [name], [name] in Treatment: 8 Vital Signs Time Taken: 07:54 Temperature (F): 97.6 Height (in): 67 Pulse (bpm): 62 Weight (lbs): 174 Respiratory Rate (breaths/min): 18 Body Mass Index (BMI): 27.2 Blood Pressure (mmHg): 142/77 Reference Range: 80 - 120 mg / dl Electronic Signature(s) Signed: 01/14/2023 5:31:34 PM By: Midge Aver MSN RN CNS WTA Entered By: Midge Aver on 01/13/2023 07:56:45

## 2023-01-15 NOTE — Progress Notes (Signed)
appears in no distress. Notes Wound exam; the multiple wounds on the right lateral lower leg all look as though they have a better healthier surface. I debrided 2 small areas here with a #3 curette removing adherent surface slough there was no subcutaneous debridement. Remarkably an improvement in the erythema around the wound I think this is partially the steroid cream and partially the additional compression Electronic Signature(s) Signed: 01/13/2023 4:42:07 PM By: Baltazar Najjar MD Entered By: Baltazar Najjar on 01/13/2023 08:30:56 -------------------------------------------------------------------------------- Physician Orders Details Patient Name: Date of Service: [name], [name] 01/13/2023 7:45 A M Medical Record Number: [id number] Patient Account Number: [id number] Date of Birth/Sex: Treating RN: October 20, 1955 (67 y.o. [name] Primary Care Provider: Saralyn Pilar Other Clinician: Referring Provider: Treating Provider/Extender: RO BSO N, [name], [name] in Treatment: 8 Verbal / Phone Orders: No Diagnosis Coding Follow-up Appointments Return Appointment in 1 week. Bathing/ Shower/ Hygiene May shower with wound dressing protected with water repellent cover or cast protector. No tub bath. Anesthetic (Use 'Patient Medications' Section for Anesthetic Order Entry) Lidocaine applied to wound bed Edema Control - Lymphedema / Segmental Compressive Device / Other UrgoK2 Elevate, Exercise Daily and A void Standing for Long Periods of Time. Elevate legs to the level of the heart and pump ankles as often as possible Elevate leg(s) parallel to the floor when sitting. Wound Treatment Wound #1 - Lower Leg Wound Laterality: Right, Lateral Cleanser: Byram Ancillary Kit - 15 Day Supply (Generic) Every Other Day/30 Days Discharge Instructions:  Use supplies as instructed; Kit contains: (15) Saline Bullets; (15) 3x3 Gauze; 15 pr Gloves Cleanser: Vashe 5.8 (oz) Every Other Day/30 Days Discharge Instructions: Use vashe 5.8 (oz) as directed Topical: Triamcinolone Acetonide Cream, 0.1%, 15 (g) tube Every Other Day/30 Days Discharge Instructions: Apply as directed by provider. Prim Dressing: Cutimed Sorbact 1.5x 2.38 (in/in) (Generic) Every Other Day/30 Days ary Discharge Instructions: A bacteria- and fungi binding wound dressing, suitable for cavities and fistulas. It is suitable as a wound filler and allows the passage of wound exudate into a secondary dressing. The dressing helps reducing odor and pain and can improve healing. Prim Dressing: Wound Hydrogel, 3 (oz), tube ary Every Other Day/30 Days Secondary Dressing: ABD Pad 5x9 (in/in) (Generic) Every Other Day/30 Days Discharge Instructions: Cover with ABD pad [name] ([id number]) [id number] Page 4 of 7 Compression Wrap: Urgo K2, two layer compression system, regular Every Other Day/30 Days Electronic Signature(s) Signed: 01/13/2023 4:42:07 PM By: Baltazar Najjar MD Signed: 01/14/2023 5:31:34 PM By: Midge Aver MSN RN CNS WTA Entered By: Midge Aver on 01/13/2023 08:15:38 -------------------------------------------------------------------------------- Problem List Details Patient Name: Date of Service: [name], [name] 01/13/2023 7:45 A M Medical Record Number: [id number] Patient Account Number: [id number] Date of Birth/Sex: Treating RN: 27-Oct-1955 (67 y.o. 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, unspecified 11/18/2022 No Yes Inactive Problems Resolved Problems Electronic Signature(s) Signed: 01/13/2023 4:42:07 PM By: Baltazar Najjar MD Entered By: Baltazar Najjar on 01/13/2023 08:24:42 [name], [name] ([id number]) [id number] Page 5 of 7 -------------------------------------------------------------------------------- Progress Note Details Patient Name:  [name], [name] ([id number]) [id number] Page 1 of 7 Visit Report for 01/13/2023 Debridement Details Patient Name: Date of Service: [name], [name]. 01/13/2023 7:45 A M Medical Record Number: [id number] Patient Account Number: [id number] Date of Birth/Sex: Treating RN: [birth year] (67 y.o. [name] Primary Care Provider: Saralyn Pilar Other Clinician: Referring Provider: Treating Provider/Extender: RO BSO Dorris Carnes, [name], [name] in Treatment: 8 Debridement Performed for Assessment: Wound #1 Right,Lateral Lower Leg Performed By: Physician [name], MD Debridement Type: Debridement Level of Consciousness (Pre-procedure): Awake and Alert Pre-procedure Verification/Time Out Yes - 08:13 Taken: Start Time: 08:13 Pain Control: Lidocaine 4% T opical Solution Percent of Wound Bed Debrided: 50% T Area Debrided (cm): otal 23.73 Tissue and other material debrided: Viable, Non-Viable, Slough, Slough Level: Non-Viable Tissue Debridement Description: Selective/Open Wound Instrument: Curette Bleeding: Moderate Hemostasis Achieved: Pressure Procedural Pain: 4 Post Procedural Pain: 4 Response to Treatment: Procedure was tolerated well Level of Consciousness (Post- Awake and Alert procedure): Post Debridement Measurements of Total Wound Length: (cm) 9.3 Width: (cm) 6.5 Depth: (cm) 0.5 Volume: (cm) 23.739 Character of Wound/Ulcer Post Debridement: Stable Post Procedure Diagnosis Same as Pre-procedure Electronic Signature(s) Signed: 01/13/2023 4:42:07 PM By: Baltazar Najjar MD Signed: 01/14/2023 5:31:34 PM By: Midge Aver MSN RN CNS WTA Entered By: Midge Aver on 01/13/2023 08:15:02 Diloreto, [name] ([id number]) [id number] Page 2 of 7 -------------------------------------------------------------------------------- HPI Details Patient Name: Date of Service: [name], [name]. 01/13/2023 7:45 A  M Medical Record Number: [id number] Patient Account Number: [id number] Date of Birth/Sex: Treating RN: 22-Mar-1956 (67 y.o. [name] Primary Care Provider: Saralyn Pilar Other Clinician: Referring Provider: Treating Provider/Extender: RO BSO N, [name], [name] in Treatment: 8 History of Present Illness HPI Description: 11/18/2022 [name] is a 67 year old male with a past medical history of nerve conduction deafness that presents to the clinic for a 7 month history of nonhealing ulcers to the right lower extremity. He states these happened spontaneously and have progressively gotten worse. The more proximal wound has been present for 7 months and the most recent wound on the top of the foot/distal leg has been present for the past month. He has 5 wounds total. He visited the ED on 11/04/2022 for this issue. He was given cephalexin for which he completed. He has been using Several ointments at different times to address this issue. These ointments include Silvadene, mupirocin and clobetasol.. At times he keeps the wounds open to air. He also states he will go in the pool with the wounds exposed. For work he is on his feet for long periods of time. He does not wear compression stockings. ABIs in office were 0.8. He currently denies signs of infection. 8/28; patient presents for follow-up. He has been using Vashe wet-to-dry dressings to the wound beds. Patient's wound culture showed Pseudomonas aeruginosa sensitive to ciprofloxacin and this was sent into the pharmacy. Also recommended Keystone antibiotic ointment And this has already been ordered. He has not received this yet. He had a skin biopsy done as well that showed fibrosis inflammation. This was nonspecific. Patient declined debridement due to chronic pain. 9/4; patient presents for follow-up. He has been using Vashe wet-to-dry dressings to the wound beds. He obtains the [facility] antibiotic ointment  tomorrow. He has 1 day left of his oral antibiotics. There has been improvement in size since last clinic visit to the wound beds. He currently denies systemic signs of infection. 9/11; patient presents for  [name], [name] ([id number]) [id number] Page 1 of 7 Visit Report for 01/13/2023 Debridement Details Patient Name: Date of Service: [name], [name]. 01/13/2023 7:45 A M Medical Record Number: [id number] Patient Account Number: [id number] Date of Birth/Sex: Treating RN: [birth year] (67 y.o. [name] Primary Care Provider: Saralyn Pilar Other Clinician: Referring Provider: Treating Provider/Extender: RO BSO Dorris Carnes, [name], [name] in Treatment: 8 Debridement Performed for Assessment: Wound #1 Right,Lateral Lower Leg Performed By: Physician [name], MD Debridement Type: Debridement Level of Consciousness (Pre-procedure): Awake and Alert Pre-procedure Verification/Time Out Yes - 08:13 Taken: Start Time: 08:13 Pain Control: Lidocaine 4% T opical Solution Percent of Wound Bed Debrided: 50% T Area Debrided (cm): otal 23.73 Tissue and other material debrided: Viable, Non-Viable, Slough, Slough Level: Non-Viable Tissue Debridement Description: Selective/Open Wound Instrument: Curette Bleeding: Moderate Hemostasis Achieved: Pressure Procedural Pain: 4 Post Procedural Pain: 4 Response to Treatment: Procedure was tolerated well Level of Consciousness (Post- Awake and Alert procedure): Post Debridement Measurements of Total Wound Length: (cm) 9.3 Width: (cm) 6.5 Depth: (cm) 0.5 Volume: (cm) 23.739 Character of Wound/Ulcer Post Debridement: Stable Post Procedure Diagnosis Same as Pre-procedure Electronic Signature(s) Signed: 01/13/2023 4:42:07 PM By: Baltazar Najjar MD Signed: 01/14/2023 5:31:34 PM By: Midge Aver MSN RN CNS WTA Entered By: Midge Aver on 01/13/2023 08:15:02 Diloreto, [name] ([id number]) [id number] Page 2 of 7 -------------------------------------------------------------------------------- HPI Details Patient Name: Date of Service: [name], [name]. 01/13/2023 7:45 A  M Medical Record Number: [id number] Patient Account Number: [id number] Date of Birth/Sex: Treating RN: 22-Mar-1956 (67 y.o. [name] Primary Care Provider: Saralyn Pilar Other Clinician: Referring Provider: Treating Provider/Extender: RO BSO N, [name], [name] in Treatment: 8 History of Present Illness HPI Description: 11/18/2022 [name] is a 67 year old male with a past medical history of nerve conduction deafness that presents to the clinic for a 7 month history of nonhealing ulcers to the right lower extremity. He states these happened spontaneously and have progressively gotten worse. The more proximal wound has been present for 7 months and the most recent wound on the top of the foot/distal leg has been present for the past month. He has 5 wounds total. He visited the ED on 11/04/2022 for this issue. He was given cephalexin for which he completed. He has been using Several ointments at different times to address this issue. These ointments include Silvadene, mupirocin and clobetasol.. At times he keeps the wounds open to air. He also states he will go in the pool with the wounds exposed. For work he is on his feet for long periods of time. He does not wear compression stockings. ABIs in office were 0.8. He currently denies signs of infection. 8/28; patient presents for follow-up. He has been using Vashe wet-to-dry dressings to the wound beds. Patient's wound culture showed Pseudomonas aeruginosa sensitive to ciprofloxacin and this was sent into the pharmacy. Also recommended Keystone antibiotic ointment And this has already been ordered. He has not received this yet. He had a skin biopsy done as well that showed fibrosis inflammation. This was nonspecific. Patient declined debridement due to chronic pain. 9/4; patient presents for follow-up. He has been using Vashe wet-to-dry dressings to the wound beds. He obtains the [facility] antibiotic ointment  tomorrow. He has 1 day left of his oral antibiotics. There has been improvement in size since last clinic visit to the wound beds. He currently denies systemic signs of infection. 9/11; patient presents for  appears in no distress. Notes Wound exam; the multiple wounds on the right lateral lower leg all look as though they have a better healthier surface. I debrided 2 small areas here with a #3 curette removing adherent surface slough there was no subcutaneous debridement. Remarkably an improvement in the erythema around the wound I think this is partially the steroid cream and partially the additional compression Electronic Signature(s) Signed: 01/13/2023 4:42:07 PM By: Baltazar Najjar MD Entered By: Baltazar Najjar on 01/13/2023 08:30:56 -------------------------------------------------------------------------------- Physician Orders Details Patient Name: Date of Service: [name], [name] 01/13/2023 7:45 A M Medical Record Number: [id number] Patient Account Number: [id number] Date of Birth/Sex: Treating RN: October 20, 1955 (67 y.o. [name] Primary Care Provider: Saralyn Pilar Other Clinician: Referring Provider: Treating Provider/Extender: RO BSO N, [name], [name] in Treatment: 8 Verbal / Phone Orders: No Diagnosis Coding Follow-up Appointments Return Appointment in 1 week. Bathing/ Shower/ Hygiene May shower with wound dressing protected with water repellent cover or cast protector. No tub bath. Anesthetic (Use 'Patient Medications' Section for Anesthetic Order Entry) Lidocaine applied to wound bed Edema Control - Lymphedema / Segmental Compressive Device / Other UrgoK2 Elevate, Exercise Daily and A void Standing for Long Periods of Time. Elevate legs to the level of the heart and pump ankles as often as possible Elevate leg(s) parallel to the floor when sitting. Wound Treatment Wound #1 - Lower Leg Wound Laterality: Right, Lateral Cleanser: Byram Ancillary Kit - 15 Day Supply (Generic) Every Other Day/30 Days Discharge Instructions:  Use supplies as instructed; Kit contains: (15) Saline Bullets; (15) 3x3 Gauze; 15 pr Gloves Cleanser: Vashe 5.8 (oz) Every Other Day/30 Days Discharge Instructions: Use vashe 5.8 (oz) as directed Topical: Triamcinolone Acetonide Cream, 0.1%, 15 (g) tube Every Other Day/30 Days Discharge Instructions: Apply as directed by provider. Prim Dressing: Cutimed Sorbact 1.5x 2.38 (in/in) (Generic) Every Other Day/30 Days ary Discharge Instructions: A bacteria- and fungi binding wound dressing, suitable for cavities and fistulas. It is suitable as a wound filler and allows the passage of wound exudate into a secondary dressing. The dressing helps reducing odor and pain and can improve healing. Prim Dressing: Wound Hydrogel, 3 (oz), tube ary Every Other Day/30 Days Secondary Dressing: ABD Pad 5x9 (in/in) (Generic) Every Other Day/30 Days Discharge Instructions: Cover with ABD pad [name] ([id number]) [id number] Page 4 of 7 Compression Wrap: Urgo K2, two layer compression system, regular Every Other Day/30 Days Electronic Signature(s) Signed: 01/13/2023 4:42:07 PM By: Baltazar Najjar MD Signed: 01/14/2023 5:31:34 PM By: Midge Aver MSN RN CNS WTA Entered By: Midge Aver on 01/13/2023 08:15:38 -------------------------------------------------------------------------------- Problem List Details Patient Name: Date of Service: [name], [name] 01/13/2023 7:45 A M Medical Record Number: [id number] Patient Account Number: [id number] Date of Birth/Sex: Treating RN: 27-Oct-1955 (67 y.o. 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 Notes Wound exam; the multiple wounds on the right lateral lower leg all look as though they have a better healthier surface. I debrided 2 small areas here with a #3 curette removing adherent surface slough there was no subcutaneous debridement. Remarkably an improvement in the erythema around the wound I think this is partially the steroid cream and partially the additional compression Electronic Signature(s) Signed: 01/13/2023 4:42:07 PM By: Baltazar Najjar MD Entered By: Baltazar Najjar on 01/13/2023 08:30:56 -------------------------------------------------------------------------------- Physician Orders Details Patient Name: Date of Service: [name], [name] 01/13/2023 7:45 A M Medical Record Number: [id number] Patient Account Number: [id number] Date of Birth/Sex: Treating RN: October 20, 1955 (67 y.o. [name] Primary Care Provider: Saralyn Pilar Other Clinician: Referring Provider: Treating Provider/Extender: RO BSO N, [name], [name] in Treatment: 8 Verbal / Phone Orders: No Diagnosis Coding Follow-up Appointments Return Appointment in 1 week. Bathing/ Shower/ Hygiene May shower with wound dressing protected with water repellent cover or cast protector. No tub bath. Anesthetic (Use 'Patient Medications' Section for Anesthetic Order Entry) Lidocaine applied to wound bed Edema Control - Lymphedema / Segmental Compressive Device / Other UrgoK2 Elevate, Exercise Daily and A void Standing for Long Periods of Time. Elevate legs to the level of the heart and pump ankles as often as possible Elevate leg(s) parallel to the floor when sitting. Wound Treatment Wound #1 - Lower Leg Wound Laterality: Right, Lateral Cleanser: Byram Ancillary Kit - 15 Day Supply (Generic) Every Other Day/30 Days Discharge Instructions:  Use supplies as instructed; Kit contains: (15) Saline Bullets; (15) 3x3 Gauze; 15 pr Gloves Cleanser: Vashe 5.8 (oz) Every Other Day/30 Days Discharge Instructions: Use vashe 5.8 (oz) as directed Topical: Triamcinolone Acetonide Cream, 0.1%, 15 (g) tube Every Other Day/30 Days Discharge Instructions: Apply as directed by provider. Prim Dressing: Cutimed Sorbact 1.5x 2.38 (in/in) (Generic) Every Other Day/30 Days ary Discharge Instructions: A bacteria- and fungi binding wound dressing, suitable for cavities and fistulas. It is suitable as a wound filler and allows the passage of wound exudate into a secondary dressing. The dressing helps reducing odor and pain and can improve healing. Prim Dressing: Wound Hydrogel, 3 (oz), tube ary Every Other Day/30 Days Secondary Dressing: ABD Pad 5x9 (in/in) (Generic) Every Other Day/30 Days Discharge Instructions: Cover with ABD pad [name] ([id number]) [id number] Page 4 of 7 Compression Wrap: Urgo K2, two layer compression system, regular Every Other Day/30 Days Electronic Signature(s) Signed: 01/13/2023 4:42:07 PM By: Baltazar Najjar MD Signed: 01/14/2023 5:31:34 PM By: Midge Aver MSN RN CNS WTA Entered By: Midge Aver on 01/13/2023 08:15:38 -------------------------------------------------------------------------------- Problem List Details Patient Name: Date of Service: [name], [name] 01/13/2023 7:45 A M Medical Record Number: [id number] Patient Account Number: [id number] Date of Birth/Sex: Treating RN: 27-Oct-1955 (67 y.o. 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 01/13/2023 7:45 A M Medical Record Number: [id number] Patient Account Number: [id number] Date of Birth/Sex: Treating RN: 1955/12/07 (67 y.o. [name] Primary Care Provider: Saralyn Pilar Other Clinician: Referring Provider: Treating Provider/Extender: RO BSO N, [name], [name] in Treatment: 8 Subjective History of Present Illness (HPI) 11/18/2022 [name] is a 67 year old male with a past medical history of nerve conduction deafness that presents to the clinic for a 7 month history of nonhealing ulcers to the right lower extremity. He states these happened spontaneously and have progressively gotten worse. The more proximal wound has been present for 7 months and the most recent wound on the top of the foot/distal leg has been present for the past month. He has 5 wounds total. He visited the ED on 11/04/2022 for this issue. He was given cephalexin for which he completed. He has been using Several ointments at different times to address this issue. These ointments include Silvadene, mupirocin and clobetasol.. At times he keeps the wounds open to air. He also states he will go in the pool with the wounds exposed. For work he is on his feet for long periods of time. He does not wear compression stockings. ABIs in office were 0.8. He currently denies signs of infection. 8/28;  patient presents for follow-up. He has been using Vashe wet-to-dry dressings to the wound beds. Patient's wound culture showed Pseudomonas aeruginosa sensitive to ciprofloxacin and this was sent into the pharmacy. Also recommended Keystone antibiotic ointment And this has already been ordered. He has not received this yet. He had a skin biopsy done as well that showed fibrosis inflammation. This was nonspecific. Patient declined debridement due to chronic pain. 9/4; patient presents for follow-up. He has been using Vashe wet-to-dry dressings to the wound beds. He obtains the [facility] antibiotic ointment tomorrow. He has 1 day left of his oral antibiotics. There has been improvement in size since last clinic visit to the wound beds. He currently denies systemic signs of infection. 9/11; patient presents for follow-up. He has been using Keystone antibiotic ointment to the wound beds. He has tolerated this treatment well. Wound measurements are stable however periwound appears less irritated. 9/18; patient using Keystone and the ABD pad and Tubigrip. Unfortunately the Keystone cream coalesced over the surface of the wounds requiring an aggressive debridement to remove it. Under the [facility] the surface of the wound was not viable requiring further aggressive debridement. The patient has 4 wounds on the right lateral leg which are venous in etiology and a clustered localized area 9/25; this patient has a difficult cluster of wounds on the right lateral lower leg and ankle. Very painful. We have been using Keystone, Hydrofera Blue and Tubigrip compression. His ABI is 0.8 in this clinic. As far as I can see he is not seen vein and vascular for reflux studies. A biopsy was done in our clinic that just showed scar tissue no other identifiable pathology. 10/2; this patient has a cluster of wounds on the right lateral lower leg and ankle. He has surrounding stasis dermatitis no doubt chronic venous  insufficiency with hemosiderin deposition. I have also been concerned about the difficulty feeling his posterior tibial and dorsalis pedis pulses although his ABI in the clinic was 0.8. He definitely has arterial studies on Thursday, I thought he might have venous reflux studies at the same time if not he is going to need these [name]. Likely will need to  follow-up. He has been using Keystone antibiotic ointment to the wound beds. He has tolerated this treatment well. Wound measurements are stable however periwound appears less irritated. 9/18; patient using Keystone and the ABD pad and Tubigrip. Unfortunately the Keystone cream coalesced over the surface of the wounds requiring an aggressive debridement to remove it. Under the [facility] the surface of the wound was not viable requiring further aggressive debridement. The patient has 4 wounds on the right lateral leg which are venous in etiology and a clustered localized area 9/25; this patient has a difficult cluster of wounds on the right lateral lower leg and ankle. Very painful. We have been using Keystone, Hydrofera Blue and Tubigrip compression. His ABI is 0.8 in this clinic. As far as I can see he is not seen vein and vascular for reflux studies. A biopsy was done in our clinic that just showed scar tissue no other identifiable pathology. 10/2; this patient has a cluster of wounds on the right lateral lower leg and ankle. He has surrounding stasis dermatitis no doubt chronic venous insufficiency with hemosiderin deposition. I have also been concerned about the difficulty feeling his posterior tibial and dorsalis pedis pulses although his ABI in the clinic was 0.8. He definitely has arterial studies on Thursday, I thought he might have venous reflux studies at the same time if not he is going to need these [name]. Likely will need to see vascular surgery as well. We have been using [facility] ABDs and Tubigrip x 2 10/9 the patient is gone for his vascular testing. Fortunately the arterial studies were fairly normal although his ABI was noncompressible at 1.32 on the right is TBI  at 0.92 is normal. Triphasic and biphasic waveforms. On the left ABI was normal triphasic waveforms and normal TBI in terms of the venous reflux studies surprisingly benign. He had no evidence of a DVT no superficial throat thrombosis. He did have venous reflux noted in the right greater saphenous vein in the calf although this is not the drainage area where his current wounds are on the right lateral calf. There was no reflux in the small saphenous vein. We are using Sorbact and hydrogel as the primary dressing also using Keystone. 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 Wounds are better this week and he tolerated the Urgo K2 compression well set it was comfortable and had no issues with it. Wound surfaces are better and there is less surrounding erythema around this cluster of difficult wounds on the right lateral ankle and lower leg Electronic Signature(s) Signed: 01/13/2023 4:42:07 PM By: Baltazar Najjar MD Entered By: Baltazar Najjar on 01/13/2023 08:29:58 -------------------------------------------------------------------------------- Physical Exam Details Patient Name: Date of Service: [name], Ezekiah M. 01/13/2023 7:45 A M Medical Record Number: [id number] Patient Account Number: [id number] Date of Birth/Sex: Treating RN: 1955/12/02 (67 y.o. [name] Primary Care Provider: Saralyn Pilar Other Clinician: Referring Provider: Treating Provider/Extender: Chauncey Mann, [name] Merrik, [place], [place] ([id number]) [id number] Page 3 of 7 Weeks in Treatment: 8 Constitutional Sitting or standing Blood Pressure is within target range for patient.. Pulse regular and within target range for patient.Marland Kitchen Respirations regular,  non-labored and within target range.. Temperature is normal and within the target range for the patient.Marland Kitchen

## 2023-01-20 ENCOUNTER — Encounter: Payer: BC Managed Care – PPO | Admitting: Internal Medicine

## 2023-01-20 DIAGNOSIS — S81801A Unspecified open wound, right lower leg, initial encounter: Secondary | ICD-10-CM | POA: Diagnosis not present

## 2023-01-20 DIAGNOSIS — I872 Venous insufficiency (chronic) (peripheral): Secondary | ICD-10-CM | POA: Diagnosis not present

## 2023-01-20 DIAGNOSIS — L97818 Non-pressure chronic ulcer of other part of right lower leg with other specified severity: Secondary | ICD-10-CM | POA: Diagnosis not present

## 2023-01-20 DIAGNOSIS — I10 Essential (primary) hypertension: Secondary | ICD-10-CM | POA: Diagnosis not present

## 2023-01-20 DIAGNOSIS — I87311 Chronic venous hypertension (idiopathic) with ulcer of right lower extremity: Secondary | ICD-10-CM | POA: Diagnosis not present

## 2023-01-20 DIAGNOSIS — J45909 Unspecified asthma, uncomplicated: Secondary | ICD-10-CM | POA: Diagnosis not present

## 2023-01-20 DIAGNOSIS — G8929 Other chronic pain: Secondary | ICD-10-CM | POA: Diagnosis not present

## 2023-01-21 NOTE — Progress Notes (Signed)
November 5 reany potential benefit from a greater saphenous vein ablation. #5 if this surface is no better next week we may have to move onto something else under compression. Question [employer]of[employer] Electronic Signature(s) Signed: 01/21/2023 3:33:32 PM By: Baltazar Najjar MD Entered By: Baltazar Najjar on 01/20/2023 08:36:02 -------------------------------------------------------------------------------- SuperBill Details Patient Name: Date of Service: [name], [name] 01/20/2023 Medical Record Number: [id number] Patient Account Number: [id number] Date of Birth/Sex: Treating RN: 1955-11-25 (67 y.o. 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, unspecified Facility Procedures : CPT4 Code: 84696295 Description: 11042 - DEB SUBQ TISSUE 20 SQ CM/< ICD-10 Diagnosis Description I87.311 Chronic venous hypertension (idiopathic) with ulcer of right lower extremity L97.818 Non-pressure chronic ulcer of other part of right lower leg with other specified Modifier: severity Quantity: 1 : CPT4 Code: 28413244 Description: 11045 - DEB SUBQ TISS EA ADDL 20CM ICD-10 Diagnosis Description L97.818 Non-pressure chronic ulcer of other part of right lower leg with other specified Modifier: severity Quantity: 1 Physician Procedures : CPT4 Code Description Modifier [id number] 11042 - WC PHYS SUBQ TISS 20 SQ CM  ICD-10 Diagnosis Description I87.311 Chronic venous hypertension (idiopathic) with ulcer of right lower extremity L97.818 Non-pressure chronic ulcer of other part of right lower  leg with other specified severity Quantity: 1 : [id number] 11045 - WC PHYS SUBQ TISS EA ADDL 20 CM ICD-10 Diagnosis Description L97.818 Non-pressure chronic ulcer of other part of right lower leg with other specified severity Quantity: 1 Electronic Signature(s) Signed: 01/21/2023 3:33:32 PM By: Baltazar Najjar MD Entered By: Baltazar Najjar on 01/20/2023 47:42:59  GAROLD, BROERS ([id number]) [id number] Page 1 of 7 Visit Report for 01/20/2023 Debridement Details Patient Name: Date of Service: [name], [name]. 01/20/2023 7:45 A M Medical Record Number: [id number] Patient Account Number: [id number] Date of Birth/Sex: Treating RN: [birth year] (67 y.o. [name] Primary Care Provider: Saralyn Pilar Other Clinician: Referring Provider: Treating Provider/Extender: RO BSO Dorris Carnes, [name], [name] in Treatment: 9 Debridement Performed for Assessment: Wound #1 Right,Lateral Lower Leg Performed By: Physician [name], MD Debridement Type: Debridement Level of Consciousness (Pre-procedure): Awake and Alert Pre-procedure Verification/Time Out Yes - 08:20 Taken: Start Time: 08:20 Pain Control: Lidocaine 4% T opical Solution Percent of Wound Bed Debrided: 100% T Area Debrided (cm): otal 36.74 Tissue and other material debrided: Viable, Non-Viable, Slough, Subcutaneous, Slough Level: Skin/Subcutaneous Tissue Debridement Description: Excisional Instrument: Curette Bleeding: Moderate Hemostasis Achieved: Pressure Procedural Pain: 5 Post Procedural Pain: 6 Response to Treatment: Procedure was tolerated well Level of Consciousness (Post- Awake and Alert procedure): Post Debridement Measurements of Total Wound Length: (cm) 9 Width: (cm) 5.2 Depth: (cm) 0.5 Volume: (cm) 18.378 Character of Wound/Ulcer Post Debridement: Stable Post Procedure Diagnosis Same as Pre-procedure Electronic Signature(s) Signed: 01/21/2023 3:33:32 PM By: Baltazar Najjar MD Signed: 01/21/2023 4:52:24 PM By: Midge Aver MSN RN CNS WTA Entered By: Midge Aver on 01/20/2023 08:21:36 Mceachern, [name] ([id number]) [id number] Page 2 of 7 -------------------------------------------------------------------------------- HPI Details Patient Name: Date of Service: [name], [name] 01/20/2023  7:45 A M Medical Record Number: [id number] Patient Account Number: [id number] Date of Birth/Sex: Treating RN: 1956-03-05 (67 y.o. [name] Primary Care Provider: Saralyn Pilar Other Clinician: Referring Provider: Treating Provider/Extender: RO BSO N, [name], [name] in Treatment: 9 History of Present Illness HPI Description: 11/18/2022 [name] is a 67 year old male with a past medical history of nerve conduction deafness that presents to the clinic for a 7 month history of nonhealing ulcers to the right lower extremity. He states these happened spontaneously and have progressively gotten worse. The more proximal wound has been present for 7 months and the most recent wound on the top of the foot/distal leg has been present for the past month. He has 5 wounds total. He visited the ED on 11/04/2022 for this issue. He was given cephalexin for which he completed. He has been using Several ointments at different times to address this issue. These ointments include Silvadene, mupirocin and clobetasol.. At times he keeps the wounds open to air. He also states he will go in the pool with the wounds exposed. For work he is on his feet for long periods of time. He does not wear compression stockings. ABIs in office were 0.8. He currently denies signs of infection. 8/28; patient presents for follow-up. He has been using Vashe wet-to-dry dressings to the wound beds. Patient's wound culture showed Pseudomonas aeruginosa sensitive to ciprofloxacin and this was sent into the pharmacy. Also recommended Keystone antibiotic ointment And this has already been ordered. He has not received this yet. He had a skin biopsy done as well that showed fibrosis inflammation. This was nonspecific. Patient declined debridement due to chronic pain. 9/4; patient presents for follow-up. He has been using Vashe wet-to-dry dressings to the wound beds. He obtains the [facility] antibiotic  ointment tomorrow. He has 1 day left of his oral antibiotics. There has been improvement in size since last clinic visit to the wound beds. He currently denies systemic signs of infection. 9/11; patient presents for  GAROLD, BROERS ([id number]) [id number] Page 1 of 7 Visit Report for 01/20/2023 Debridement Details Patient Name: Date of Service: [name], [name]. 01/20/2023 7:45 A M Medical Record Number: [id number] Patient Account Number: [id number] Date of Birth/Sex: Treating RN: [birth year] (67 y.o. [name] Primary Care Provider: Saralyn Pilar Other Clinician: Referring Provider: Treating Provider/Extender: RO BSO Dorris Carnes, [name], [name] in Treatment: 9 Debridement Performed for Assessment: Wound #1 Right,Lateral Lower Leg Performed By: Physician [name], MD Debridement Type: Debridement Level of Consciousness (Pre-procedure): Awake and Alert Pre-procedure Verification/Time Out Yes - 08:20 Taken: Start Time: 08:20 Pain Control: Lidocaine 4% T opical Solution Percent of Wound Bed Debrided: 100% T Area Debrided (cm): otal 36.74 Tissue and other material debrided: Viable, Non-Viable, Slough, Subcutaneous, Slough Level: Skin/Subcutaneous Tissue Debridement Description: Excisional Instrument: Curette Bleeding: Moderate Hemostasis Achieved: Pressure Procedural Pain: 5 Post Procedural Pain: 6 Response to Treatment: Procedure was tolerated well Level of Consciousness (Post- Awake and Alert procedure): Post Debridement Measurements of Total Wound Length: (cm) 9 Width: (cm) 5.2 Depth: (cm) 0.5 Volume: (cm) 18.378 Character of Wound/Ulcer Post Debridement: Stable Post Procedure Diagnosis Same as Pre-procedure Electronic Signature(s) Signed: 01/21/2023 3:33:32 PM By: Baltazar Najjar MD Signed: 01/21/2023 4:52:24 PM By: Midge Aver MSN RN CNS WTA Entered By: Midge Aver on 01/20/2023 08:21:36 Mceachern, [name] ([id number]) [id number] Page 2 of 7 -------------------------------------------------------------------------------- HPI Details Patient Name: Date of Service: [name], [name] 01/20/2023  7:45 A M Medical Record Number: [id number] Patient Account Number: [id number] Date of Birth/Sex: Treating RN: 1956-03-05 (67 y.o. [name] Primary Care Provider: Saralyn Pilar Other Clinician: Referring Provider: Treating Provider/Extender: RO BSO N, [name], [name] in Treatment: 9 History of Present Illness HPI Description: 11/18/2022 [name] is a 67 year old male with a past medical history of nerve conduction deafness that presents to the clinic for a 7 month history of nonhealing ulcers to the right lower extremity. He states these happened spontaneously and have progressively gotten worse. The more proximal wound has been present for 7 months and the most recent wound on the top of the foot/distal leg has been present for the past month. He has 5 wounds total. He visited the ED on 11/04/2022 for this issue. He was given cephalexin for which he completed. He has been using Several ointments at different times to address this issue. These ointments include Silvadene, mupirocin and clobetasol.. At times he keeps the wounds open to air. He also states he will go in the pool with the wounds exposed. For work he is on his feet for long periods of time. He does not wear compression stockings. ABIs in office were 0.8. He currently denies signs of infection. 8/28; patient presents for follow-up. He has been using Vashe wet-to-dry dressings to the wound beds. Patient's wound culture showed Pseudomonas aeruginosa sensitive to ciprofloxacin and this was sent into the pharmacy. Also recommended Keystone antibiotic ointment And this has already been ordered. He has not received this yet. He had a skin biopsy done as well that showed fibrosis inflammation. This was nonspecific. Patient declined debridement due to chronic pain. 9/4; patient presents for follow-up. He has been using Vashe wet-to-dry dressings to the wound beds. He obtains the [facility] antibiotic  ointment tomorrow. He has 1 day left of his oral antibiotics. There has been improvement in size since last clinic visit to the wound beds. He currently denies systemic signs of infection. 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 [name] Primary Care Provider: Saralyn Pilar Other Clinician: Referring Provider: Treating Provider/Extender: RO BSO N, [name], [name] in Treatment: 9 Subjective History of Present Illness (HPI) 11/18/2022 Mr. Aldo Passwater is a 67 year old male with a past medical history of nerve conduction deafness that presents to the clinic for a 7 month history of nonhealing ulcers to the right lower extremity. He states these happened spontaneously and have progressively  gotten worse. The more proximal wound has been present for 7 months and the most recent wound on the top of the foot/distal leg has been present for the past month. He has 5 wounds total. He visited the ED on 11/04/2022 for this issue. He was given cephalexin for which he completed. He has been using Several ointments at different times to address this issue. These ointments include Silvadene, mupirocin and clobetasol.. At times he keeps the wounds open to air. He also states he will go in the pool with the wounds exposed. For work he is on his feet for long periods of time. He does not wear compression stockings. ABIs in office were 0.8. He currently denies signs of infection. 8/28; patient presents for follow-up. He has been using Vashe wet-to-dry dressings to the wound beds. Patient's wound culture showed Pseudomonas aeruginosa sensitive to ciprofloxacin and this was sent into the pharmacy. Also recommended Keystone antibiotic ointment And this has already been ordered. He has not received this yet. He had a skin biopsy done as well that showed fibrosis inflammation. This was nonspecific. Patient declined debridement due to chronic pain. 9/4; patient presents for follow-up. He has been using Vashe wet-to-dry dressings to the wound beds. He obtains the [facility] antibiotic ointment tomorrow. He has 1 day left of his oral antibiotics. There has been improvement in size since last clinic visit to the wound beds. He currently denies systemic signs of infection. 9/11; patient presents for follow-up. He has been using Keystone antibiotic ointment to the wound beds. He has tolerated this treatment well. Wound measurements are stable however periwound appears less irritated. 9/18; patient using Keystone and the ABD pad and Tubigrip. Unfortunately the Keystone cream coalesced over the surface of the wounds requiring an aggressive debridement to remove it. Under the [facility] the surface of the wound was not  viable requiring further aggressive debridement. The patient has 4 wounds on the right lateral leg which are venous in etiology and a clustered localized area 9/25; this patient has a difficult cluster of wounds on the right lateral lower leg and ankle. Very painful. We have been using Keystone, Hydrofera Blue and Tubigrip compression. His ABI is 0.8 in this clinic. 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 [name] Primary Care Provider: Saralyn Pilar Other Clinician: Referring Provider: Treating Provider/Extender: RO BSO N, [name], [name] in Treatment: 9 Subjective History of Present Illness (HPI) 11/18/2022 Mr. Aldo Passwater is a 67 year old male with a past medical history of nerve conduction deafness that presents to the clinic for a 7 month history of nonhealing ulcers to the right lower extremity. He states these happened spontaneously and have progressively  gotten worse. The more proximal wound has been present for 7 months and the most recent wound on the top of the foot/distal leg has been present for the past month. He has 5 wounds total. He visited the ED on 11/04/2022 for this issue. He was given cephalexin for which he completed. He has been using Several ointments at different times to address this issue. These ointments include Silvadene, mupirocin and clobetasol.. At times he keeps the wounds open to air. He also states he will go in the pool with the wounds exposed. For work he is on his feet for long periods of time. He does not wear compression stockings. ABIs in office were 0.8. He currently denies signs of infection. 8/28; patient presents for follow-up. He has been using Vashe wet-to-dry dressings to the wound beds. Patient's wound culture showed Pseudomonas aeruginosa sensitive to ciprofloxacin and this was sent into the pharmacy. Also recommended Keystone antibiotic ointment And this has already been ordered. He has not received this yet. He had a skin biopsy done as well that showed fibrosis inflammation. This was nonspecific. Patient declined debridement due to chronic pain. 9/4; patient presents for follow-up. He has been using Vashe wet-to-dry dressings to the wound beds. He obtains the [facility] antibiotic ointment tomorrow. He has 1 day left of his oral antibiotics. There has been improvement in size since last clinic visit to the wound beds. He currently denies systemic signs of infection. 9/11; patient presents for follow-up. He has been using Keystone antibiotic ointment to the wound beds. He has tolerated this treatment well. Wound measurements are stable however periwound appears less irritated. 9/18; patient using Keystone and the ABD pad and Tubigrip. Unfortunately the Keystone cream coalesced over the surface of the wounds requiring an aggressive debridement to remove it. Under the [facility] the surface of the wound was not  viable requiring further aggressive debridement. The patient has 4 wounds on the right lateral leg which are venous in etiology and a clustered localized area 9/25; this patient has a difficult cluster of wounds on the right lateral lower leg and ankle. Very painful. We have been using Keystone, Hydrofera Blue and Tubigrip compression. His ABI is 0.8 in this clinic. As far as I can see he is not seen vein and vascular for  GAROLD, BROERS ([id number]) [id number] Page 1 of 7 Visit Report for 01/20/2023 Debridement Details Patient Name: Date of Service: [name], [name]. 01/20/2023 7:45 A M Medical Record Number: [id number] Patient Account Number: [id number] Date of Birth/Sex: Treating RN: [birth year] (67 y.o. [name] Primary Care Provider: Saralyn Pilar Other Clinician: Referring Provider: Treating Provider/Extender: RO BSO Dorris Carnes, [name], [name] in Treatment: 9 Debridement Performed for Assessment: Wound #1 Right,Lateral Lower Leg Performed By: Physician [name], MD Debridement Type: Debridement Level of Consciousness (Pre-procedure): Awake and Alert Pre-procedure Verification/Time Out Yes - 08:20 Taken: Start Time: 08:20 Pain Control: Lidocaine 4% T opical Solution Percent of Wound Bed Debrided: 100% T Area Debrided (cm): otal 36.74 Tissue and other material debrided: Viable, Non-Viable, Slough, Subcutaneous, Slough Level: Skin/Subcutaneous Tissue Debridement Description: Excisional Instrument: Curette Bleeding: Moderate Hemostasis Achieved: Pressure Procedural Pain: 5 Post Procedural Pain: 6 Response to Treatment: Procedure was tolerated well Level of Consciousness (Post- Awake and Alert procedure): Post Debridement Measurements of Total Wound Length: (cm) 9 Width: (cm) 5.2 Depth: (cm) 0.5 Volume: (cm) 18.378 Character of Wound/Ulcer Post Debridement: Stable Post Procedure Diagnosis Same as Pre-procedure Electronic Signature(s) Signed: 01/21/2023 3:33:32 PM By: Baltazar Najjar MD Signed: 01/21/2023 4:52:24 PM By: Midge Aver MSN RN CNS WTA Entered By: Midge Aver on 01/20/2023 08:21:36 Mceachern, [name] ([id number]) [id number] Page 2 of 7 -------------------------------------------------------------------------------- HPI Details Patient Name: Date of Service: [name], [name] 01/20/2023  7:45 A M Medical Record Number: [id number] Patient Account Number: [id number] Date of Birth/Sex: Treating RN: 1956-03-05 (67 y.o. [name] Primary Care Provider: Saralyn Pilar Other Clinician: Referring Provider: Treating Provider/Extender: RO BSO N, [name], [name] in Treatment: 9 History of Present Illness HPI Description: 11/18/2022 [name] is a 67 year old male with a past medical history of nerve conduction deafness that presents to the clinic for a 7 month history of nonhealing ulcers to the right lower extremity. He states these happened spontaneously and have progressively gotten worse. The more proximal wound has been present for 7 months and the most recent wound on the top of the foot/distal leg has been present for the past month. He has 5 wounds total. He visited the ED on 11/04/2022 for this issue. He was given cephalexin for which he completed. He has been using Several ointments at different times to address this issue. These ointments include Silvadene, mupirocin and clobetasol.. At times he keeps the wounds open to air. He also states he will go in the pool with the wounds exposed. For work he is on his feet for long periods of time. He does not wear compression stockings. ABIs in office were 0.8. He currently denies signs of infection. 8/28; patient presents for follow-up. He has been using Vashe wet-to-dry dressings to the wound beds. Patient's wound culture showed Pseudomonas aeruginosa sensitive to ciprofloxacin and this was sent into the pharmacy. Also recommended Keystone antibiotic ointment And this has already been ordered. He has not received this yet. He had a skin biopsy done as well that showed fibrosis inflammation. This was nonspecific. Patient declined debridement due to chronic pain. 9/4; patient presents for follow-up. He has been using Vashe wet-to-dry dressings to the wound beds. He obtains the [facility] antibiotic  ointment tomorrow. He has 1 day left of his oral antibiotics. There has been improvement in size since last clinic visit to the wound beds. He currently denies systemic signs of infection. 9/11; patient presents for  GAROLD, BROERS ([id number]) [id number] Page 1 of 7 Visit Report for 01/20/2023 Debridement Details Patient Name: Date of Service: [name], [name]. 01/20/2023 7:45 A M Medical Record Number: [id number] Patient Account Number: [id number] Date of Birth/Sex: Treating RN: [birth year] (67 y.o. [name] Primary Care Provider: Saralyn Pilar Other Clinician: Referring Provider: Treating Provider/Extender: RO BSO Dorris Carnes, [name], [name] in Treatment: 9 Debridement Performed for Assessment: Wound #1 Right,Lateral Lower Leg Performed By: Physician [name], MD Debridement Type: Debridement Level of Consciousness (Pre-procedure): Awake and Alert Pre-procedure Verification/Time Out Yes - 08:20 Taken: Start Time: 08:20 Pain Control: Lidocaine 4% T opical Solution Percent of Wound Bed Debrided: 100% T Area Debrided (cm): otal 36.74 Tissue and other material debrided: Viable, Non-Viable, Slough, Subcutaneous, Slough Level: Skin/Subcutaneous Tissue Debridement Description: Excisional Instrument: Curette Bleeding: Moderate Hemostasis Achieved: Pressure Procedural Pain: 5 Post Procedural Pain: 6 Response to Treatment: Procedure was tolerated well Level of Consciousness (Post- Awake and Alert procedure): Post Debridement Measurements of Total Wound Length: (cm) 9 Width: (cm) 5.2 Depth: (cm) 0.5 Volume: (cm) 18.378 Character of Wound/Ulcer Post Debridement: Stable Post Procedure Diagnosis Same as Pre-procedure Electronic Signature(s) Signed: 01/21/2023 3:33:32 PM By: Baltazar Najjar MD Signed: 01/21/2023 4:52:24 PM By: Midge Aver MSN RN CNS WTA Entered By: Midge Aver on 01/20/2023 08:21:36 Mceachern, [name] ([id number]) [id number] Page 2 of 7 -------------------------------------------------------------------------------- HPI Details Patient Name: Date of Service: [name], [name] 01/20/2023  7:45 A M Medical Record Number: [id number] Patient Account Number: [id number] Date of Birth/Sex: Treating RN: 1956-03-05 (67 y.o. [name] Primary Care Provider: Saralyn Pilar Other Clinician: Referring Provider: Treating Provider/Extender: RO BSO N, [name], [name] in Treatment: 9 History of Present Illness HPI Description: 11/18/2022 [name] is a 67 year old male with a past medical history of nerve conduction deafness that presents to the clinic for a 7 month history of nonhealing ulcers to the right lower extremity. He states these happened spontaneously and have progressively gotten worse. The more proximal wound has been present for 7 months and the most recent wound on the top of the foot/distal leg has been present for the past month. He has 5 wounds total. He visited the ED on 11/04/2022 for this issue. He was given cephalexin for which he completed. He has been using Several ointments at different times to address this issue. These ointments include Silvadene, mupirocin and clobetasol.. At times he keeps the wounds open to air. He also states he will go in the pool with the wounds exposed. For work he is on his feet for long periods of time. He does not wear compression stockings. ABIs in office were 0.8. He currently denies signs of infection. 8/28; patient presents for follow-up. He has been using Vashe wet-to-dry dressings to the wound beds. Patient's wound culture showed Pseudomonas aeruginosa sensitive to ciprofloxacin and this was sent into the pharmacy. Also recommended Keystone antibiotic ointment And this has already been ordered. He has not received this yet. He had a skin biopsy done as well that showed fibrosis inflammation. This was nonspecific. Patient declined debridement due to chronic pain. 9/4; patient presents for follow-up. He has been using Vashe wet-to-dry dressings to the wound beds. He obtains the [facility] antibiotic  ointment tomorrow. He has 1 day left of his oral antibiotics. There has been improvement in size since last clinic visit to the wound beds. He currently denies systemic signs of infection. 9/11; patient presents for

## 2023-01-21 NOTE — Progress Notes (Signed)
N/A Classification: Support Structures Medium N/A N/A Exudate A mount: Serosanguineous N/A N/A Exudate Type: red, brown N/A N/A Exudate Color: None Present (0%) N/A N/A Granulation Amount: Large (67-100%) N/A N/A Necrotic Amount: Eschar N/A N/A Necrotic Tissue: Fat Layer (Subcutaneous Tissue): Yes N/A N/A Exposed Structures: Fascia: No Tendon: No Muscle: No Joint: No Bone: No None N/A N/A Epithelialization: Treatment Notes Electronic Signature(s) Signed: 01/21/2023 4:52:24 PM By: Midge Aver MSN RN CNS WTA Entered By: Midge Aver on 01/20/2023 05:18:17 -------------------------------------------------------------------------------- Multi-Disciplinary Care Plan Details Patient Name: Date of Service: [name], [name] 01/20/2023 7:45 A M Medical Record Number: [id number] Patient Account Number: [id number] Date of Birth/Sex: Treating RN: 12-Apr-1955 (67 y.o. [name] Primary Care Jerzee Jerome: Saralyn Pilar Other Clinician: Referring Bhumi Godbey: Treating Lucylle Foulkes/Extender: RO BSO Dorris Carnes, [name], [name] in Treatment: 9 Active Inactive Necrotic  Tissue Nursing Diagnoses: Knowledge deficit related to management of necrotic/devitalized tissue Goals: Patient/caregiver will verbalize understanding of reason and process for debridement of necrotic tissue Date Initiated: 11/18/2022 Target Resolution Date: 02/18/2023 Goal Status: Active Interventions: Assess patient pain level pre-, during and post procedure and prior to discharge Notes: Wound/Skin Impairment Nursing DiagnosesJUANFRANCISCO, BOSKET ([id number]) [id number] Page 6 of 9 Knowledge deficit related to ulceration/compromised skin integrity Goals: Patient/caregiver will verbalize understanding of skin care regimen Date Initiated: 11/18/2022 Date Inactivated: 12/30/2022 Target Resolution Date: 12/19/2022 Goal Status: Met Ulcer/skin breakdown will have a volume reduction of 30% by week 4 Date Initiated: 11/18/2022 Date Inactivated: 12/30/2022 Target Resolution Date: 12/19/2022 Goal Status: Met Ulcer/skin breakdown will have a volume reduction of 50% by week 8 Date Initiated: 11/18/2022 Date Inactivated: 01/13/2023 Target Resolution Date: 01/18/2023 Goal Status: Met Ulcer/skin breakdown will have a volume reduction of 80% by week 12 Date Initiated: 11/18/2022 Target Resolution Date: 02/18/2023 Goal Status: Active Ulcer/skin breakdown will heal within 14 weeks Date Initiated: 11/18/2022 Target Resolution Date: 03/20/2023 Goal Status: Active Interventions: Assess patient/caregiver ability to obtain necessary supplies Assess patient/caregiver ability to perform ulcer/skin care regimen upon admission and as needed Assess ulceration(s) every visit Notes: Electronic Signature(s) Signed: 01/21/2023 4:52:24 PM By: Midge Aver MSN RN CNS WTA Entered By: Midge Aver on 01/20/2023 05:24:31 -------------------------------------------------------------------------------- Pain Assessment Details Patient Name: Date of Service: [name], [name] 01/20/2023 7:45 A  M Medical Record Number: [id number] Patient Account Number: [id number] Date of Birth/Sex: Treating RN: [birth year] (67 y.o. [name] Primary Care Yevette Knust: Saralyn Pilar Other Clinician: Referring Ciin Brazzel: Treating Demont Linford/Extender: RO BSO N, [name], [name] in Treatment: 9 Active Problems Location of Pain Severity and Description of Pain Patient Has Paino No Site Locations [place], [place] ([id number]) [id number] Page 7 of 9 Pain Management and Medication Current Pain Management: Electronic Signature(s) Signed: 01/21/2023 4:52:24 PM By: Midge Aver MSN RN CNS WTA Entered By: Midge Aver on 01/20/2023 04:56:31 -------------------------------------------------------------------------------- Patient/Caregiver Education Details Patient Name: Date of Service: [name] 10/23/2024andnbsp7:45 A M Medical Record Number: [id number] Patient Account Number: [id number] Date of Birth/Gender: Treating RN: 03/29/1956 (67 y.o. [name] Primary Care Physician: Saralyn Pilar Other Clinician: Referring Physician: Treating Physician/Extender: RO BSO Dorris Carnes, [name], [name] in Treatment: 9 Education Assessment Education Provided To: Patient Education Topics Provided Wound Debridement: Handouts: Wound Debridement Methods: Explain/Verbal Responses: State content correctly Wound/Skin Impairment: Handouts: Caring for Your Ulcer Methods: Explain/Verbal Responses: State content correctly Electronic Signature(s) Signed: 01/21/2023 4:52:24 PM By: Midge Aver MSN RN CNS WTA Entered By: Midge Aver on 01/20/2023  BRONSYN, HARROWER 01/20/2023 7:45 A M Medical Record Number: [id number] Patient Account Number: [id number] Date of Birth/Sex: Treating RN: 1955-10-20 (67 y.o. [name] Primary Care Naphtali Riede: Saralyn Pilar Other Clinician: Referring Gerldine Suleiman: Treating Tajanay Hurley/Extender: RO BSO Dorris Carnes, [name], [name] in Treatment: 9 Compression Therapy Performed for Wound Assessment: Wound #1 Right,Lateral Lower Leg Performed By: Clinician Midge Aver, RN Compression Type: Four Layer Post Procedure Diagnosis Same as Driscilla Moats ([id number]) [id number] Page 3 of 9 Electronic Signature(s) Signed: 01/21/2023 4:52:24 PM By: Midge Aver MSN RN CNS WTA Entered By: Midge Aver on 01/20/2023 05:21:59 -------------------------------------------------------------------------------- Encounter Discharge Information Details Patient Name: Date of Service: [name], [name] 01/20/2023 7:45 A M Medical Record Number: [id number] Patient Account Number: [id number] Date of Birth/Sex: Treating RN: 1955-12-20 (67 y.o. [name] Primary Care Breckin Savannah: Saralyn Pilar Other Clinician: Referring Mandee Pluta: Treating Rudine Rieger/Extender: RO BSO N, [name], [name] in Treatment: 9 Encounter Discharge Information Items Post Procedure Vitals Discharge Condition: Stable Temperature (F): 97.5 Ambulatory Status: Ambulatory Pulse (bpm): 65 Discharge Destination: Home Respiratory Rate (breaths/min): 18 Transportation: Private Auto Blood Pressure (mmHg): 137/80 Accompanied By: wife Schedule Follow-up Appointment: Yes Clinical Summary of Care: Electronic Signature(s) Signed: 01/21/2023 4:52:24 PM By: Midge Aver MSN RN CNS WTA Entered By: Midge Aver on  01/20/2023 05:25:41 -------------------------------------------------------------------------------- Lower Extremity Assessment Details Patient Name: Date of Service: [name], Dannel M. 01/20/2023 7:45 A M Medical Record Number: [id number] Patient Account Number: [id number] Date of Birth/Sex: Treating RN: 1955/05/23 (67 y.o. [name] Primary Care Shatima Zalar: Saralyn Pilar Other Clinician: Referring Shan Padgett: Treating Zamyia Gowell/Extender: RO BSO Dorris Carnes, [name], [name] in Treatment: 9 Edema Assessment Assessed: [Left: No] [Right: No] [Left: Edema] [Right: :] Calf Left: Right: Point of Measurement: 32 cm From Medial Instep 36 cm Nawabi, [name] ([id number]) [id number] Page 4 of 9 Ankle Left: Right: Point of Measurement: 11 cm From Medial Instep 29 cm Vascular Assessment Pulses: Dorsalis Pedis Palpable: [Right:Yes] Extremity colors, hair growth, and conditions: Extremity Color: [Right:Hyperpigmented] Hair Growth on Extremity: [Right:No] Temperature of Extremity: [Right:Warm] Capillary Refill: [Right:< 3 seconds] Dependent Rubor: [Right:No] Blanched when Elevated: [Right:No No] Toe Nail Assessment Left: Right: Thick: No Discolored: No Deformed: No Improper Length and Hygiene: No Electronic Signature(s) Signed: 01/21/2023 4:52:24 PM By: Midge Aver MSN RN CNS WTA Entered By: Midge Aver on 01/20/2023 05:18:12 -------------------------------------------------------------------------------- Multi Wound Chart Details Patient Name: Date of Service: [name], [name] 01/20/2023 7:45 A M Medical Record Number: [id number] Patient Account Number: [id number] Date of Birth/Sex: Treating RN: 1955/09/20 (67 y.o. [name] Primary Care Danton Palmateer: Saralyn Pilar Other Clinician: Referring Shaquile Lutze: Treating Monta Police/Extender: RO BSO N, [name], [name] in Treatment: 9 Vital Signs Height(in):  67 Pulse(bpm): 65 Weight(lbs): 174 Blood Pressure(mmHg): 137/80 Body Mass Index(BMI): 27.2 Temperature(F): 97.5 Respiratory Rate(breaths/min): 18 [1:Photos:] [N/A:N/A] Right, Lateral Lower Leg N/A N/A Wound Location: Gradually Appeared N/A N/A Wounding Event: Atypical N/A N/A Primary EtiologyBHARATH, PEEK ([id number]) [id number] Page 5 of 9 Asthma, Hypertension N/A N/A Comorbid History: 08/29/2022 N/A N/A Date Acquired: 9 N/A N/A Weeks of Treatment: Open N/A N/A Wound Status: No N/A N/A Wound Recurrence: Yes N/A N/A Clustered Wound: 9x5.2x0.5 N/A N/A Measurements L x W x D (cm) 36.757 N/A N/A A (cm) : rea 18.378 N/A N/A Volume (cm) : 61.30% N/A N/A % Reduction in Area: 51.70% N/A N/A % Reduction in Volume: Full Thickness Without Exposed N/A  DONEL, MORDECAI ([id number]) [id number] Page 1 of 9 Visit Report for 01/20/2023 Arrival Information Details Patient Name: Date of Service: YADER, POLMAN. 01/20/2023 7:45 A M Medical Record Number: [id number] Patient Account Number: [id number] Date of Birth/Sex: Treating RN: [birth year] (67 y.o. [name] Primary Care Rayford Williamsen: Saralyn Pilar Other Clinician: Referring Rein Popov: Treating Rachel Rison/Extender: RO BSO N, [name], [name] in Treatment: 9 Visit Information History Since Last Visit Added or deleted any medications: No Patient Arrived: Ambulatory Any new allergies or adverse reactions: No Arrival Time: 07:51 Has Dressing in Place as Prescribed: Yes Accompanied By: wife Has Compression in Place as Prescribed: Yes Transfer Assistance: None Pain Present Now: No Patient Requires Transmission-Based Precautions: No Patient Has Alerts: No Electronic Signature(s) Signed: 01/21/2023 4:52:24 PM By: Midge Aver MSN RN CNS WTA Entered By: Midge Aver on 01/20/2023 04:53:26 -------------------------------------------------------------------------------- Clinic Level of Care Assessment Details Patient Name: Date of Service: [name], [name] 01/20/2023 7:45 A M Medical Record Number: [id number] Patient Account Number: [id number] Date of Birth/Sex: Treating RN: [birth year] (67 y.o. [name] Primary Care Keilani Terrance: Saralyn Pilar Other Clinician: Referring Jasmin Winberry: Treating Rishika Mccollom/Extender: RO BSO N, [name], [name] in Treatment: 9 Clinic Level of Care Assessment Items TOOL 1 Quantity Score []  - 0 Use when EandM and Procedure is performed on INITIAL visit ASSESSMENTS - Nursing Assessment / Reassessment []  - 0 General Physical Exam (combine w/ comprehensive assessment (listed just below) when performed on new pt. evals) []  - 0 Comprehensive Assessment (HX, ROS, Risk Assessments,  Wounds Hx, etc.) ASSESSMENTS - Wound and Skin Assessment / Reassessment []  - 0 Dermatologic / Skin Assessment (not related to wound area) ASSESSMENTS - Ostomy and/or Continence Assessment and Care []  - 0 Incontinence Assessment and Management []  - 0 Ostomy Care Assessment and Management (repouching, etc.) Switzer, [name] ([id number]) [id number] Page 2 of 9 PROCESS - Coordination of Care []  - 0 Simple Patient / Family Education for ongoing care []  - 0 Complex (extensive) Patient / Family Education for ongoing care []  - 0 Staff obtains Chiropractor, Records, T Results / Process Orders est []  - 0 Staff telephones HHA, Nursing Homes / Clarify orders / etc []  - 0 Routine Transfer to another Facility (non-emergent condition) []  - 0 Routine Hospital Admission (non-emergent condition) []  - 0 New Admissions / Manufacturing engineer / Ordering NPWT Apligraf, etc. , []  - 0 Emergency Hospital Admission (emergent condition) PROCESS - Special Needs []  - 0 Pediatric / Minor Patient Management []  - 0 Isolation Patient Management []  - 0 Hearing / Language / Visual special needs []  - 0 Assessment of Community assistance (transportation, D/C planning, etc.) []  - 0 Additional assistance / Altered mentation []  - 0 Support Surface(s) Assessment (bed, cushion, seat, etc.) INTERVENTIONS - Miscellaneous []  - 0 External ear exam []  - 0 Patient Transfer (multiple staff / Nurse, adult / Similar devices) []  - 0 Simple Staple / Suture removal (25 or less) []  - 0 Complex Staple / Suture removal (26 or more) []  - 0 Hypo/Hyperglycemic Management (do not check if billed separately) []  - 0 Ankle / Brachial Index (ABI) - do not check if billed separately Has the patient been seen at the hospital within the last three years: Yes Total Score: 0 Level Of Care: ____ Electronic Signature(s) Signed: 01/21/2023 4:52:24 PM By: Midge Aver MSN RN CNS WTA Entered By: Midge Aver  on 01/20/2023 05:22:53 -------------------------------------------------------------------------------- Compression Therapy Details Patient Name: Date of Service:  N/A Classification: Support Structures Medium N/A N/A Exudate A mount: Serosanguineous N/A N/A Exudate Type: red, brown N/A N/A Exudate Color: None Present (0%) N/A N/A Granulation Amount: Large (67-100%) N/A N/A Necrotic Amount: Eschar N/A N/A Necrotic Tissue: Fat Layer (Subcutaneous Tissue): Yes N/A N/A Exposed Structures: Fascia: No Tendon: No Muscle: No Joint: No Bone: No None N/A N/A Epithelialization: Treatment Notes Electronic Signature(s) Signed: 01/21/2023 4:52:24 PM By: Midge Aver MSN RN CNS WTA Entered By: Midge Aver on 01/20/2023 05:18:17 -------------------------------------------------------------------------------- Multi-Disciplinary Care Plan Details Patient Name: Date of Service: [name], [name] 01/20/2023 7:45 A M Medical Record Number: [id number] Patient Account Number: [id number] Date of Birth/Sex: Treating RN: 12-Apr-1955 (67 y.o. [name] Primary Care Jerzee Jerome: Saralyn Pilar Other Clinician: Referring Bhumi Godbey: Treating Lucylle Foulkes/Extender: RO BSO Dorris Carnes, [name], [name] in Treatment: 9 Active Inactive Necrotic  Tissue Nursing Diagnoses: Knowledge deficit related to management of necrotic/devitalized tissue Goals: Patient/caregiver will verbalize understanding of reason and process for debridement of necrotic tissue Date Initiated: 11/18/2022 Target Resolution Date: 02/18/2023 Goal Status: Active Interventions: Assess patient pain level pre-, during and post procedure and prior to discharge Notes: Wound/Skin Impairment Nursing DiagnosesJUANFRANCISCO, BOSKET ([id number]) [id number] Page 6 of 9 Knowledge deficit related to ulceration/compromised skin integrity Goals: Patient/caregiver will verbalize understanding of skin care regimen Date Initiated: 11/18/2022 Date Inactivated: 12/30/2022 Target Resolution Date: 12/19/2022 Goal Status: Met Ulcer/skin breakdown will have a volume reduction of 30% by week 4 Date Initiated: 11/18/2022 Date Inactivated: 12/30/2022 Target Resolution Date: 12/19/2022 Goal Status: Met Ulcer/skin breakdown will have a volume reduction of 50% by week 8 Date Initiated: 11/18/2022 Date Inactivated: 01/13/2023 Target Resolution Date: 01/18/2023 Goal Status: Met Ulcer/skin breakdown will have a volume reduction of 80% by week 12 Date Initiated: 11/18/2022 Target Resolution Date: 02/18/2023 Goal Status: Active Ulcer/skin breakdown will heal within 14 weeks Date Initiated: 11/18/2022 Target Resolution Date: 03/20/2023 Goal Status: Active Interventions: Assess patient/caregiver ability to obtain necessary supplies Assess patient/caregiver ability to perform ulcer/skin care regimen upon admission and as needed Assess ulceration(s) every visit Notes: Electronic Signature(s) Signed: 01/21/2023 4:52:24 PM By: Midge Aver MSN RN CNS WTA Entered By: Midge Aver on 01/20/2023 05:24:31 -------------------------------------------------------------------------------- Pain Assessment Details Patient Name: Date of Service: [name], [name] 01/20/2023 7:45 A  M Medical Record Number: [id number] Patient Account Number: [id number] Date of Birth/Sex: Treating RN: [birth year] (67 y.o. [name] Primary Care Yevette Knust: Saralyn Pilar Other Clinician: Referring Ciin Brazzel: Treating Demont Linford/Extender: RO BSO N, [name], [name] in Treatment: 9 Active Problems Location of Pain Severity and Description of Pain Patient Has Paino No Site Locations [place], [place] ([id number]) [id number] Page 7 of 9 Pain Management and Medication Current Pain Management: Electronic Signature(s) Signed: 01/21/2023 4:52:24 PM By: Midge Aver MSN RN CNS WTA Entered By: Midge Aver on 01/20/2023 04:56:31 -------------------------------------------------------------------------------- Patient/Caregiver Education Details Patient Name: Date of Service: [name] 10/23/2024andnbsp7:45 A M Medical Record Number: [id number] Patient Account Number: [id number] Date of Birth/Gender: Treating RN: 03/29/1956 (67 y.o. [name] Primary Care Physician: Saralyn Pilar Other Clinician: Referring Physician: Treating Physician/Extender: RO BSO Dorris Carnes, [name], [name] in Treatment: 9 Education Assessment Education Provided To: Patient Education Topics Provided Wound Debridement: Handouts: Wound Debridement Methods: Explain/Verbal Responses: State content correctly Wound/Skin Impairment: Handouts: Caring for Your Ulcer Methods: Explain/Verbal Responses: State content correctly Electronic Signature(s) Signed: 01/21/2023 4:52:24 PM By: Midge Aver MSN RN CNS WTA Entered By: Midge Aver on 01/20/2023

## 2023-01-27 ENCOUNTER — Encounter: Payer: BC Managed Care – PPO | Admitting: Internal Medicine

## 2023-01-27 DIAGNOSIS — I872 Venous insufficiency (chronic) (peripheral): Secondary | ICD-10-CM | POA: Diagnosis not present

## 2023-01-27 DIAGNOSIS — I87311 Chronic venous hypertension (idiopathic) with ulcer of right lower extremity: Secondary | ICD-10-CM | POA: Diagnosis not present

## 2023-01-27 DIAGNOSIS — G8929 Other chronic pain: Secondary | ICD-10-CM | POA: Diagnosis not present

## 2023-01-27 DIAGNOSIS — J45909 Unspecified asthma, uncomplicated: Secondary | ICD-10-CM | POA: Diagnosis not present

## 2023-01-27 DIAGNOSIS — L97818 Non-pressure chronic ulcer of other part of right lower leg with other specified severity: Secondary | ICD-10-CM | POA: Diagnosis not present

## 2023-01-27 DIAGNOSIS — I10 Essential (primary) hypertension: Secondary | ICD-10-CM | POA: Diagnosis not present

## 2023-01-27 DIAGNOSIS — S81801A Unspecified open wound, right lower leg, initial encounter: Secondary | ICD-10-CM | POA: Diagnosis not present

## 2023-01-28 NOTE — Progress Notes (Signed)
By: Midge Aver MSN RN CNS WTA Entered By: Midge Aver on 01/27/2023 08:25:45 -------------------------------------------------------------------------------- Compression Therapy Details Patient Name: Date of Service: [name], [name] 01/27/2023 7:45 A M Medical Record Number: [id number] Patient Account Number: [id number] Date of Birth/Sex: Treating RN: Aug 24, 1955 (67 y.o. [name] Primary Care Klaryssa Fauth: Saralyn Pilar Other Clinician: Referring Thales Knipple: Treating Earnie Rockhold/Extender: RO BSO Dorris Carnes, [name], [name] in Treatment: 10 Compression Therapy Performed for Wound Assessment: Wound #1 Right,Lateral Lower Leg Performed By: Raj Janus, RN Compression Type: Four Regenia Skeeter ([id number]) [id number] Page 3 of 9 Post Procedure Diagnosis Same as Pre-procedure Electronic Signature(s) Signed: 01/27/2023 5:05:03 PM By: Midge Aver MSN RN CNS WTA Entered By: Midge Aver on 01/27/2023 08:20:50 -------------------------------------------------------------------------------- Encounter Discharge Information Details Patient Name: Date of Service: [name], [name] 01/27/2023 7:45 A M Medical Record Number: [id number] Patient Account Number: [id number] Date of Birth/Sex: Treating RN: 29-Nov-1955 (67 y.o. [name] Primary Care Patrena Santalucia: Saralyn Pilar Other Clinician: Referring Stillman Buenger: Treating Carmela Piechowski/Extender: RO BSO N, [name], [name] in Treatment: 10 Encounter Discharge Information Items Post Procedure Vitals Discharge Condition: Stable Temperature (F): 97.8 Ambulatory Status: Ambulatory Pulse (bpm): 64 Discharge Destination: Home Respiratory Rate (breaths/min): 18 Transportation: Private Auto Blood Pressure (mmHg): 136/80 Accompanied By: wife Schedule Follow-up  Appointment: Yes Clinical Summary of Care: Electronic Signature(s) Signed: 01/27/2023 5:05:03 PM By: Midge Aver MSN RN CNS WTA Entered By: Midge Aver on 01/27/2023 08:28:59 -------------------------------------------------------------------------------- Lower Extremity Assessment Details Patient Name: Date of Service: [name], [name] 01/27/2023 7:45 A M Medical Record Number: [id number] Patient Account Number: [id number] Date of Birth/Sex: Treating RN: 04-19-1955 (67 y.o. [name] Primary Care Evett Kassa: Saralyn Pilar Other Clinician: Referring Tracee Mccreery: Treating Thatiana Renbarger/Extender: RO BSO N, [name], [name] in Treatment: 10 Edema Assessment Assessed: [Left: No] Franne Forts: No] [Left: Edema] [Right: :] Pearletha Forge, Mukund M ([id number]) [id number] Page 4 of 9 Left: Right: Point of Measurement: 32 cm From Medial Instep 36 cm Ankle Left: Right: Point of Measurement: 11 cm From Medial Instep 29 cm Vascular Assessment Pulses: Dorsalis Pedis Palpable: [Right:Yes] Extremity colors, hair growth, and conditions: Extremity Color: [Right:Red] Hair Growth on Extremity: [Right:No] Temperature of Extremity: [Right:Warm] Capillary Refill: [Right:< 3 seconds] Dependent Rubor: [Right:No] Blanched when Elevated: [Right:No No] Toe Nail Assessment Left: Right: Thick: No Discolored: No Deformed: No Improper Length and Hygiene: No Electronic Signature(s) Signed: 01/27/2023 5:05:03 PM By: Midge Aver MSN RN CNS WTA Entered By: Midge Aver on 01/27/2023 08:13:15 -------------------------------------------------------------------------------- Multi Wound Chart Details Patient Name: Date of Service: [name], [name] 01/27/2023 7:45 A M Medical Record Number: [id number] Patient Account Number: [id number] Date of Birth/Sex: Treating RN: October 18, 1955 (67 y.o. [name] Primary Care Able Malloy: Saralyn Pilar Other  Clinician: Referring Dewanda Fennema: Treating Earlie Schank/Extender: RO BSO N, [name], [name] in Treatment: 10 Vital Signs Height(in): 67 Pulse(bpm): 64 Weight(lbs): 174 Blood Pressure(mmHg): 136/80 Body Mass Index(BMI): 27.2 Temperature(F): 97.8 Respiratory Rate(breaths/min): 18 [1:Photos:] [N/A:N/A] Hence, Elnathan M ([id number]) [1:Right, Lateral Lower Leg Wound Location: Gradually Appeared Wounding Event: Atypical Primary Etiology: Asthma, Hypertension Comorbid History: 08/29/2022 Date Acquired: 10 Weeks of Treatment: Open Wound Status: No Wound Recurrence: Yes Clustered Wound:  11x5.5x0.5 Measurements L x W x D (cm) 47.517 A (cm) : rea 23.758 Volume (cm) : 50.00% % Reduction in A rea: 37.50% % Reduction in Volume: Full Thickness Without Exposed Classification: Support Structures Medium Exudate A mount: Serosanguineous Exudate  [name] ([id number]) [id number] Page 8 of 9 -------------------------------------------------------------------------------- Wound Assessment Details Patient Name: Date of Service: KABLE, [name] 01/27/2023 7:45 A M Medical Record Number: [id number] Patient  Account Number: [id number] Date of Birth/Sex: Treating RN: [birth year] (67 y.o. [name] Primary Care Adreana Coull: Saralyn Pilar Other Clinician: Referring Gennett Garcia: Treating Braxson Hollingsworth/Extender: RO BSO N, [name], [name] in Treatment: 10 Wound Status Wound Number: 1 Primary Etiology: Atypical Wound Location: Right, Lateral Lower Leg Wound Status: Open Wounding Event: Gradually Appeared Comorbid History: Asthma, Hypertension Date Acquired: 08/29/2022 Weeks Of Treatment: 10 Clustered Wound: Yes Photos Wound Measurements Length: (cm) 11 Width: (cm) 5.5 Depth: (cm) 0.5 Area: (cm) 47.517 Volume: (cm) 23.758 % Reduction in Area: 50% % Reduction in Volume: 37.5% Epithelialization: None Wound Description Classification: Full Thickness Without Exposed Suppor Exudate Amount: Medium Exudate Type: Serosanguineous Exudate Color: red, brown t Structures Foul Odor After Cleansing: No Slough/Fibrino Yes Wound Bed Granulation Amount: None Present (0%) Exposed Structure Necrotic Amount: Large (67-100%) Fascia Exposed: No Necrotic Quality: Eschar Fat Layer (Subcutaneous Tissue) Exposed: Yes Tendon Exposed: No Muscle Exposed: No Joint Exposed: No Bone Exposed: No Treatment Notes Wound #1 (Lower Leg) Wound Laterality: Right, Lateral Cleanser Byram Ancillary Kit - 15 Day Supply Discharge Instruction: Use supplies as instructed; Kit contains: (15) Saline Bullets; (15) 3x3 Gauze; 15 pr Gloves Vashe 5.8 (oz) Staat, [name] ([id number]) [id number] Page 9 of 9 Discharge Instruction: Use vashe 5.8 (oz) as directed Peri-Wound Care Topical Santyl Collagenase Ointment, 30 (gm), tube Discharge Instruction: apply nickel thick to wound bed only Triamcinolone Acetonide Cream, 0.1%, 15 (g) tube Discharge Instruction: Apply as directed by Vyolet Sakuma. Primary Dressing Silvercel 4 1/4x 4 1/4 (in/in) Discharge Instruction: Apply Silvercel 4  1/4x 4 1/4 (in/in) as instructed Secondary Dressing ABD Pad 5x9 (in/in) Discharge Instruction: Cover with ABD pad Secured With Compression Wrap Urgo K2, two layer compression system, regular Compression Stockings Add-Ons Electronic Signature(s) Signed: 01/27/2023 5:05:03 PM By: Midge Aver MSN RN CNS WTA Entered By: Midge Aver on 01/27/2023 08:07:59 -------------------------------------------------------------------------------- Vitals Details Patient Name: Date of Service: [name], [place] M. 01/27/2023 7:45 A M Medical Record Number: [id number] Patient Account Number: [id number] Date of Birth/Sex: Treating RN: 1955/08/10 (67 y.o. [name] Primary Care Hazelee Harbold: Saralyn Pilar Other Clinician: Referring Nao Linz: Treating Raffi Milstein/Extender: RO BSO N, [name], [name] in Treatment: 10 Vital Signs Time Taken: 07:57 Temperature (F): 97.8 Height (in): 67 Pulse (bpm): 64 Weight (lbs): 174 Respiratory Rate (breaths/min): 18 Body Mass Index (BMI): 27.2 Blood Pressure (mmHg): 136/80 Reference Range: 80 - 120 mg / dl Electronic Signature(s) Signed: 01/27/2023 5:05:03 PM By: Midge Aver MSN RN CNS WTA Entered By: Midge Aver on 01/27/2023 07:58:03  [name] ([id number]) [id number] Page 8 of 9 -------------------------------------------------------------------------------- Wound Assessment Details Patient Name: Date of Service: KABLE, [name] 01/27/2023 7:45 A M Medical Record Number: [id number] Patient  Account Number: [id number] Date of Birth/Sex: Treating RN: [birth year] (67 y.o. [name] Primary Care Adreana Coull: Saralyn Pilar Other Clinician: Referring Gennett Garcia: Treating Braxson Hollingsworth/Extender: RO BSO N, [name], [name] in Treatment: 10 Wound Status Wound Number: 1 Primary Etiology: Atypical Wound Location: Right, Lateral Lower Leg Wound Status: Open Wounding Event: Gradually Appeared Comorbid History: Asthma, Hypertension Date Acquired: 08/29/2022 Weeks Of Treatment: 10 Clustered Wound: Yes Photos Wound Measurements Length: (cm) 11 Width: (cm) 5.5 Depth: (cm) 0.5 Area: (cm) 47.517 Volume: (cm) 23.758 % Reduction in Area: 50% % Reduction in Volume: 37.5% Epithelialization: None Wound Description Classification: Full Thickness Without Exposed Suppor Exudate Amount: Medium Exudate Type: Serosanguineous Exudate Color: red, brown t Structures Foul Odor After Cleansing: No Slough/Fibrino Yes Wound Bed Granulation Amount: None Present (0%) Exposed Structure Necrotic Amount: Large (67-100%) Fascia Exposed: No Necrotic Quality: Eschar Fat Layer (Subcutaneous Tissue) Exposed: Yes Tendon Exposed: No Muscle Exposed: No Joint Exposed: No Bone Exposed: No Treatment Notes Wound #1 (Lower Leg) Wound Laterality: Right, Lateral Cleanser Byram Ancillary Kit - 15 Day Supply Discharge Instruction: Use supplies as instructed; Kit contains: (15) Saline Bullets; (15) 3x3 Gauze; 15 pr Gloves Vashe 5.8 (oz) Staat, [name] ([id number]) [id number] Page 9 of 9 Discharge Instruction: Use vashe 5.8 (oz) as directed Peri-Wound Care Topical Santyl Collagenase Ointment, 30 (gm), tube Discharge Instruction: apply nickel thick to wound bed only Triamcinolone Acetonide Cream, 0.1%, 15 (g) tube Discharge Instruction: Apply as directed by Vyolet Sakuma. Primary Dressing Silvercel 4 1/4x 4 1/4 (in/in) Discharge Instruction: Apply Silvercel 4  1/4x 4 1/4 (in/in) as instructed Secondary Dressing ABD Pad 5x9 (in/in) Discharge Instruction: Cover with ABD pad Secured With Compression Wrap Urgo K2, two layer compression system, regular Compression Stockings Add-Ons Electronic Signature(s) Signed: 01/27/2023 5:05:03 PM By: Midge Aver MSN RN CNS WTA Entered By: Midge Aver on 01/27/2023 08:07:59 -------------------------------------------------------------------------------- Vitals Details Patient Name: Date of Service: [name], [place] M. 01/27/2023 7:45 A M Medical Record Number: [id number] Patient Account Number: [id number] Date of Birth/Sex: Treating RN: 1955/08/10 (67 y.o. [name] Primary Care Hazelee Harbold: Saralyn Pilar Other Clinician: Referring Nao Linz: Treating Raffi Milstein/Extender: RO BSO N, [name], [name] in Treatment: 10 Vital Signs Time Taken: 07:57 Temperature (F): 97.8 Height (in): 67 Pulse (bpm): 64 Weight (lbs): 174 Respiratory Rate (breaths/min): 18 Body Mass Index (BMI): 27.2 Blood Pressure (mmHg): 136/80 Reference Range: 80 - 120 mg / dl Electronic Signature(s) Signed: 01/27/2023 5:05:03 PM By: Midge Aver MSN RN CNS WTA Entered By: Midge Aver on 01/27/2023 07:58:03  SIDDIQ, MAHARREY ([id number]) [id number] Page 1 of 9 Visit Report for 01/27/2023 Arrival Information Details Patient Name: Date of Service: RUFINO, TACK 01/27/2023 7:45 A M Medical Record Number: [id number] Patient Account Number: [id number] Date of Birth/Sex: Treating RN: [birth year] (67 y.o. [name] Primary Care Toren Tucholski: Saralyn Pilar Other Clinician: Referring Enijah Furr: Treating Livio Ledwith/Extender: RO BSO N, [name], [name] in Treatment: 10 Visit Information History Since Last Visit Added or deleted any medications: No Patient Arrived: Ambulatory Any new allergies or adverse reactions: No Arrival Time: 07:50 Has Dressing in Place as Prescribed: Yes Accompanied By: wife Has Compression in Place as Prescribed: Yes Transfer Assistance: None Pain Present Now: No Patient Identification Verified: Yes Secondary Verification Process Completed: Yes Patient Requires Transmission-Based Precautions: No Patient Has Alerts: Yes Patient Alerts: R ABI 1.32 L ABI 1.24 R TBI .92 L TBI 1.05 Electronic Signature(s) Signed: 01/27/2023 11:11:53 AM By: Midge Aver MSN RN CNS WTA Entered By: Midge Aver on 01/27/2023 11:11:53 -------------------------------------------------------------------------------- Clinic Level of Care Assessment Details Patient Name: Date of Service: AZIR, BOLERJACK. 01/27/2023 7:45 A M Medical Record Number: [id number] Patient Account Number: [id number] Date of Birth/Sex: Treating RN: [birth year] (67 y.o. [name] Primary Care Hatsuko Bizzarro: Saralyn Pilar Other Clinician: Referring Carrianne Hyun: Treating Kimmie Doren/Extender: RO BSO N, [name], [name] in Treatment: 10 Clinic Level of Care Assessment Items TOOL 1 Quantity Score []  - 0 Use when EandM and Procedure is performed on INITIAL visit ASSESSMENTS - Nursing Assessment / Reassessment []  - 0 General Physical Exam  (combine w/ comprehensive assessment (listed just below) when performed on new pt. evals) []  - 0 Comprehensive Assessment (HX, ROS, Risk Assessments, Wounds Hx, etc.) ASSESSMENTS - Wound and Skin Assessment / Reassessment []  - 0 Dermatologic / Skin Assessment (not related to wound area) Theiss, [name] ([id number]) [id number] Page 2 of 9 ASSESSMENTS - Ostomy and/or Continence Assessment and Care []  - 0 Incontinence Assessment and Management []  - 0 Ostomy Care Assessment and Management (repouching, etc.) PROCESS - Coordination of Care []  - 0 Simple Patient / Family Education for ongoing care []  - 0 Complex (extensive) Patient / Family Education for ongoing care []  - 0 Staff obtains Chiropractor, Records, T Results / Process Orders est []  - 0 Staff telephones HHA, Nursing Homes / Clarify orders / etc []  - 0 Routine Transfer to another Facility (non-emergent condition) []  - 0 Routine Hospital Admission (non-emergent condition) []  - 0 New Admissions / Manufacturing engineer / Ordering NPWT Apligraf, etc. , []  - 0 Emergency Hospital Admission (emergent condition) PROCESS - Special Needs []  - 0 Pediatric / Minor Patient Management []  - 0 Isolation Patient Management []  - 0 Hearing / Language / Visual special needs []  - 0 Assessment of Community assistance (transportation, D/C planning, etc.) []  - 0 Additional assistance / Altered mentation []  - 0 Support Surface(s) Assessment (bed, cushion, seat, etc.) INTERVENTIONS - Miscellaneous []  - 0 External ear exam []  - 0 Patient Transfer (multiple staff / Nurse, adult / Similar devices) []  - 0 Simple Staple / Suture removal (25 or less) []  - 0 Complex Staple / Suture removal (26 or more) []  - 0 Hypo/Hyperglycemic Management (do not check if billed separately) []  - 0 Ankle / Brachial Index (ABI) - do not check if billed separately Has the patient been seen at the hospital within the last three years:  Yes Total Score: 0 Level Of Care: ____ Electronic Signature(s) Signed: 01/27/2023 5:05:03 PM

## 2023-01-28 NOTE — Progress Notes (Signed)
Day/30 Days ary Discharge Instructions: Apply Silvercel 4 1/4x 4 1/4 (in/in) as instructed Secondary Dressing: ABD Pad 5x9 (in/in) (Generic) Every Other Day/30 Days Discharge Instructions: Cover with ABD pad Schwanz, [name] ([id number]) [id number] Page 7 of 8 Compression Wrap: Urgo K2, two layer compression system, regular Every Other Day/30 Days 1. Things are not going well again this week. 2. Requiring significant debridement to clone clean off the wound surface 3. I changed him to Santyl and silver alginate to help with the drainage and add some debridement 4. No obvious infection I have put the [place] on hold 5. He has had previously unremarkable arterial studies on the right although this was noncompressible his TBI was normal his waveforms were multiphasic. I have seen situations where noninvasive tests are misleading. We will see what vascular surgery has to say about this. He also has I believe the glue greater saphenous vein reflux 6. It may be that he is simply on his feet too much for these to heal 7. We have not been able to get approval for any advanced treatment product although I am not sure at this point the wounds are ready for these. Electronic Signature(s) Signed: 01/27/2023 4:47:46 PM By: Baltazar Najjar MD Entered By: Baltazar Najjar on 01/27/2023 05:40:24 -------------------------------------------------------------------------------- SuperBill Details Patient Name: Date of Service: [name], [name] 01/27/2023 Medical Record Number: [id number] Patient Account Number: [id number] Date of Birth/Sex: Treating RN: Oct 02, 1955 (67 y.o. 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, unspecified Facility Procedures : CPT4 Code: 02725366 1 Description: 1042 - DEB SUBQ TISSUE 20 SQ CM/< ICD-10 Diagnosis Description I87.311 Chronic venous hypertension (idiopathic) with ulcer of right lower extremity L97.818 Non-pressure chronic ulcer of other part of right lower leg with other specified Modifier: severity Quantity: 1 : CPT4 Code: [id number] 1 Description: 1045 - DEB SUBQ TISS EA ADDL 20CM ICD-10 Diagnosis Description I87.311 Chronic venous hypertension (idiopathic) with ulcer of right lower extremity L97.818 Non-pressure chronic ulcer of other part of right lower leg with other specified Modifier: severity Quantity: 2 Physician Procedures : CPT4 Code Description Modifier [id number] 11042 - WC PHYS SUBQ TISS 20 SQ CM ICD-10 Diagnosis Description I87.311 Chronic venous hypertension (idiopathic) with ulcer of right lower extremity L97.818 Non-pressure chronic ulcer of other part of right lower  leg with other specified severity Quantity: 1 : [id number] 11045 - WC PHYS SUBQ TISS EA ADDL 20 CM ICD-10 Diagnosis Description I87.311 Chronic venous hypertension (idiopathic) with ulcer of right lower extremity [name], [name] M ([id number]) [id number] L97.818 Non-pressure chronic  ulcer of other part of right lower leg with other specified severity Quantity: 2 7.pdf Page 8 of 8 Electronic Signature(s) Signed: 01/27/2023 4:47:46 PM By: Baltazar Najjar MD Entered By: Baltazar Najjar on 01/27/2023 05:41:12  CNS WTA Entered By: Midge Aver on 01/27/2023 05:28:09 -------------------------------------------------------------------------------- Problem List Details Patient Name: Date of Service: [name], [name] 01/27/2023 7:45 A M Medical Record Number: [id number] Patient Account Number: [id number] Date of Birth/Sex: Treating RN: 1955/12/24 (67 y.o. 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 [name] Primary Care Provider: Saralyn Pilar Other Clinician: Referring Provider: Treating Provider/Extender: RO BSO N, [name], [name] in Treatment: 10 Subjective History of Present  Illness (HPI) 11/18/2022 [name] is a 67 year old male with a past medical history of nerve conduction deafness that presents to the clinic for a 7 month history of nonhealing ulcers to the right lower extremity. He states these happened spontaneously and have progressively gotten worse. The more proximal wound has been present for 7 months and the most recent wound on the top of the foot/distal leg has been present for the past month. He has 5 wounds total. He visited the ED on 11/04/2022 for this issue. He was given cephalexin for which he completed. He has been using Several ointments at different times to address this issue. These ointments include Silvadene, mupirocin and clobetasol.. At times he keeps the wounds open to air. He also states he will go in the pool with the wounds exposed. For work he is on his feet for long periods of time. He does not wear compression stockings. ABIs in office were 0.8. He currently denies signs of infection. 8/28; patient presents for follow-up. He has been using Vashe wet-to-dry dressings to the wound beds. Patient's wound culture showed Pseudomonas aeruginosa sensitive to ciprofloxacin and this was sent into the pharmacy. Also recommended Keystone antibiotic ointment And this has already been ordered. He has not received this yet. He had a skin biopsy done as well that showed fibrosis inflammation. This was nonspecific. Patient declined debridement due to chronic pain. 9/4; patient presents for follow-up. He has been using Vashe wet-to-dry dressings to the wound beds. He obtains the [facility] antibiotic ointment tomorrow. He has 1 day left of his oral antibiotics. There has been improvement in size since last clinic visit to the wound beds. He currently denies systemic signs of infection. 9/11; patient presents for follow-up. He has been using Keystone antibiotic ointment to the wound beds. He has tolerated this treatment well. Wound measurements are  stable however periwound appears less irritated. 9/18; patient using Keystone and the ABD pad and Tubigrip. Unfortunately the Keystone cream coalesced over the surface of the wounds requiring an aggressive debridement to remove it. Under the [facility] the surface of the wound was not viable requiring further aggressive debridement. The patient has 4 wounds on the right lateral leg which are venous in etiology  CNS WTA Entered By: Midge Aver on 01/27/2023 05:28:09 -------------------------------------------------------------------------------- Problem List Details Patient Name: Date of Service: [name], [name] 01/27/2023 7:45 A M Medical Record Number: [id number] Patient Account Number: [id number] Date of Birth/Sex: Treating RN: 1955/12/24 (67 y.o. 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 [name] Primary Care Provider: Saralyn Pilar Other Clinician: Referring Provider: Treating Provider/Extender: RO BSO N, [name], [name] in Treatment: 10 Subjective History of Present  Illness (HPI) 11/18/2022 [name] is a 67 year old male with a past medical history of nerve conduction deafness that presents to the clinic for a 7 month history of nonhealing ulcers to the right lower extremity. He states these happened spontaneously and have progressively gotten worse. The more proximal wound has been present for 7 months and the most recent wound on the top of the foot/distal leg has been present for the past month. He has 5 wounds total. He visited the ED on 11/04/2022 for this issue. He was given cephalexin for which he completed. He has been using Several ointments at different times to address this issue. These ointments include Silvadene, mupirocin and clobetasol.. At times he keeps the wounds open to air. He also states he will go in the pool with the wounds exposed. For work he is on his feet for long periods of time. He does not wear compression stockings. ABIs in office were 0.8. He currently denies signs of infection. 8/28; patient presents for follow-up. He has been using Vashe wet-to-dry dressings to the wound beds. Patient's wound culture showed Pseudomonas aeruginosa sensitive to ciprofloxacin and this was sent into the pharmacy. Also recommended Keystone antibiotic ointment And this has already been ordered. He has not received this yet. He had a skin biopsy done as well that showed fibrosis inflammation. This was nonspecific. Patient declined debridement due to chronic pain. 9/4; patient presents for follow-up. He has been using Vashe wet-to-dry dressings to the wound beds. He obtains the [facility] antibiotic ointment tomorrow. He has 1 day left of his oral antibiotics. There has been improvement in size since last clinic visit to the wound beds. He currently denies systemic signs of infection. 9/11; patient presents for follow-up. He has been using Keystone antibiotic ointment to the wound beds. He has tolerated this treatment well. Wound measurements are  stable however periwound appears less irritated. 9/18; patient using Keystone and the ABD pad and Tubigrip. Unfortunately the Keystone cream coalesced over the surface of the wounds requiring an aggressive debridement to remove it. Under the [facility] the surface of the wound was not viable requiring further aggressive debridement. The patient has 4 wounds on the right lateral leg which are venous in etiology  stasis dermatitis they have coalesced now into 3 wounds. We have been using Sorbact and hydrogel. He was denied for Apligraf  and we are still waiting to hear about TheraSkin although this may be a class the denile. He seemed to be doing well at the beginning of the month after putting TCA in the 4- layer compression on him however he now seems to have plateaued. 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 His wife is asking about changing this at home again when we were using Tubigrip's. His arterial studies were previously reasonably unremarkable. Electronic Signature(s) Signed: 01/27/2023 4:47:46 PM By: Baltazar Najjar MD Entered By: Baltazar Najjar on 01/27/2023 05:36:28 Haskett, [name] ([id number]) [id number] Page 3 of 8 -------------------------------------------------------------------------------- Physical Exam Details Patient Name: Date of Service: [name], [name] 01/27/2023 7:45 A M Medical Record Number: [id number] Patient Account Number: [id number] Date of Birth/Sex: Treating RN: 10/21/1955 (67 y.o. [name] Primary Care Provider: Saralyn Pilar Other Clinician: Referring Provider: Treating Provider/Extender: RO BSO N, [name], [name] in Treatment: 10 Constitutional Sitting or standing Blood Pressure is within target range for patient.. Pulse regular and within target range for patient.Marland Kitchen Respirations regular, non-labored and within target range.. Temperature is normal and within the target range for the patient.Marland Kitchen appears in no distress. Notes Wound exam; we have now coalesced into 3 wounds 1 large 1 and 2 smaller areas. He had a yellowish-brown drainage. Difficult debridement again with a #5 curette. Hemostasis with direct pressure removing a large amount of fibrinous slough and subcutaneous tissue. There is no surrounding erythema Electronic Signature(s) Signed: 01/27/2023 4:47:46 PM By: Baltazar Najjar MD Entered By: Baltazar Najjar on 01/27/2023  05:37:18 -------------------------------------------------------------------------------- Physician Orders Details Patient Name: Date of Service: [name], [name] 01/27/2023 7:45 A M Medical Record Number: [id number] Patient Account Number: [id number] Date of Birth/Sex: Treating RN: 21-Feb-1956 (67 y.o. [name] Primary Care Provider: Saralyn Pilar Other Clinician: Referring Provider: Treating Provider/Extender: RO BSO Dorris Carnes, [name], [name] in Treatment: 10 The following information was scribed by: Midge Aver The information was scribed for: [name] Verbal / Phone Orders: No Diagnosis Coding Follow-up Appointments Return Appointment in 1 week. Bathing/ Shower/ Hygiene May shower with wound dressing protected with water repellent cover or cast protector. No tub bath. Anesthetic (Use 'Patient Medications' Section for Anesthetic Order Entry) Lidocaine applied to wound bed Wound Treatment Wound #1 - Lower Leg Wound Laterality: Right, Lateral Cleanser: Byram Ancillary Kit - 15 Day Supply (Generic) Every Other Day/30 Days Discharge Instructions: Use supplies as instructed; Kit contains: (15) Saline Bullets; (15) 3x3 Gauze; 15 pr Gloves Iddings, Elizandro M ([id number]) [id number] Page 4 of 8 Cleanser: Vashe 5.8 (oz) Every Other Day/30 Days Discharge Instructions: Use vashe 5.8 (oz) as directed Topical: Santyl Collagenase Ointment, 30 (gm), tube Every Other Day/30 Days Discharge Instructions: apply nickel thick to wound bed only Topical: Triamcinolone Acetonide Cream, 0.1%, 15 (g) tube Every Other Day/30 Days Discharge Instructions: Apply as directed by provider. Prim Dressing: Silvercel 4 1/4x 4 1/4 (in/in) Every Other Day/30 Days ary Discharge Instructions: Apply Silvercel 4 1/4x 4 1/4 (in/in) as instructed Secondary Dressing: ABD Pad 5x9 (in/in) (Generic) Every Other Day/30 Days Discharge Instructions: Cover with  ABD pad Compression Wrap: Urgo K2, two layer compression system, regular Every Other Day/30 Days Electronic Signature(s) Signed: 01/27/2023 4:47:46 PM By: Baltazar Najjar MD Signed: 01/27/2023 5:05:03 PM By: Midge Aver MSN RN  CNS WTA Entered By: Midge Aver on 01/27/2023 05:28:09 -------------------------------------------------------------------------------- Problem List Details Patient Name: Date of Service: [name], [name] 01/27/2023 7:45 A M Medical Record Number: [id number] Patient Account Number: [id number] Date of Birth/Sex: Treating RN: 1955/12/24 (67 y.o. 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 [name] Primary Care Provider: Saralyn Pilar Other Clinician: Referring Provider: Treating Provider/Extender: RO BSO N, [name], [name] in Treatment: 10 Subjective History of Present  Illness (HPI) 11/18/2022 [name] is a 67 year old male with a past medical history of nerve conduction deafness that presents to the clinic for a 7 month history of nonhealing ulcers to the right lower extremity. He states these happened spontaneously and have progressively gotten worse. The more proximal wound has been present for 7 months and the most recent wound on the top of the foot/distal leg has been present for the past month. He has 5 wounds total. He visited the ED on 11/04/2022 for this issue. He was given cephalexin for which he completed. He has been using Several ointments at different times to address this issue. These ointments include Silvadene, mupirocin and clobetasol.. At times he keeps the wounds open to air. He also states he will go in the pool with the wounds exposed. For work he is on his feet for long periods of time. He does not wear compression stockings. ABIs in office were 0.8. He currently denies signs of infection. 8/28; patient presents for follow-up. He has been using Vashe wet-to-dry dressings to the wound beds. Patient's wound culture showed Pseudomonas aeruginosa sensitive to ciprofloxacin and this was sent into the pharmacy. Also recommended Keystone antibiotic ointment And this has already been ordered. He has not received this yet. He had a skin biopsy done as well that showed fibrosis inflammation. This was nonspecific. Patient declined debridement due to chronic pain. 9/4; patient presents for follow-up. He has been using Vashe wet-to-dry dressings to the wound beds. He obtains the [facility] antibiotic ointment tomorrow. He has 1 day left of his oral antibiotics. There has been improvement in size since last clinic visit to the wound beds. He currently denies systemic signs of infection. 9/11; patient presents for follow-up. He has been using Keystone antibiotic ointment to the wound beds. He has tolerated this treatment well. Wound measurements are  stable however periwound appears less irritated. 9/18; patient using Keystone and the ABD pad and Tubigrip. Unfortunately the Keystone cream coalesced over the surface of the wounds requiring an aggressive debridement to remove it. Under the [facility] the surface of the wound was not viable requiring further aggressive debridement. The patient has 4 wounds on the right lateral leg which are venous in etiology  stasis dermatitis they have coalesced now into 3 wounds. We have been using Sorbact and hydrogel. He was denied for Apligraf  and we are still waiting to hear about TheraSkin although this may be a class the denile. He seemed to be doing well at the beginning of the month after putting TCA in the 4- layer compression on him however he now seems to have plateaued. 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 His wife is asking about changing this at home again when we were using Tubigrip's. His arterial studies were previously reasonably unremarkable. Electronic Signature(s) Signed: 01/27/2023 4:47:46 PM By: Baltazar Najjar MD Entered By: Baltazar Najjar on 01/27/2023 05:36:28 Haskett, [name] ([id number]) [id number] Page 3 of 8 -------------------------------------------------------------------------------- Physical Exam Details Patient Name: Date of Service: [name], [name] 01/27/2023 7:45 A M Medical Record Number: [id number] Patient Account Number: [id number] Date of Birth/Sex: Treating RN: 10/21/1955 (67 y.o. [name] Primary Care Provider: Saralyn Pilar Other Clinician: Referring Provider: Treating Provider/Extender: RO BSO N, [name], [name] in Treatment: 10 Constitutional Sitting or standing Blood Pressure is within target range for patient.. Pulse regular and within target range for patient.Marland Kitchen Respirations regular, non-labored and within target range.. Temperature is normal and within the target range for the patient.Marland Kitchen appears in no distress. Notes Wound exam; we have now coalesced into 3 wounds 1 large 1 and 2 smaller areas. He had a yellowish-brown drainage. Difficult debridement again with a #5 curette. Hemostasis with direct pressure removing a large amount of fibrinous slough and subcutaneous tissue. There is no surrounding erythema Electronic Signature(s) Signed: 01/27/2023 4:47:46 PM By: Baltazar Najjar MD Entered By: Baltazar Najjar on 01/27/2023  05:37:18 -------------------------------------------------------------------------------- Physician Orders Details Patient Name: Date of Service: [name], [name] 01/27/2023 7:45 A M Medical Record Number: [id number] Patient Account Number: [id number] Date of Birth/Sex: Treating RN: 21-Feb-1956 (67 y.o. [name] Primary Care Provider: Saralyn Pilar Other Clinician: Referring Provider: Treating Provider/Extender: RO BSO Dorris Carnes, [name], [name] in Treatment: 10 The following information was scribed by: Midge Aver The information was scribed for: [name] Verbal / Phone Orders: No Diagnosis Coding Follow-up Appointments Return Appointment in 1 week. Bathing/ Shower/ Hygiene May shower with wound dressing protected with water repellent cover or cast protector. No tub bath. Anesthetic (Use 'Patient Medications' Section for Anesthetic Order Entry) Lidocaine applied to wound bed Wound Treatment Wound #1 - Lower Leg Wound Laterality: Right, Lateral Cleanser: Byram Ancillary Kit - 15 Day Supply (Generic) Every Other Day/30 Days Discharge Instructions: Use supplies as instructed; Kit contains: (15) Saline Bullets; (15) 3x3 Gauze; 15 pr Gloves Iddings, Elizandro M ([id number]) [id number] Page 4 of 8 Cleanser: Vashe 5.8 (oz) Every Other Day/30 Days Discharge Instructions: Use vashe 5.8 (oz) as directed Topical: Santyl Collagenase Ointment, 30 (gm), tube Every Other Day/30 Days Discharge Instructions: apply nickel thick to wound bed only Topical: Triamcinolone Acetonide Cream, 0.1%, 15 (g) tube Every Other Day/30 Days Discharge Instructions: Apply as directed by provider. Prim Dressing: Silvercel 4 1/4x 4 1/4 (in/in) Every Other Day/30 Days ary Discharge Instructions: Apply Silvercel 4 1/4x 4 1/4 (in/in) as instructed Secondary Dressing: ABD Pad 5x9 (in/in) (Generic) Every Other Day/30 Days Discharge Instructions: Cover with  ABD pad Compression Wrap: Urgo K2, two layer compression system, regular Every Other Day/30 Days Electronic Signature(s) Signed: 01/27/2023 4:47:46 PM By: Baltazar Najjar MD Signed: 01/27/2023 5:05:03 PM By: Midge Aver MSN RN  CLENT, LAFON ([id number]) [id number] Page 1 of 8 Visit Report for 01/27/2023 Debridement Details Patient Name: Date of Service: [name], [name] 01/27/2023 7:45 A M Medical Record Number: [id number] Patient Account Number: [id number] Date of Birth/Sex: Treating RN: 05-14-1955 (67 y.o. [name] Primary Care Provider: Saralyn Pilar Other Clinician: Referring Provider: Treating Provider/Extender: RO BSO Dorris Carnes, [name], [name] in Treatment: 10 Debridement Performed for Assessment: Wound #1 Right,Lateral Lower Leg Performed By: Physician [name], MD Debridement Type: Debridement Level of Consciousness (Pre-procedure): Awake and Alert Pre-procedure Verification/Time Out Yes - 08:16 Taken: Start Time: 08:16 Pain Control: Lidocaine 4% T opical Solution Percent of Wound Bed Debrided: 100% T Area Debrided (cm): otal 47.49 Tissue and other material debrided: Viable, Non-Viable, Slough, Subcutaneous, Slough Level: Skin/Subcutaneous Tissue Debridement Description: Excisional Instrument: Curette Bleeding: Moderate Hemostasis Achieved: Pressure Procedural Pain: 5 Post Procedural Pain: 5 Response to Treatment: Procedure was tolerated well Level of Consciousness (Post- Awake and Alert procedure): Post Debridement Measurements of Total Wound Length: (cm) 11 Width: (cm) 5.5 Depth: (cm) 0.5 Volume: (cm) 23.758 Character of Wound/Ulcer Post Debridement: Stable Post Procedure Diagnosis Same as Pre-procedure Electronic Signature(s) Signed: 01/27/2023 4:47:46 PM By: Baltazar Najjar MD Signed: 01/27/2023 5:05:03 PM By: Midge Aver MSN RN CNS WTA Entered By: Midge Aver on 01/27/2023 05:20:22 Alatorre, Taytum M ([id number]) [id number] Page 2 of 8 -------------------------------------------------------------------------------- HPI Details Patient Name: Date of Service: [name], [name] 01/27/2023  7:45 A M Medical Record Number: [id number] Patient Account Number: [id number] Date of Birth/Sex: Treating RN: 03-15-1956 (67 y.o. [name] Primary Care Provider: Saralyn Pilar Other Clinician: Referring Provider: Treating Provider/Extender: RO BSO N, [name], [name] in Treatment: 10 History of Present Illness HPI Description: 11/18/2022 Mr. Barclay Look is a 67 year old male with a past medical history of nerve conduction deafness that presents to the clinic for a 7 month history of nonhealing ulcers to the right lower extremity. He states these happened spontaneously and have progressively gotten worse. The more proximal wound has been present for 7 months and the most recent wound on the top of the foot/distal leg has been present for the past month. He has 5 wounds total. He visited the ED on 11/04/2022 for this issue. He was given cephalexin for which he completed. He has been using Several ointments at different times to address this issue. These ointments include Silvadene, mupirocin and clobetasol.. At times he keeps the wounds open to air. He also states he will go in the pool with the wounds exposed. For work he is on his feet for long periods of time. He does not wear compression stockings. ABIs in office were 0.8. He currently denies signs of infection. 8/28; patient presents for follow-up. He has been using Vashe wet-to-dry dressings to the wound beds. Patient's wound culture showed Pseudomonas aeruginosa sensitive to ciprofloxacin and this was sent into the pharmacy. Also recommended Keystone antibiotic ointment And this has already been ordered. He has not received this yet. He had a skin biopsy done as well that showed fibrosis inflammation. This was nonspecific. Patient declined debridement due to chronic pain. 9/4; patient presents for follow-up. He has been using Vashe wet-to-dry dressings to the wound beds. He obtains the [facility] antibiotic  ointment tomorrow. He has 1 day left of his oral antibiotics. There has been improvement in size since last clinic visit to the wound beds. He currently denies systemic signs of infection. 9/11; patient presents for

## 2023-02-02 ENCOUNTER — Encounter (INDEPENDENT_AMBULATORY_CARE_PROVIDER_SITE_OTHER): Payer: Self-pay | Admitting: Nurse Practitioner

## 2023-02-02 ENCOUNTER — Ambulatory Visit (INDEPENDENT_AMBULATORY_CARE_PROVIDER_SITE_OTHER): Payer: BC Managed Care – PPO | Admitting: Nurse Practitioner

## 2023-02-02 VITALS — BP 146/81 | HR 60 | Resp 16 | Wt 169.8 lb

## 2023-02-02 DIAGNOSIS — I83012 Varicose veins of right lower extremity with ulcer of calf: Secondary | ICD-10-CM | POA: Diagnosis not present

## 2023-02-02 DIAGNOSIS — I129 Hypertensive chronic kidney disease with stage 1 through stage 4 chronic kidney disease, or unspecified chronic kidney disease: Secondary | ICD-10-CM

## 2023-02-02 DIAGNOSIS — L97218 Non-pressure chronic ulcer of right calf with other specified severity: Secondary | ICD-10-CM

## 2023-02-02 DIAGNOSIS — N183 Chronic kidney disease, stage 3 unspecified: Secondary | ICD-10-CM | POA: Diagnosis not present

## 2023-02-03 ENCOUNTER — Encounter: Payer: BC Managed Care – PPO | Attending: Physician Assistant | Admitting: Physician Assistant

## 2023-02-03 ENCOUNTER — Encounter (INDEPENDENT_AMBULATORY_CARE_PROVIDER_SITE_OTHER): Payer: Self-pay | Admitting: Nurse Practitioner

## 2023-02-03 DIAGNOSIS — I10 Essential (primary) hypertension: Secondary | ICD-10-CM | POA: Diagnosis not present

## 2023-02-03 DIAGNOSIS — G8929 Other chronic pain: Secondary | ICD-10-CM | POA: Insufficient documentation

## 2023-02-03 DIAGNOSIS — L97818 Non-pressure chronic ulcer of other part of right lower leg with other specified severity: Secondary | ICD-10-CM | POA: Insufficient documentation

## 2023-02-03 DIAGNOSIS — I872 Venous insufficiency (chronic) (peripheral): Secondary | ICD-10-CM | POA: Diagnosis not present

## 2023-02-03 DIAGNOSIS — S81801A Unspecified open wound, right lower leg, initial encounter: Secondary | ICD-10-CM | POA: Diagnosis not present

## 2023-02-03 DIAGNOSIS — I87311 Chronic venous hypertension (idiopathic) with ulcer of right lower extremity: Secondary | ICD-10-CM | POA: Diagnosis not present

## 2023-02-03 DIAGNOSIS — L97819 Non-pressure chronic ulcer of other part of right lower leg with unspecified severity: Secondary | ICD-10-CM | POA: Diagnosis not present

## 2023-02-03 NOTE — Progress Notes (Signed)
[name] ([id number]) [id number] Page 1 of 8 Visit Report for 02/03/2023 Chief Complaint Document Details Patient Name: Date of Service: [name], [name]. 02/03/2023 7:45 [name] Medical Record Number: [id number] Patient Account Number: [id number] Date of Birth/Sex: Treating RN: December 19, 1955 (67 y.o. [name] Primary Care Provider: Saralyn Pilar Other Clinician: Referring Provider: Treating Provider/Extender: Tiburcio Pea in Treatment: 11 Information Obtained from: Patient Chief Complaint Right lower extremity ulcer Electronic Signature(s) Signed: 02/03/2023 8:08:42 AM By: Allen Derry PA-C Previous Signature: 02/03/2023 7:49:31 AM Version By: Allen Derry PA-C Entered By: Allen Derry on 02/03/2023 78:29:56 -------------------------------------------------------------------------------- Debridement Details Patient Name: Date of Service: [name], [name]. 02/03/2023 7:45 [name] Medical Record Number: [id number] Patient Account Number: [id number] Date of Birth/Sex: Treating RN: [birth year] (67 y.o. [name] Primary Care Provider: Saralyn Pilar Other Clinician: Referring Provider: Treating Provider/Extender: Tiburcio Pea in Treatment: 11 Debridement Performed for Assessment: Wound #1 Right,Lateral Lower Leg Performed By: Physician Allen Derry, PA-C Debridement Type: Debridement Level of Consciousness (Pre-procedure): Awake and Alert Pre-procedure Verification/Time Out Yes - 08:22 Taken: Start Time: 08:22 Pain Control: Lidocaine 4% T opical Solution Percent of Wound Bed Debrided: 100% T Area Debrided (cm): otal 35.33 Tissue and other material debrided: Viable, Non-Viable, Slough, Subcutaneous, Slough Level: Skin/Subcutaneous Tissue Debridement Description: Excisional Instrument: Curette Bleeding: Moderate Hemostasis Achieved: Pressure Procedural Pain: 4 Post Procedural Pain:  4 [name], [name] ([id number]) [id number] Page 2 of 8 Response to Treatment: Procedure was tolerated well Level of Consciousness (Post- Awake and Alert procedure): Post Debridement Measurements of Total Wound Length: (cm) 9 Width: (cm) 5 Depth: (cm) 0.5 Volume: (cm) 17.671 Character of Wound/Ulcer Post Debridement: Stable Post Procedure Diagnosis Same as Pre-procedure Electronic Signature(s) Signed: 02/04/2023 11:25:01 AM By: Allen Derry PA-C Signed: 02/04/2023 4:24:45 PM By: Midge Aver MSN RN CNS WTA Entered By: Midge Aver on 02/03/2023 08:24:35 -------------------------------------------------------------------------------- HPI Details Patient Name: Date of Service: [name], [name]. 02/03/2023 7:45 [name] Medical Record Number: [id number] Patient Account Number: [id number] Date of Birth/Sex: Treating RN: 28-Nov-1955 (67 y.o. [name] Primary Care Provider: Saralyn Pilar Other Clinician: Referring Provider: Treating Provider/Extender: Tiburcio Pea in Treatment: 11 History of Present Illness HPI Description: 11/18/2022 [name] is a 67 year old male with a past medical history of nerve conduction deafness that presents to the clinic for a 7 month history of nonhealing ulcers to the right lower extremity. He states these happened spontaneously and have progressively gotten worse. The more proximal wound has been present for 7 months and the most recent wound on the top of the foot/distal leg has been present for the past month. He has 5 wounds total. He visited the ED on 11/04/2022 for this issue. He was given cephalexin for which he completed. He has been using Several ointments at different times to address this issue. These ointments include Silvadene, mupirocin and clobetasol.. At times he keeps the wounds open to air. He also states he will go in the pool with the wounds exposed. For work he is on his feet  for long periods of time. He does not wear compression stockings. ABIs in office were 0.8. He currently denies signs of infection. 8/28; patient presents for follow-up. He has been using Vashe wet-to-dry dressings to the wound beds. Patient's wound culture showed Pseudomonas aeruginosa sensitive to ciprofloxacin and this was sent into the pharmacy. Also recommended Keystone antibiotic ointment And this has already been ordered.  He has not received this yet. He had a skin biopsy done as well that showed fibrosis inflammation. This was nonspecific. Patient declined debridement due to chronic pain. 9/4; patient presents for follow-up. He has been using Vashe wet-to-dry dressings to the wound beds. He obtains the [facility] antibiotic ointment tomorrow. He has 1 day left of his oral antibiotics. There has been improvement in size since last clinic visit to the wound beds. He currently denies systemic signs of infection. 9/11; patient presents for follow-up. He has been using Keystone antibiotic ointment to the wound beds. He has tolerated this treatment well. Wound measurements are stable however periwound appears less irritated. 9/18; patient using Keystone and the ABD pad and Tubigrip. Unfortunately the Keystone cream coalesced over the surface of the wounds requiring an aggressive debridement to remove it. Under the [facility] the surface of the wound was not viable requiring further aggressive debridement. The patient has 4 wounds on the right lateral leg which are venous in etiology and a clustered localized area 9/25; this patient has a difficult cluster of wounds on the right lateral lower leg and ankle. Very painful. We have been using Keystone, Hydrofera Blue and Tubigrip compression. His ABI is 0.8 in this clinic. As far as I can see he is not seen vein and vascular for reflux studies. A biopsy was done in our clinic that just showed scar tissue no other identifiable pathology. 10/2; this patient  has a cluster of wounds on the right lateral lower leg and ankle. He has surrounding stasis dermatitis no doubt chronic venous insufficiency with hemosiderin deposition. I have also been concerned about the difficulty feeling his posterior tibial and dorsalis pedis pulses although his ABI in the clinic was 0.8. He definitely has arterial studies on Thursday, I thought he might have venous reflux studies at the same time if not he is going to need these [name]. Likely will need to see vascular surgery as well. We have been using [facility] ABDs and Tubigrip x 2 10/9 the patient is gone for his vascular testing. Fortunately the arterial studies were fairly normal although his ABI was noncompressible at 1.32 on the right is TBI at 0.92 is normal. Triphasic and biphasic waveforms. On the left ABI was normal triphasic waveforms and normal TBI in terms of the venous reflux studies [name], [name] ([id number]) [id number] Page 3 of 8 surprisingly benign. He had no evidence of a DVT no superficial throat thrombosis. He did have venous reflux noted in the right greater saphenous vein in the calf although this is not the drainage area where his current wounds are on the right lateral calf. There was no reflux in the small saphenous vein. We are using Sorbact and hydrogel as the primary dressing also using Keystone. 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 Wounds are better this week and he tolerated the Urgo K2 compression well set it was comfortable and had no issues with it. Wound surfaces are better and there is less surrounding erythema around this cluster of difficult wounds on the right lateral ankle and lower leg 10/23; appointment with the vascular  surgery on November 5. Question is would he benefit from a greater saphenous vein ablation with regards to helping these wounds heal. I was hoping  for a better outcome today however he has a yellowish gritty surface requiring debridement. We have been using Sorbact hydrogel. He was denied for Apligraf we will put TheraSkin through his [employer] based [place] [name]. Not sure if this is a class the mile or not. He has not had any pain and says things feel better. He is tolerating the full 4-layer equivalent compression 10/30; appointment with vascular surgery on November 5. He comes in today with a fair amount of drainage here. He had 4 wounds in this area with significant surrounding stasis dermatitis they have coalesced now into 3 wounds. We have been using Sorbact and hydrogel. He was denied for Apligraf and we are still waiting to hear about TheraSkin although this may be a class the denile. He seemed to be doing well at the beginning of the month after putting TCA in the 4- layer compression on him however he now seems to have plateaued. 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 His wife is asking about changing this at home again when we were using Tubigrip's. His arterial studies were previously reasonably unremarkable. 02-03-2023 upon evaluation today patient appears to be doing pretty well currently in regard to his wound. He actually did see vascular yesterday and they actually discussed a venous ablation surgery for him. I do think that this will be appropriate and I think it would help him as well. Fortunately I do not see any signs of active infection locally or systemically which is good news. There is some need for some sharp debridement but in general he seems to be making good progress here. Electronic Signature(s) Signed: 02/03/2023 10:06:14 AM By: Allen Derry PA-C Entered By: Allen Derry on 02/03/2023  10:06:13 -------------------------------------------------------------------------------- Physical Exam Details Patient Name: Date of Service: [name], [name]. 02/03/2023 7:45 [name] Medical Record Number: [id number] Patient Account Number: [id number] Date of Birth/Sex: Treating RN: 10-06-1955 (67 y.o. [name] Primary Care Provider: Saralyn Pilar Other Clinician: Referring Provider: Treating Provider/Extender: Tiburcio Pea in Treatment: 11 Constitutional Well-nourished and well-hydrated in no acute distress. Respiratory normal breathing without difficulty. Psychiatric this patient is able to make decisions and demonstrates good insight into disease process. Alert and Oriented x 3. pleasant and cooperative. Notes Upon inspection patient's wound bed actually showed signs of good granulation and epithelization at this point. Fortunately I do not see any signs of worsening overall and I do believe that the patient is making really good headway here towards closure. I am extremely pleased with where we stand. I did perform debridement postdebridement the wound bed looks better I think that he is moving in the right direction. Electronic Signature(s) Signed: 02/03/2023 10:06:42 AM By: Allen Derry PA-C Entered By: Allen Derry on 02/03/2023 10:06:42 Caison, [name] ([id number]) [id number] Page 4 of 8 -------------------------------------------------------------------------------- Physician Orders Details Patient Name: Date of Service: [name], [name]. 02/03/2023 7:45 [name] Medical Record Number: [id number] Patient Account Number: [id number] Date of Birth/Sex: Treating RN: 1955-10-26 (67 y.o. 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 Bathing/ Shower/ Hygiene May shower with wound dressing protected with water repellent cover or cast protector. No tub bath. Anesthetic (Use 'Patient Medications' Section for Anesthetic Order Entry) Lidocaine applied to wound bed Wound Treatment Wound #1 - Lower Leg Wound Laterality: Right, Lateral Cleanser: Byram Ancillary Kit - 15 Day Supply (Generic) Every Other Day/30 Days Discharge Instructions: Use supplies as instructed; Kit contains: (15) Saline Bullets; (15) 3x3 Gauze; 15 pr Gloves Cleanser: Vashe 5.8 (oz) Every Other Day/30 Days Discharge Instructions: Use vashe 5.8 (oz) as directed Topical: Santyl Collagenase Ointment, 30 (gm), tube Every Other Day/30 Days Discharge Instructions: apply nickel thick to wound bed only Prim Dressing: Silvercel 4 1/4x 4 1/4 (in/in) Every Other Day/30 Days ary Discharge Instructions: Apply Silvercel 4 1/4x 4 1/4 (in/in) as instructed Secondary Dressing: ABD Pad 5x9 (in/in) (Generic) Every Other Day/30 Days Discharge Instructions: Cover with ABD pad Compression Wrap: Urgo K2, two layer compression system, regular Every Other Day/30 Days Electronic Signature(s) Signed: 02/04/2023 11:25:01 AM By: Allen Derry PA-C Signed: 02/04/2023 4:24:45 PM By: Midge Aver MSN RN CNS WTA Entered By: Midge Aver on 02/03/2023 08:45:19 Ancrum, [name] ([id number]) [id number] Page 5 of 8 -------------------------------------------------------------------------------- Problem List Details Patient Name: Date of  Service: [name], [name]. 02/03/2023 7:45 [name] Medical Record Number: [id number] Patient Account Number: [id number] Date of Birth/Sex: Treating RN: July 24, 1955 (67 y.o. 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 02/03/2023 7:45 [name] Medical Record Number: [id number] Patient Account Number: [id number] Date of Birth/Sex: Treating RN: [birth year] (67 y.o. [name] Primary Care Provider: Saralyn Pilar Other Clinician: Referring Provider: Treating Provider/Extender: Tiburcio Pea in Treatment: [address] Subjective Chief Complaint [name], [name] ([id number]) [id number] Page 6 of 8 Information obtained from Patient Right lower extremity ulcer History of Present Illness (HPI) 11/18/2022 [name] is a 67 year old male with a past medical history of nerve conduction deafness that presents to the clinic for a 7 month history of nonhealing ulcers to the right lower extremity. He states these happened spontaneously and have progressively gotten worse. The more proximal wound has been present for 7 months and the  most recent wound on the top of the foot/distal leg has been present for the past month. He has 5 wounds total. He visited the ED on 11/04/2022 for this issue. He was given cephalexin for which he completed. He has been using Several ointments at different times to address this issue. These ointments include Silvadene, mupirocin and clobetasol.. At times he keeps the wounds open to air. He also states he will go in the pool with the wounds exposed. For work he is on his feet for long periods of time. He does not wear compression stockings. ABIs in office were 0.8. He currently denies signs of infection. 8/28; patient presents for follow-up.  He has been using Vashe wet-to-dry dressings to the wound beds. Patient's wound culture showed Pseudomonas aeruginosa sensitive to ciprofloxacin and this was sent into the pharmacy. Also recommended Keystone antibiotic ointment And this has already been ordered. He has not received this yet. He had a skin biopsy done as well that showed fibrosis inflammation. This was nonspecific. Patient declined debridement due to chronic pain. 9/4; patient presents for follow-up. He has been using Vashe wet-to-dry dressings to the wound beds. He obtains the [facility] antibiotic ointment tomorrow. He has 1 day left of his oral antibiotics. There has been improvement in size since last clinic visit to the wound beds. He currently denies systemic signs of infection. 9/11; patient presents for follow-up. He has been using Keystone antibiotic ointment to the wound beds. He has tolerated this treatment well. Wound measurements are stable however periwound appears less irritated. 9/18; patient using Keystone and the ABD pad and Tubigrip. Unfortunately the Keystone cream coalesced over the surface of the wounds requiring an aggressive debridement to remove it. Under the [facility] the surface of the wound was not viable requiring further aggressive debridement. The patient has 4 wounds on  the right lateral leg which are venous in etiology and a clustered localized area 9/25; this patient has a difficult cluster of wounds on the right lateral lower leg and ankle. Very painful. We have been using Keystone, Hydrofera Blue and Tubigrip compression. His ABI is 0.8 in this clinic. As far as I can see he is not seen vein and vascular for reflux studies. A biopsy was done in our clinic that just showed scar tissue no other identifiable pathology. 10/2; this patient has a cluster of wounds on the right lateral lower leg and ankle. He has surrounding stasis dermatitis no doubt chronic venous insufficiency with hemosiderin deposition. I have also been concerned about the difficulty feeling his posterior tibial and dorsalis pedis pulses although his ABI in the clinic was 0.8. He definitely has arterial studies on Thursday, I thought he might have venous reflux studies at the same time if not he is going to need these [name]. Likely will need to see vascular surgery as well. We have been using [facility] ABDs and Tubigrip x 2 10/9 the patient is gone for his vascular testing. Fortunately the arterial studies were fairly normal although his ABI was noncompressible at 1.32 on the right is TBI at 0.92 is normal. Triphasic and biphasic waveforms. On the left ABI was normal triphasic waveforms and normal TBI in terms of the venous reflux studies surprisingly benign. He had no evidence of a DVT no superficial throat thrombosis. He did have venous reflux noted in the right greater saphenous vein in the calf although this is not the drainage area where his current wounds are on the right lateral calf. There was no reflux in the small saphenous vein. We are using Sorbact and hydrogel as the primary dressing also using Keystone. 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 Wounds are better this week and he tolerated the Urgo K2 compression well set it was comfortable and had no issues with it. Wound surfaces are better and there is less surrounding erythema around this cluster of difficult wounds on the right lateral ankle and lower leg 10/23;  appointment with the vascular surgery on November 5. Question is would he benefit from a greater saphenous vein ablation with regards to helping these wounds heal. I was hoping for a better outcome today however he has a yellowish gritty surface requiring debridement. We have been using Sorbact hydrogel. He was denied for Apligraf we will put TheraSkin through his [employer] based Charles [name]. Not sure if this is a class the mile or not. He has not had any pain and says things feel better. He is tolerating the full 4-layer equivalent compression 10/30; appointment with vascular surgery on November 5. He comes in today with a fair amount of drainage here. He had 4 wounds in this area with significant surrounding stasis dermatitis they have coalesced now into 3 wounds. We have been using Sorbact and hydrogel. He was denied for Apligraf and we are still waiting to hear about TheraSkin although this may be a class the denile. He seemed to be doing well at the beginning of the month after putting TCA in the 4- layer compression on him however he now seems to have plateaued. 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 His wife is asking about changing this at home again when we were using Tubigrip's. His arterial studies were previously reasonably unremarkable. 02-03-2023 upon evaluation today patient appears to be doing pretty well currently in regard to his wound. He actually did see vascular yesterday and they actually discussed a venous ablation surgery for him. I do think that this will be appropriate and I think it  would help him as well. Fortunately I do not see any signs of active infection locally or systemically which is good news. There is some need for some sharp debridement but in general he seems to be making good progress here. Objective Constitutional Well-nourished and well-hydrated in no acute distress. Vitals Time Taken: 7:54 AM, Height: 67 in, Weight: 174 lbs, BMI: 27.2, Temperature: 97.6 F, Pulse: 56 bpm, Respiratory Rate: 18 breaths/min, Blood Pressure: 117/71 mmHg. Respiratory normal breathing without difficulty. Psychiatric this patient is able to make decisions and demonstrates good insight into disease process. Alert and Oriented x 3. pleasant and cooperative. [name], [name] ([id number]) [id number] Page 7 of 8 General Notes: Upon inspection patient's wound bed actually showed signs of good granulation and epithelization at this point. Fortunately I do not see any signs of worsening overall and I do believe that the patient is making really good headway here towards closure. I am extremely pleased with where we stand. I did perform debridement postdebridement the wound bed looks better I think that he is moving in the right direction. Integumentary (Hair, Skin) Wound #1 status is Open. Original cause of wound was Gradually Appeared. The date acquired was: 08/29/2022. The wound has been in treatment 11 weeks. The wound is located on the Right,Lateral Lower Leg. The wound measures 9cm length x 5cm width x 0.5cm depth; 35.343cm^2 area and 17.671cm^3 volume. There is Fat Layer (Subcutaneous Tissue) exposed. There is a medium amount of serosanguineous drainage noted. There is no granulation within the wound bed. There is a large (67-100%) amount of necrotic tissue within the wound bed including Eschar. Assessment Active Problems ICD-10 Chronic venous hypertension (idiopathic) with ulcer of right lower extremity Non-pressure chronic ulcer of other part of right  lower leg with other specified severity Mixed conductive and sensorineural hearing loss, unspecified Procedures Wound #1 Pre-procedure diagnosis of Wound #1 is an Atypical located on the  Right,Lateral Lower Leg . There was a Excisional Skin/Subcutaneous Tissue Debridement with a total area of 35.33 sq cm performed by Allen Derry, PA-C. With the following instrument(s): Curette to remove Viable and Non-Viable tissue/material. Material removed includes Subcutaneous Tissue and Slough and after achieving pain control using Lidocaine 4% T opical Solution. No specimens were taken. A time out was conducted at 08:22, prior to the start of the procedure. A Moderate amount of bleeding was controlled with Pressure. The procedure was tolerated well with a pain level of 4 throughout and a pain level of 4 following the procedure. Post Debridement Measurements: 9cm length x 5cm width x 0.5cm depth; 17.671cm^3 volume. Character of Wound/Ulcer Post Debridement is stable. Post procedure Diagnosis Wound #1: Same as Pre-Procedure Pre-procedure diagnosis of Wound #1 is an Atypical located on the Right,Lateral Lower Leg . There was a Four Layer Compression Therapy Procedure by Midge Aver, RN. Post procedure Diagnosis Wound #1: Same as Pre-Procedure Plan Follow-up Appointments: Return Appointment in 1 week. Bathing/ Shower/ Hygiene: May shower with wound dressing protected with water repellent cover or cast protector. No tub bath. Anesthetic (Use 'Patient Medications' Section for Anesthetic Order Entry): Lidocaine applied to wound bed WOUND #1: - Lower Leg Wound Laterality: Right, Lateral Cleanser: Byram Ancillary Kit - 15 Day Supply (Generic) Every Other Day/30 Days Discharge Instructions: Use supplies as instructed; Kit contains: (15) Saline Bullets; (15) 3x3 Gauze; 15 pr Gloves Cleanser: Vashe 5.8 (oz) Every Other Day/30 Days Discharge Instructions: Use vashe 5.8 (oz) as directed Topical: Santyl  Collagenase Ointment, 30 (gm), tube Every Other Day/30 Days Discharge Instructions: apply nickel thick to wound bed only Prim Dressing: Silvercel 4 1/4x 4 1/4 (in/in) Every Other Day/30 Days ary Discharge Instructions: Apply Silvercel 4 1/4x 4 1/4 (in/in) as instructed Secondary Dressing: ABD Pad 5x9 (in/in) (Generic) Every Other Day/30 Days Discharge Instructions: Cover with ABD pad Com pression Wrap: Urgo K2, two layer compression system, regular Every Other Day/30 Days 1. I would recommend based on what we are seeing that we have the patient going continue to monitor for any signs of infection or worsening. Overall I think that he is making really good headway here towards closure. 2. I am good recommend as well that the patient should continue to utilize the silver alginate dressing followed by ABD pad. 3. We are putting Santyl underneath the alginate which seems to be beneficial at this point. 4. I am also can recommend that we continue with Urgo K2 compression wrap which I think is doing a good job. 5. We did have a pretty lengthy discussion about his venous ablation procedure which I think would be beneficial. He is in agreement with proceeding with that I think they are working on the approval process at the moment through vascular. We will see patient back for reevaluation in 1 week here in the clinic. If anything worsens or changes patient will contact our office for additional recommendations. Electronic Signature(s) Rawlins, [name] ([id number]) [id number] Page 8 of 8 Signed: 02/03/2023 10:07:48 AM By: Allen Derry PA-C Entered By: Allen Derry on 02/03/2023 10:07:48 -------------------------------------------------------------------------------- SuperBill Details Patient Name: Date of Service: [name], Ariyon [name]. 02/03/2023 Medical Record Number: [id number] Patient Account Number: [id number] Date of Birth/Sex: Treating RN: July 14, 1955 (67 y.o. 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, unspecified Facility Procedures : CPT4 Code: [id number] Description: 11042 - DEB SUBQ TISSUE 20 SQ CM/< ICD-10 Diagnosis Description L97.818 Non-pressure chronic ulcer of other part of right lower leg with other specified Modifier: severity Quantity: 1 : CPT4 Code: [id number] Description: 11045 - DEB SUBQ TISS EA ADDL 20CM ICD-10 Diagnosis Description L97.818 Non-pressure chronic ulcer of other part of right lower leg with other specified Modifier: severity Quantity: 1 Physician Procedures : CPT4 Code Description Modifier [id number] 11042 - WC PHYS SUBQ TISS 20 SQ CM ICD-10 Diagnosis Description L97.818 Non-pressure chronic ulcer of other part of right lower leg with other specified severity Quantity: 1 : [id number] 11045 - WC PHYS SUBQ TISS EA ADDL 20 CM ICD-10 Diagnosis Description L97.818 Non-pressure chronic ulcer of other part of right lower leg with other specified severity Quantity: 1 Electronic Signature(s) Signed: 02/03/2023 10:08:02 AM By: Allen Derry PA-C Entered By: Allen Derry on 02/03/2023 10:08:02

## 2023-02-03 NOTE — Progress Notes (Signed)
Subjective:    Patient ID: [name], male    DOB: 1955/08/22, 67 y.o.   MRN: [id number] Chief Complaint  Patient presents with   New Patient (Initial Visit)    Ref Leanord Hawking abi/reflux results    The patient is seen for evaluation of symptomatic varicose veins. The patient relates burning and stinging which worsened steadily throughout the course of the day, particularly with standing. The patient also notes an aching and throbbing pain over the varicosities, particularly with prolonged dependent positions. The symptoms are significantly improved with elevation.  The patient also notes that during hot weather the symptoms are greatly intensified. The patient states the pain from the varicose veins interferes with work, daily exercise, shopping and household maintenance. At this point, the symptoms are persistent and severe enough that they're having a negative impact on lifestyle and are interfering with daily activities.  Additionally the patient has had a venous ulceration that has been treated by wound care for several months.  This is a venous ulceration that has been very slow despite excellent wound care  There is no history of DVT, PE or superficial thrombophlebitis. There is no history of ulceration or hemorrhage. The patient denies a significant family history of varicose veins.  The patient has been in compression wraps for the last several months and attempted to treat the wound. At the present time the patient has been using over-the-counter analgesics. There is no history of prior surgical intervention or sclerotherapy.      Review of Systems  Cardiovascular:  Positive for leg swelling.  All other systems reviewed and are negative.      Objective:   Physical Exam Vitals reviewed.  HENT:     Head: Normocephalic.  Cardiovascular:     Rate and Rhythm: Normal rate.     Pulses:          Dorsalis pedis pulses are detected w/ Doppler on the right side and detected w/  Doppler on the left side.       Posterior tibial pulses are detected w/ Doppler on the right side and detected w/ Doppler on the left side.  Pulmonary:     Effort: Pulmonary effort is normal.  Skin:    General: Skin is warm and dry.  Neurological:     Mental Status: He is alert and oriented to person, place, and time.  Psychiatric:        Mood and Affect: Mood normal.        Behavior: Behavior normal.        Thought Content: Thought content normal.        Judgment: Judgment normal.    BP (!) 146/81 (BP Location: Left Arm)   Pulse 60   Resp 16   Wt 169 lb 12.8 oz (77 kg)   BMI 25.82 kg/m   Past Medical History:  Diagnosis Date   Allergy    Asthma    Chronic kidney disease    GERD (gastroesophageal reflux disease)    Headache    Hypertension    TGA (transient global amnesia)     Social History   Socioeconomic History   Marital status: Married    Spouse name: Not on file   Number of children: Not on file   Years of education: College   Highest education level: Not on file  Occupational History   Not on file  Tobacco Use   Smoking status: Never   Smokeless tobacco: Never  Vaping Use  Vaping status: Never Used  Substance and Sexual Activity   Alcohol use: Yes    Alcohol/week: 2.0 standard drinks of alcohol    Types: 2 Glasses of wine per week    Comment: once a month   Drug use: No   Sexual activity: Not on file  Other Topics Concern   Not on file  Social History Narrative   Not on file   Social Determinants of Health   Financial Resource Strain: Not on file  Food Insecurity: Not on file  Transportation Needs: Not on file  Physical Activity: Not on file  Stress: Not on file  Social Connections: Not on file  Intimate Partner Violence: Not on file    Past Surgical History:  Procedure Laterality Date   COLONOSCOPY WITH PROPOFOL N/A 12/04/2015   Procedure: COLONOSCOPY WITH PROPOFOL;  Surgeon: Scot Jun, MD;  Location: [facility] ENDOSCOPY;  Service:  Endoscopy;  Laterality: N/A;   LITHOTRIPSY     URETEROSCOPY      Family History  Problem Relation Age of Onset   COPD Mother    CAD Mother    Alzheimer's disease Father    Diabetes Father     Allergies  Allergen Reactions   Seasonal Ic [Cholestatin]        Latest Ref Rng & Units 07/01/2022    8:01 AM 11/25/2020    3:15 PM 05/23/2020    8:00 AM  CBC  WBC 3.8 - 10.8 Thousand/uL 6.5  8.9  6.1   Hemoglobin 13.2 - 17.1 g/dL 13.2  44.0  10.2   Hematocrit 38.5 - 50.0 % 47.9  47.3  46.1   Platelets 140 - 400 Thousand/uL 202  248  210       CMP     Component Value Date/Time   NA 143 07/01/2022 0801   NA 139 04/22/2018 0000   K 4.8 07/01/2022 0801   CL 106 07/01/2022 0801   CO2 28 07/01/2022 0801   GLUCOSE 116 (H) 07/01/2022 0801   BUN 23 07/01/2022 0801   BUN 25 (A) 04/22/2018 0000   CREATININE 1.60 (H) 07/01/2022 0801   CALCIUM 9.5 07/01/2022 0801   PROT 6.8 07/01/2022 0801   ALBUMIN 4.2 08/29/2015 1116   AST 16 07/01/2022 0801   ALT 19 07/01/2022 0801   ALKPHOS 92 08/29/2015 1116   BILITOT 0.5 07/01/2022 0801   EGFR 47 (L) 07/01/2022 0801   GFRNONAA 46 (L) 05/23/2020 0800     VAS [place] ABI WITH/WO TBI  Result Date: 01/04/2023  LOWER EXTREMITY DOPPLER STUDY Patient Name:  TEJVEER MOTA  Date of Exam:   01/01/2023 Medical Rec #: [id number]     Accession #:    [id number] Date of Birth: [birth year]     Patient Gender: M Patient Age:   26 years Exam Location:  [facility] Procedure:      VAS [place] ABI WITH/WO TBI Referring Phys: Festus Barren --------------------------------------------------------------------------------  Indications: Ulceration.  Performing Technologist: Debbe Bales RVS  Examination Guidelines: A complete evaluation includes at minimum, Doppler waveform signals and systolic blood pressure reading at the level of bilateral brachial, anterior tibial, and posterior tibial arteries, when vessel segments are accessible. Bilateral testing is considered an  integral part of a complete examination. Photoelectric Plethysmograph (PPG) waveforms and toe systolic pressure readings are included as required and additional duplex testing as needed. Limited examinations for reoccurring indications may be performed as noted.  ABI Findings: +---------+------------------+-----+---------+--------+ Right    Rt Pressure (mmHg)IndexWaveform  Comment  +---------+------------------+-----+---------+--------+ Brachial 141                                      +---------+------------------+-----+---------+--------+ ATA      177               1.26 triphasic         +---------+------------------+-----+---------+--------+ PTA      186               1.32 biphasic          +---------+------------------+-----+---------+--------+ Great Toe130               0.92 Normal            +---------+------------------+-----+---------+--------+ +---------+------------------+-----+---------+-------+ Left     Lt Pressure (mmHg)IndexWaveform Comment +---------+------------------+-----+---------+-------+ Brachial 140                                     +---------+------------------+-----+---------+-------+ ATA      169               1.20 triphasic        +---------+------------------+-----+---------+-------+ PTA      175               1.24 triphasic        +---------+------------------+-----+---------+-------+ Great Toe148               1.05 Normal           +---------+------------------+-----+---------+-------+ +-------+-----------+-----------+------------+------------+ ABI/TBIToday's ABIToday's TBIPrevious ABIPrevious TBI +-------+-----------+-----------+------------+------------+ Right  1.32       .92                                 +-------+-----------+-----------+------------+------------+ Left   1.24       1.05                                +-------+-----------+-----------+------------+------------+  Summary: Right: Resting right  ankle-brachial index is within normal range. The right toe-brachial index is normal. Left: Resting left ankle-brachial index is within normal range. The left toe-brachial index is normal. *See table(s) above for measurements and observations.  Electronically signed by Festus Barren MD on 01/04/2023 at 9:09:30 AM.    Final        Assessment & Plan:   1. 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 Patient will continue  wearing graduated compression stockings class 1 on a daily basis, beginning first thing in the morning and removing them in the evening.  The patient is CEAP C3sEpAsPr.  The patient has been wearing compression for more than 12 weeks with no or little benefit.  The patient has been exercising daily for more than 12 weeks. The patient has been elevating and taking OTC pain medications for more than 12 weeks.  None of these have have eliminated the pain related to the varicose veins and venous reflux or the discomfort regarding venous congestion.    In addition, behavioral modification including elevation during the day was again discussed and this will continue.  The patient has utilized over  the counter pain medications and has been exercising.  However, at this time conservative therapy has not alleviated the patient's symptoms of leg pain and swelling  Recommend: laser ablation of the right great saphenous veins to eliminate the symptoms of pain and swelling of the lower extremities caused by the severe superficial venous reflux disease.   2. Benign hypertension with CKD (chronic kidney disease) stage III (HCC) Continue antihypertensive medications as already ordered, these medications have been reviewed and there are no changes at this time.   Current Outpatient Medications on File Prior to Visit  Medication Sig  Dispense Refill   albuterol (VENTOLIN HFA) 108 (90 Base) MCG/ACT inhaler Inhale into the lungs every 6 (six) hours as needed for wheezing or shortness of breath.     amLODipine (NORVASC) 5 MG tablet Take 1 tablet (5 mg total) by mouth daily. 90 tablet 3   aspirin EC 81 MG EC tablet Take 1 tablet (81 mg total) by mouth daily. 120 tablet 0   losartan (COZAAR) 50 MG tablet Take 50 mg by mouth daily.     mupirocin ointment (BACTROBAN) 2 % Apply 1 Application topically 2 (two) times daily. For 2 weeks then pause and can repeat, use for skin sore/ulceration 30 g 0   omeprazole (PRILOSEC) 40 MG capsule Take by mouth.     tamsulosin (FLOMAX) 0.4 MG CAPS capsule Take 1 capsule (0.4 mg total) by mouth every evening. 90 capsule 3   No current facility-administered medications on file prior to visit.    There are no Patient Instructions on file for this visit. No follow-ups on file.   Georgiana Spinner, NP

## 2023-02-03 NOTE — Progress Notes (Addendum)
MARK, HASSEY ([id number]) [id number] Page 1 of 9 Visit Report for 02/03/2023 Arrival Information Details Patient Name: Date of Service: AMARE, BAIL. 02/03/2023 7:45 A M Medical Record Number: [id number] Patient Account Number: [id number] Date of Birth/Sex: Treating RN: 09-Jul-1955 (67 y.o. [name] Primary Care Aniston Christman: Saralyn Pilar Other Clinician: Referring Kaylaann Mountz: Treating Roxy Filler/Extender: Tiburcio Pea in Treatment: 11 Visit Information History Since Last Visit Added or deleted any medications: No Patient Arrived: Ambulatory Any new allergies or adverse reactions: No Arrival Time: 07:52 Has Dressing in Place as Prescribed: Yes Accompanied By: wife Has Compression in Place as Prescribed: Yes Transfer Assistance: None Pain Present Now: No Patient Identification Verified: Yes Secondary Verification Process Completed: Yes Patient Requires Transmission-Based Precautions: No Patient Has Alerts: Yes Patient Alerts: R ABI 1.32 L ABI 1.24 R TBI .92 L TBI 1.05 Electronic Signature(s) Signed: 02/04/2023 4:24:45 PM By: Midge Aver MSN RN CNS WTA Entered By: Midge Aver on 02/03/2023 07:53:40 -------------------------------------------------------------------------------- Clinic Level of Care Assessment Details Patient Name: Date of Service: AHMET, SCHANK. 02/03/2023 7:45 A M Medical Record Number: [id number] Patient Account Number: [id number] Date of Birth/Sex: Treating RN: July 14, 1955 (67 y.o. [name] Primary Care Ric Rosenberg: Saralyn Pilar Other Clinician: Referring Demetries Coia: Treating Rowan Blaker/Extender: Tiburcio Pea in Treatment: 11 Clinic Level of Care Assessment Items TOOL 1 Quantity Score []  - 0 Use when EandM and Procedure is performed on INITIAL visit ASSESSMENTS - Nursing Assessment / Reassessment []  - 0 General Physical Exam (combine w/ comprehensive  assessment (listed just below) when performed on new pt. evals) []  - 0 Comprehensive Assessment (HX, ROS, Risk Assessments, Wounds Hx, etc.) ASSESSMENTS - Wound and Skin Assessment / Reassessment []  - 0 Dermatologic / Skin Assessment (not related to wound area) [place], [name] ([id number]) [id number] Page 2 of 9 ASSESSMENTS - Ostomy and/or Continence Assessment and Care []  - 0 Incontinence Assessment and Management []  - 0 Ostomy Care Assessment and Management (repouching, etc.) PROCESS - Coordination of Care []  - 0 Simple Patient / Family Education for ongoing care []  - 0 Complex (extensive) Patient / Family Education for ongoing care []  - 0 Staff obtains Chiropractor, Records, T Results / Process Orders est []  - 0 Staff telephones HHA, Nursing Homes / Clarify orders / etc []  - 0 Routine Transfer to another Facility (non-emergent condition) []  - 0 Routine Hospital Admission (non-emergent condition) []  - 0 New Admissions / Manufacturing engineer / Ordering NPWT Apligraf, etc. , []  - 0 Emergency Hospital Admission (emergent condition) PROCESS - Special Needs []  - 0 Pediatric / Minor Patient Management []  - 0 Isolation Patient Management []  - 0 Hearing / Language / Visual special needs []  - 0 Assessment of Community assistance (transportation, D/C planning, etc.) []  - 0 Additional assistance / Altered mentation []  - 0 Support Surface(s) Assessment (bed, cushion, seat, etc.) INTERVENTIONS - Miscellaneous []  - 0 External ear exam []  - 0 Patient Transfer (multiple staff / Nurse, adult / Similar devices) []  - 0 Simple Staple / Suture removal (25 or less) []  - 0 Complex Staple / Suture removal (26 or more) []  - 0 Hypo/Hyperglycemic Management (do not check if billed separately) []  - 0 Ankle / Brachial Index (ABI) - do not check if billed separately Has the patient been seen at the hospital within the last three years: Yes Total Score: 0 Level Of  Care: ____ Electronic Signature(s) Signed: 02/04/2023 4:24:45 PM By: Midge Aver MSN RN CNS WTA Entered  By: Midge Aver on 02/03/2023 08:45:26 -------------------------------------------------------------------------------- Compression Therapy Details Patient Name: Date of Service: PRECILIANO, CASTELL. 02/03/2023 7:45 A M Medical Record Number: [id number] Patient Account Number: [id number] Date of Birth/Sex: Treating RN: July 22, 1955 (67 y.o. [name] Primary Care Rhydian Baldi: Saralyn Pilar Other Clinician: Referring Kahlin Mark: Treating Salem Jon/Extender: Tiburcio Pea in Treatment: 11 Compression Therapy Performed for Wound Assessment: Wound #1 Right,Lateral Lower Leg Performed By: Raj Janus, RN Compression Type: Four Regenia Skeeter ([id number]) [id number] Page 3 of 9 Post Procedure Diagnosis Same as Pre-procedure Electronic Signature(s) Signed: 02/04/2023 4:24:45 PM By: Midge Aver MSN RN CNS WTA Entered By: Midge Aver on 02/03/2023 08:24:59 -------------------------------------------------------------------------------- Encounter Discharge Information Details Patient Name: Date of Service: [name], Sael M. 02/03/2023 7:45 A M Medical Record Number: [id number] Patient Account Number: [id number] Date of Birth/Sex: Treating RN: Aug 15, 1955 (67 y.o. [name] Primary Care Darienne Belleau: Saralyn Pilar Other Clinician: Referring Anay Rathe: Treating Sandie Swayze/Extender: Tiburcio Pea in Treatment: 11 Encounter Discharge Information Items Post Procedure Vitals Discharge Condition: Stable Temperature (F): 97.6 Ambulatory Status: Ambulatory Pulse (bpm): 56 Discharge Destination: Home Respiratory Rate (breaths/min): 18 Transportation: Private Auto Blood Pressure (mmHg): 117/71 Accompanied By: wife Schedule Follow-up Appointment: Yes Clinical Summary of Care: Electronic  Signature(s) Signed: 02/04/2023 4:24:45 PM By: Midge Aver MSN RN CNS WTA Entered By: Midge Aver on 02/03/2023 08:46:51 -------------------------------------------------------------------------------- Lower Extremity Assessment Details Patient Name: Date of Service: [name], Jaiceon M. 02/03/2023 7:45 A M Medical Record Number: [id number] Patient Account Number: [id number] Date of Birth/Sex: Treating RN: 1955/06/16 (67 y.o. [name] Primary Care Selma Mink: Saralyn Pilar Other Clinician: Referring Leshay Desaulniers: Treating Tamla Winkels/Extender: Tiburcio Pea in Treatment: 11 Edema Assessment Assessed: Kyra Searles: No] Franne Forts: No] [Left: Edema] Franne Forts: :] Pearletha [place], [name] ([id number]) [id number] Page 4 of 9 Left: Right: Point of Measurement: 32 cm From Medial Instep 36 cm Ankle Left: Right: Point of Measurement: 11 cm From Medial Instep 29 cm Vascular Assessment Pulses: Dorsalis Pedis Palpable: [Right:Yes] Extremity colors, hair growth, and conditions: Extremity Color: [Right:Hyperpigmented] Hair Growth on Extremity: [Right:No] Temperature of Extremity: [Right:Warm] Capillary Refill: [Right:< 3 seconds] Dependent Rubor: [Right:No] Blanched when Elevated: [Right:No No] Toe Nail Assessment Left: Right: Thick: Yes Discolored: Yes Deformed: Yes Improper Length and Hygiene: Yes Electronic Signature(s) Signed: 02/04/2023 4:24:45 PM By: Midge Aver MSN RN CNS WTA Entered By: Midge Aver on 02/03/2023 08:14:35 -------------------------------------------------------------------------------- Multi Wound Chart Details Patient Name: Date of Service: [name], Arrie M. 02/03/2023 7:45 A M Medical Record Number: [id number] Patient Account Number: [id number] Date of Birth/Sex: Treating RN: [birth year] (68 y.o. [name] Primary Care Reneisha Stilley: Saralyn Pilar Other Clinician: Referring Dante Cooter: Treating Dequan Kindred/Extender:  Tiburcio Pea in Treatment: 11 Vital Signs Height(in): 67 Pulse(bpm): 56 Weight(lbs): 174 Blood Pressure(mmHg): 117/71 Body Mass Index(BMI): 27.2 Temperature(F): 97.6 Respiratory Rate(breaths/min): 18 [1:Photos:] [N/A:N/A] [place], LOPPNOW ([id number]) [1:Right, Lateral Lower Leg Wound Location: Gradually Appeared Wounding Event: Atypical Primary Etiology: Asthma, Hypertension Comorbid History: 08/29/2022 Date Acquired: 11 Weeks of Treatment: Open Wound Status: No Wound Recurrence: Yes Clustered Wound:  9x5x0.5 Measurements L x W x D (cm) 35.343 A (cm) : rea 17.671 Volume (cm) : 62.80% % Reduction in A rea: 53.50% % Reduction in Volume: Full Thickness Without Exposed Classification: Support Structures Medium Exudate A mount: Serosanguineous Exudate  Type: red, brown Exudate Color: None Present (0%) Granulation Amount: Large (67-100%) Necrotic Amount: Eschar Necrotic Tissue: Fat Layer (Subcutaneous Tissue): Yes N/A  Exposed Structures: Fascia: No Tendon: No Muscle: No Joint: No Bone: No None  Epithelialization:] [N/A:N/A N/A N/A N/A N/A N/A N/A N/A N/A N/A N/A N/A N/A N/A N/A N/A N/A N/A N/A N/A N/A N/A] Treatment Notes Electronic Signature(s) Signed: 02/04/2023 4:24:45 PM By: Midge Aver MSN RN CNS WTA Entered By: Midge Aver on 02/03/2023 08:16:10 -------------------------------------------------------------------------------- Multi-Disciplinary Care Plan Details Patient Name: Date of Service: [name], Maclain M. 02/03/2023 7:45 A M Medical Record Number: [id number] Patient Account Number: [id number] Date of Birth/Sex: Treating RN: Aug 10, 1955 (67 y.o. [name] Primary Care Michaeljohn Biss: Saralyn Pilar Other Clinician: Referring Morghan Kester: Treating Luba Matzen/Extender: Tiburcio Pea in Treatment: 11 Active Inactive Necrotic Tissue Nursing Diagnoses: Knowledge deficit related to management of necrotic/devitalized  tissue Goals: Patient/caregiver will verbalize understanding of reason and process for debridement of necrotic tissue Date Initiated: 11/18/2022 Target Resolution Date: 02/18/2023 Goal Status: Active Interventions: Assess patient pain level pre-, during and post procedure and prior to discharge NotesKYREE, ADRIANO ([id number]) [phone].pdf Page 6 of 9 Wound/Skin Impairment Nursing Diagnoses: Knowledge deficit related to ulceration/compromised skin integrity Goals: Patient/caregiver will verbalize understanding of skin care regimen Date Initiated: 11/18/2022 Date Inactivated: 12/30/2022 Target Resolution Date: 12/19/2022 Goal Status: Met Ulcer/skin breakdown will have a volume reduction of 30% by week 4 Date Initiated: 11/18/2022 Date Inactivated: 12/30/2022 Target Resolution Date: 12/19/2022 Goal Status: Met Ulcer/skin breakdown will have a volume reduction of 50% by week 8 Date Initiated: 11/18/2022 Date Inactivated: 01/13/2023 Target Resolution Date: 01/18/2023 Goal Status: Met Ulcer/skin breakdown will have a volume reduction of 80% by week 12 Date Initiated: 11/18/2022 Target Resolution Date: 02/18/2023 Goal Status: Active Ulcer/skin breakdown will heal within 14 weeks Date Initiated: 11/18/2022 Target Resolution Date: 03/20/2023 Goal Status: Active Interventions: Assess patient/caregiver ability to obtain necessary supplies Assess patient/caregiver ability to perform ulcer/skin care regimen upon admission and as needed Assess ulceration(s) every visit Notes: Electronic Signature(s) Signed: 02/04/2023 4:24:45 PM By: Midge Aver MSN RN CNS WTA Entered By: Midge Aver on 02/03/2023 08:45:44 -------------------------------------------------------------------------------- Pain Assessment Details Patient Name: Date of Service: [name], Dereke M. 02/03/2023 7:45 A M Medical Record Number: [id number] Patient Account Number: [id number] Date of Birth/Sex:  Treating RN: [birth year] (67 y.o. [name] Primary Care Thomasene Dubow: Saralyn Pilar Other Clinician: Referring Timmya Blazier: Treating Gen Clagg/Extender: Tiburcio Pea in Treatment: 11 Active Problems Location of Pain Severity and Description of Pain Patient Has Paino No Site Locations [place], [place] ([id number]) [id number] Page 7 of 9 Pain Management and Medication Current Pain Management: Electronic Signature(s) Signed: 02/04/2023 4:24:45 PM By: Midge Aver MSN RN CNS WTA Entered By: Midge Aver on 02/03/2023 07:59:19 -------------------------------------------------------------------------------- Patient/Caregiver Education Details Patient Name: Date of Service: [name] 11/6/2024andnbsp7:45 A M Medical Record Number: [id number] Patient Account Number: [id number] Date of Birth/Gender: Treating RN: 11-18-1955 (67 y.o. [name] Primary Care Physician: Saralyn Pilar Other Clinician: Referring Physician: Treating Physician/Extender: Tiburcio Pea in Treatment: 11 Education Assessment Education Provided To: Patient Education Topics Provided Wound Debridement: Handouts: Wound Debridement Methods: Explain/Verbal Responses: State content correctly Wound/Skin Impairment: Handouts: Caring for Your Ulcer Methods: Explain/Verbal Responses: State content correctly Electronic Signature(s) Signed: 02/04/2023 4:24:45 PM By: Midge Aver MSN RN CNS WTA Entered By: Midge Aver on 02/03/2023 08:46:01 [place], [name] ([id number]) [id number] Page 8 of 9 -------------------------------------------------------------------------------- Wound Assessment Details Patient Name: Date of Service: ASANTE, BLANDA 02/03/2023 7:45 A M Medical Record Number: [id number] Patient Account Number: [id number] Date  of Birth/Sex: Treating RN: [birth year] (67 y.o. [name] Primary Care Angeleen Horney: Saralyn Pilar Other Clinician: Referring Emaad Nanna: Treating Aquilla Shambley/Extender: Tiburcio Pea in Treatment: 11 Wound Status Wound Number: 1 Primary Etiology: Atypical Wound Location: Right, Lateral Lower Leg Wound Status: Open Wounding Event: Gradually Appeared Comorbid History: Asthma, Hypertension Date Acquired: 08/29/2022 Weeks Of Treatment: 11 Clustered Wound: Yes Photos Wound Measurements Length: (cm) 9 Width: (cm) 5 Depth: (cm) 0.5 Area: (cm) 35.343 Volume: (cm) 17.671 % Reduction in Area: 62.8% % Reduction in Volume: 53.5% Epithelialization: None Wound Description Classification: Full Thickness Without Exposed Suppor Exudate Amount: Medium Exudate Type: Serosanguineous Exudate Color: red, brown t Structures Foul Odor After Cleansing: No Slough/Fibrino Yes Wound Bed Granulation Amount: None Present (0%) Exposed Structure Necrotic Amount: Large (67-100%) Fascia Exposed: No Necrotic Quality: Eschar Fat Layer (Subcutaneous Tissue) Exposed: Yes Tendon Exposed: No Muscle Exposed: No Joint Exposed: No Bone Exposed: No Treatment Notes Wound #1 (Lower Leg) Wound Laterality: Right, Lateral Cleanser [place] Ancillary Kit - 15 Day Supply Discharge Instruction: Use supplies as instructed; Kit contains: (15) Saline Bullets; (15) 3x3 Gauze; 15 pr Gloves Vashe 5.8 (oz) Sontag, Shandell M ([id number]) [id number] Page 9 of 9 Discharge Instruction: Use vashe 5.8 (oz) as directed Peri-Wound Care Topical Santyl Collagenase Ointment, 30 (gm), tube Discharge Instruction: apply nickel thick to wound bed only Primary Dressing Silvercel 4 1/4x 4 1/4 (in/in) Discharge Instruction: Apply Silvercel 4 1/4x 4 1/4 (in/in) as instructed Secondary Dressing ABD Pad 5x9 (in/in) Discharge Instruction: Cover with ABD pad Secured With Compression Wrap Urgo K2, two layer compression system,  regular Compression Stockings Add-Ons Electronic Signature(s) Signed: 02/04/2023 4:24:45 PM By: Midge Aver MSN RN CNS WTA Entered By: Midge Aver on 02/03/2023 08:13:34 -------------------------------------------------------------------------------- Vitals Details Patient Name: Date of Service: [name], Rodman M. 02/03/2023 7:45 A M Medical Record Number: [id number] Patient Account Number: [id number] Date of Birth/Sex: Treating RN: [birth year] (67 y.o. [name] Primary Care Izzabella Besse: Saralyn Pilar Other Clinician: Referring Cierah Crader: Treating Caya Soberanis/Extender: Tiburcio Pea in Treatment: 11 Vital Signs Time Taken: 07:54 Temperature (F): 97.6 Height (in): 67 Pulse (bpm): 56 Weight (lbs): 174 Respiratory Rate (breaths/min): 18 Body Mass Index (BMI): 27.2 Blood Pressure (mmHg): 117/71 Reference Range: 80 - 120 mg / dl Electronic Signature(s) Signed: 02/04/2023 4:24:45 PM By: Midge Aver MSN RN CNS WTA Entered By: Midge Aver on 02/03/2023 07:59:01

## 2023-02-10 ENCOUNTER — Encounter: Payer: BC Managed Care – PPO | Admitting: Physician Assistant

## 2023-02-10 DIAGNOSIS — L97818 Non-pressure chronic ulcer of other part of right lower leg with other specified severity: Secondary | ICD-10-CM | POA: Diagnosis not present

## 2023-02-10 DIAGNOSIS — I87311 Chronic venous hypertension (idiopathic) with ulcer of right lower extremity: Secondary | ICD-10-CM | POA: Diagnosis not present

## 2023-02-10 DIAGNOSIS — I872 Venous insufficiency (chronic) (peripheral): Secondary | ICD-10-CM | POA: Diagnosis not present

## 2023-02-10 DIAGNOSIS — I10 Essential (primary) hypertension: Secondary | ICD-10-CM | POA: Diagnosis not present

## 2023-02-10 DIAGNOSIS — S81801A Unspecified open wound, right lower leg, initial encounter: Secondary | ICD-10-CM | POA: Diagnosis not present

## 2023-02-10 DIAGNOSIS — L97819 Non-pressure chronic ulcer of other part of right lower leg with unspecified severity: Secondary | ICD-10-CM | POA: Diagnosis not present

## 2023-02-10 DIAGNOSIS — G8929 Other chronic pain: Secondary | ICD-10-CM | POA: Diagnosis not present

## 2023-02-10 NOTE — Progress Notes (Addendum)
[name], [name] ([id number]) [id number] Page 1 of 7 Visit Report for 02/10/2023 Chief Complaint Document Details Patient Name: Date of Service: [name], [name]. 02/10/2023 7:45 A M Medical Record Number: [id number] Patient Account Number: [id number] Date of Birth/Sex: Treating RN: January 11, 1956 (67 y.o. [name] Primary Care Provider: Saralyn Pilar Other Clinician: Referring Provider: Treating Provider/Extender: Tiburcio Pea in Treatment: 12 Information Obtained from: Patient Chief Complaint Right lower extremity ulcer Electronic Signature(s) Signed: 02/10/2023 8:07:41 AM By: Allen Derry PA-C Entered By: Allen Derry on 02/10/2023 08:07:41 -------------------------------------------------------------------------------- HPI Details Patient Name: Date of Service: [name], [name] 02/10/2023 7:45 A M Medical Record Number: [id number] Patient Account Number: [id number] Date of Birth/Sex: Treating RN: 16-Sep-1955 (67 y.o. [name] Primary Care Provider: Saralyn Pilar Other Clinician: Referring Provider: Treating Provider/Extender: Tiburcio Pea in Treatment: 12 History of Present Illness HPI Description: 11/18/2022 [name] is a 67 year old male with a past medical history of nerve conduction deafness that presents to the clinic for a 7 month history of nonhealing ulcers to the right lower extremity. He states these happened spontaneously and have progressively gotten worse. The more proximal wound has been present for 7 months and the most recent wound on the top of the foot/distal leg has been present for the past month. He has 5 wounds total. He visited the ED on 11/04/2022 for this issue. He was given cephalexin for which he completed. He has been using Several ointments at different times to address this issue. These ointments include Silvadene, mupirocin and clobetasol..  At times he keeps the wounds open to air. He also states he will go in the pool with the wounds exposed. For work he is on his feet for long periods of time. He does not wear compression stockings. ABIs in office were 0.8. He currently denies signs of infection. 8/28; patient presents for follow-up. He has been using Vashe wet-to-dry dressings to the wound beds. Patient's wound culture showed Pseudomonas aeruginosa sensitive to ciprofloxacin and this was sent into the pharmacy. Also recommended Keystone antibiotic ointment And this has already been ordered. He has not received this yet. He had a skin biopsy done as well that showed fibrosis inflammation. This was nonspecific. Patient declined debridement due to chronic pain. 9/4; patient presents for follow-up. He has been using Vashe wet-to-dry dressings to the wound beds. He obtains the [facility] antibiotic ointment tomorrow. He has 1 day left of his oral antibiotics. There has been improvement in size since last clinic visit to the wound beds. He currently denies systemic signs of infection. [name], [name] ([id number]) [id number] Page 2 of 7 9/11; patient presents for follow-up. He has been using Keystone antibiotic ointment to the wound beds. He has tolerated this treatment well. Wound measurements are stable however periwound appears less irritated. 9/18; patient using Keystone and the ABD pad and Tubigrip. Unfortunately the Keystone cream coalesced over the surface of the wounds requiring an aggressive debridement to remove it. Under the [facility] the surface of the wound was not viable requiring further aggressive debridement. The patient has 4 wounds on the right lateral leg which are venous in etiology and a clustered localized area 9/25; this patient has a difficult cluster of wounds on the right lateral lower leg and ankle. Very painful. We have been using Keystone, Hydrofera Blue and Tubigrip compression. His ABI  is 0.8 in this clinic. As far as I can see he is not seen vein  and vascular for reflux studies. A biopsy was done in our clinic that just showed scar tissue no other identifiable pathology. 10/2; this patient has a cluster of wounds on the right lateral lower leg and ankle. He has surrounding stasis dermatitis no doubt chronic venous insufficiency with hemosiderin deposition. I have also been concerned about the difficulty feeling his posterior tibial and dorsalis pedis pulses although his ABI in the clinic was 0.8. He definitely has arterial studies on Thursday, I thought he might have venous reflux studies at the same time if not he is going to need these [name]. Likely will need to see vascular surgery as well. We have been using [facility] ABDs and Tubigrip x 2 10/9 the patient is gone for his vascular testing. Fortunately the arterial studies were fairly normal although his ABI was noncompressible at 1.32 on the right is TBI at 0.92 is normal. Triphasic and biphasic waveforms. On the left ABI was normal triphasic waveforms and normal TBI in terms of the venous reflux studies surprisingly benign. He had no evidence of a DVT no superficial throat thrombosis. He did have venous reflux noted in the right greater saphenous vein in the calf although this is not the drainage area where his current wounds are on the right lateral calf. There was no reflux in the small saphenous vein. We are using Sorbact and hydrogel as the primary dressing also using Keystone. 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 Wounds are better this week and he tolerated the Urgo K2 compression well set it was comfortable and had no issues with it. Wound surfaces are better and there is less surrounding  erythema around this cluster of difficult wounds on the right lateral ankle and lower leg 10/23; appointment with the vascular surgery on November 5. Question is would he benefit from a greater saphenous vein ablation with regards to helping these wounds heal. I was hoping for a better outcome today however he has a yellowish gritty surface requiring debridement. We have been using Sorbact hydrogel. He was denied for Apligraf we will put TheraSkin through his [employer] based [name]. Not sure if this is a class the mile or not. He has not had any pain and says things feel better. He is tolerating the full 4-layer equivalent compression 10/30; appointment with vascular surgery on November 5. He comes in today with a fair amount of drainage here. He had 4 wounds in this area with significant surrounding stasis dermatitis they have coalesced now into 3 wounds. We have been using Sorbact and hydrogel. He was denied for Apligraf and we are still waiting to hear about TheraSkin although this may be a class the denile. He seemed to be doing well at the beginning of the month after putting TCA in the 4- layer compression on him however he now seems to have plateaued. 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 His wife is asking about changing this at home again when we were using Tubigrip's. His arterial studies were previously reasonably unremarkable. 02-03-2023 upon evaluation today patient appears to be doing pretty well currently in regard to his wound. He actually did see vascular yesterday and they actually discussed a venous ablation surgery for him. I  do think that this will be appropriate and I think it would help him as well. Fortunately I do not see any signs of active infection locally or systemically which is good news. There is some need for some sharp debridement but in general he seems to be making good progress here. 02-10-2023 upon evaluation today  patient's wound does show signs of quite a bit of drainage but I do not see obvious signs of infection at this time I discussed that with him today. With that being said I think that the silver cell may be trapping a lot of fluid up underneath the edge of this and I think if we were to switch something out to say [facility] this may actually do better. I discussed that with him today. Electronic Signature(s) Signed: 02/10/2023 9:42:02 AM By: Allen Derry PA-C Entered By: Allen Derry on 02/10/2023 09:42:02 -------------------------------------------------------------------------------- Physical Exam Details Patient Name: Date of Service: [name], [name] 02/10/2023 7:45 A M Medical Record Number: [id number] Patient Account Number: [id number] Date of Birth/Sex: Treating RN: February 27, 1956 (67 y.o. [name] Primary Care Provider: Saralyn Pilar Other Clinician: Referring Provider: Treating Provider/Extender: Tiburcio Pea in Treatment: 12 Constitutional Well-nourished and well-hydrated in no acute distress. Respiratory normal breathing without difficulty. Psychiatric LADANIAN, GUZOWSKI ([id number]) [id number] Page 3 of 7 this patient is able to make decisions and demonstrates good insight into disease process. Alert and Oriented x 3. pleasant and cooperative. Notes Upon inspection patient's wound bed actually showed signs of good granulation epithelization at this point. Fortunately I do not see any signs of active infection at this time which is great news and in general I do believe that we will making good headway here towards closure which is excellent as well. Electronic Signature(s) Signed: 02/10/2023 9:42:22 AM By: Allen Derry PA-C Entered By: Allen Derry on 02/10/2023 09:42:22 -------------------------------------------------------------------------------- Physician Orders Details Patient Name: Date of Service: [name], [name] 02/10/2023 7:45 A M Medical Record Number: [id number] Patient Account Number: [id number] Date of Birth/Sex: Treating RN: Nov 13, 1955 (67 y.o. 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 Bathing/ Shower/ Hygiene May shower with wound dressing protected with water repellent cover or cast protector. No tub bath. Anesthetic (Use 'Patient Medications' Section for Anesthetic Order Entry) Lidocaine applied to wound bed Wound Treatment Wound #1 - Lower Leg Wound Laterality: Right, Lateral Cleanser: Vashe 5.8 (oz) Every Other Day/30 Days Discharge Instructions: Use vashe 5.8 (oz) as directed Prim Dressing: Hydrofera Blue Ready Transfer Foam, 4x5 (in/in) Every Other Day/30 Days ary Discharge Instructions: Apply Hydrofera Blue Ready to wound bed as directed Secondary Dressing: ABD Pad 5x9 (in/in) (Generic) Every Other Day/30 Days Discharge Instructions: Cover with ABD pad Compression Wrap: Urgo K2, two layer compression system, regular Every Other Day/30 Days Electronic Signature(s) Signed: 02/11/2023 7:57:50 AM By: Allen Derry PA-C Signed: 02/11/2023 5:33:20 PM By: Midge Aver MSN RN CNS WTA Entered By: Midge Aver on 02/10/2023 08:31:50 Fei, [name] ([id number]) [id number] Page 4 of  7 --------------------------------------------------------------------------------  Problem List Details Patient Name: Date of Service: [name], SVETLIK. 02/10/2023 7:45 A M Medical Record Number: [id number] Patient Account Number: [id number] Date of Birth/Sex: Treating RN: Feb 04, 1956 (67 y.o. 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 02/10/2023 7:45 A M Medical Record Number: [id number] Patient Account Number: [id number] Date of Birth/Sex: Treating RN: 1955-04-14 (67 y.o. [name] Primary Care Provider: Saralyn Pilar Other Clinician: Referring Provider: Treating Provider/Extender: Tiburcio Pea in Treatment: [address] [name] ([id number]) [id number] Page 5 of 7 Chief Complaint Information obtained from Patient Right lower extremity ulcer History of Present Illness (HPI) 11/18/2022 Mr. Muhannad Dendinger is a 67 year old male with a past medical history of nerve conduction deafness that presents to the clinic for a 7 month history of nonhealing ulcers to the right lower extremity. He states  these happened spontaneously and have progressively gotten worse. The more proximal wound has been present for 7 months and the most recent wound on the top of the foot/distal leg has been present for the past month. He has 5 wounds total. He visited the ED on 11/04/2022 for this issue. He was given cephalexin for which he completed. He has been using Several ointments at different times to address this issue. These ointments include Silvadene, mupirocin and clobetasol.. At times he keeps the wounds open to air. He also states he will go in the pool with the wounds exposed. For work he is on his feet for long periods of time. He does not wear compression stockings. ABIs in office were 0.8. He currently denies signs of infection. 8/28; patient presents for follow-up. He has been using Vashe wet-to-dry dressings to the wound beds. Patient's wound culture showed Pseudomonas aeruginosa sensitive to ciprofloxacin and this was sent into the pharmacy. Also recommended Keystone antibiotic ointment And this has already been ordered. He has not received this yet. He had a skin biopsy done as well that showed fibrosis inflammation. This was nonspecific. Patient declined debridement due to chronic pain. 9/4; patient presents for follow-up. He has been using Vashe wet-to-dry dressings to the wound beds. He obtains the [facility] antibiotic ointment tomorrow. He has 1 day left of his oral antibiotics. There has been improvement in size since last clinic visit to the wound beds. He currently denies systemic signs of infection. 9/11; patient presents for follow-up. He has been using Keystone antibiotic ointment to the wound beds. He has tolerated this treatment well. Wound measurements are stable however periwound appears less irritated. 9/18; patient using Keystone and the ABD pad and Tubigrip. Unfortunately the Keystone cream coalesced over the surface of the wounds requiring an aggressive debridement to remove it.  Under the [facility] the surface of the wound was not viable requiring further aggressive debridement. The patient has 4 wounds on the right lateral leg which are venous in etiology and a clustered localized area 9/25; this patient  has a difficult cluster of wounds on the right lateral lower leg and ankle. Very painful. We have been using Keystone, Hydrofera Blue and Tubigrip compression. His ABI is 0.8 in this clinic. As far as I can see he is not seen vein and vascular for reflux studies. A biopsy was done in our clinic that just showed scar tissue no other identifiable pathology. 10/2; this patient has a cluster of wounds on the right lateral lower leg and ankle. He has surrounding stasis dermatitis no doubt chronic venous insufficiency with hemosiderin deposition. I have also been concerned about the difficulty feeling his posterior tibial and dorsalis pedis pulses although his ABI in the clinic was 0.8. He definitely has arterial studies on Thursday, I thought he might have venous reflux studies at the same time if not he is going to need these [name]. Likely will need to see vascular surgery as well. We have been using [facility] ABDs and Tubigrip x 2 10/9 the patient is gone for his vascular testing. Fortunately the arterial studies were fairly normal although his ABI was noncompressible at 1.32 on the right is TBI at 0.92 is normal. Triphasic and biphasic waveforms. On the left ABI was normal triphasic waveforms and normal TBI in terms of the venous reflux studies surprisingly benign. He had no evidence of a DVT no superficial throat thrombosis. He did have venous reflux noted in the right greater saphenous vein in the calf although this is not the drainage area where his current wounds are on the right lateral calf. There was no reflux in the small saphenous vein. We are using Sorbact and hydrogel as the primary dressing also using Keystone. 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 Wounds are better this week and he tolerated the Urgo K2 compression well set it was comfortable and had no issues with it. Wound surfaces are better and there is less surrounding erythema around this cluster of difficult wounds on the right lateral ankle and lower leg 10/23; appointment with the vascular surgery on November 5. Question is would he benefit from a greater saphenous vein ablation with regards to helping these wounds heal. I was hoping for a better outcome today however he has a yellowish gritty surface requiring debridement. We have been using Sorbact hydrogel. He was denied for Apligraf we will put TheraSkin through his [employer] based Charles [name]. Not sure if this is a class the mile or not. He has not had any pain and says things feel better. He is tolerating the full 4-layer equivalent compression 10/30; appointment with vascular surgery on November 5. He comes in today with a fair amount of drainage here. He had 4 wounds in this area with significant surrounding stasis dermatitis they have coalesced now into 3 wounds. We have been using Sorbact and hydrogel. He was denied for Apligraf and we are still waiting to hear about TheraSkin although this may be a class the denile. He seemed to be doing well at the beginning of the month after putting TCA in the 4- layer compression on him however he now seems to have plateaued. 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 His wife is asking about changing this at home again when we  were using Tubigrip's. His arterial studies were previously reasonably unremarkable. 02-03-2023 upon evaluation today patient appears to be doing pretty well currently in regard to his wound. He actually did see vascular  yesterday and they actually discussed a venous ablation surgery for him. I do think that this will be appropriate and I think it would help him as well. Fortunately I do not see any signs of active infection locally or systemically which is good news. There is some need for some sharp debridement but in general he seems to be making good progress here. 02-10-2023 upon evaluation today patient's wound does show signs of quite a bit of drainage but I do not see obvious signs of infection at this time I discussed that with him today. With that being said I think that the silver cell may be trapping a lot of fluid up underneath the edge of this and I think if we were to switch something out to say [facility] this may actually do better. I discussed that with him today. Objective Constitutional Well-nourished and well-hydrated in no acute distress. Vitals Time Taken: 7:52 AM, Height: 67 in, Weight: 174 lbs, BMI: 27.2, Temperature: 98.0 F, Pulse: 59 bpm, Respiratory Rate: 18 breaths/min, Blood Pressure: 146/86 mmHg. Respiratory normal breathing without difficulty. [name], [name] ([id number]) [id number] Page 6 of 7 Psychiatric this patient is able to make decisions and demonstrates good insight into disease process. Alert and Oriented x 3. pleasant and cooperative. General Notes: Upon inspection patient's wound bed actually showed signs of good granulation epithelization at this point. Fortunately I do not see any signs of active infection at this time which is great news and in general I do believe that we will making good headway here towards closure which is excellent as well. Integumentary (Hair, Skin) Wound #1 status is Open. Original cause of wound was Gradually Appeared. The date acquired was: 08/29/2022. The wound has been in treatment 12 weeks. The wound is located on the Right,Lateral Lower Leg. The wound measures 10.5cm length x 6cm width x 0.5cm depth; 49.48cm^2  area and 24.74cm^3 volume. There is Fat Layer (Subcutaneous Tissue) exposed. There is a medium amount of serosanguineous drainage noted. There is no granulation within the wound bed. There is a large (67-100%) amount of necrotic tissue within the wound bed including Eschar. 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 There was a Four Layer Compression Therapy Procedure by Midge Aver, RN. Post procedure Diagnosis Wound #1: Same as Pre-Procedure Plan Follow-up Appointments: Return Appointment in 1 week. Bathing/ Shower/ Hygiene: May shower with wound dressing protected with water repellent cover or cast protector. No tub bath. Anesthetic (Use 'Patient Medications' Section for Anesthetic Order Entry): Lidocaine applied to wound bed WOUND #1: - Lower Leg Wound Laterality: Right, Lateral Cleanser: Vashe 5.8 (oz) Every Other Day/30 Days Discharge Instructions: Use vashe 5.8 (oz) as directed Prim Dressing: Hydrofera Blue Ready Transfer Foam, 4x5 (in/in) Every Other Day/30 Days ary Discharge Instructions: Apply Hydrofera Blue Ready to wound bed as directed Secondary Dressing: ABD Pad 5x9 (in/in) (Generic) Every Other Day/30 Days Discharge Instructions: Cover with ABD pad Com pression Wrap: Urgo K2, two layer compression system, regular Every Other Day/30 Days 1. I am going to recommend that  we have the patient going continue to monitor for any signs of infection or worsening. Based on what I am seeing I do believe that the patient is making really good headway here towards getting this closed but I think switching to [facility] may be better we use a little bit  of AandD ointment around the edges of the wound and then the Hydrofera Blue topically. 2. I am good recommend as well that we have the patient continue with ABD pad or Zetuvit to cover right now we are using Zetuvit. 3. And also can recommend that we continue with Urgo K2 compression wrap. We will see patient back for reevaluation in 1 week here in the clinic. If anything worsens or changes patient will contact our office for additional recommendations. Electronic Signature(s) Signed: 02/10/2023 9:42:47 AM By: Allen Derry PA-C Entered By: Allen Derry on 02/10/2023 09:42:47 Lienhard, [name] ([id number]) [id number] Page 7 of 7 -------------------------------------------------------------------------------- SuperBill Details Patient Name: Date of Service: [name], [name] 02/10/2023 Medical Record Number: [id number] Patient Account Number: [id number] Date of Birth/Sex: Treating RN: 1955-12-29 (67 y.o. 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, unspecified Facility Procedures : CPT4 Code: [id number] Description: (Facility Use Only) [phone] - APPLY MULTLAY COMPRS LWR RT LEG Modifier: Quantity: 1 Physician Procedures : CPT4 Code Description Modifier [id number] - WC PHYS LEVEL 3 - EST PT ICD-10 Diagnosis Description I87.311 Chronic venous hypertension (idiopathic) with ulcer of right lower extremity L97.818 Non-pressure chronic ulcer of other part of right lower  leg with other specified severity H90.8 Mixed conductive and sensorineural hearing  loss, unspecified Quantity: 1 Electronic Signature(s) Signed: 02/10/2023 9:43:43 AM By: Allen Derry PA-C Entered By: Allen Derry on 02/10/2023 09:43:43

## 2023-02-12 NOTE — Progress Notes (Signed)
[name], [name] ([id number]) [id number] Page 1 of 9 Visit Report for 02/10/2023 Arrival Information Details Patient Name: Date of Service: [name], [name]. 02/10/2023 7:45 A M Medical Record Number: [id number] Patient Account Number: [id number] Date of Birth/Sex: Treating RN: 20-Nov-1955 (67 y.o. [name] Primary Care Amairany Schumpert: Saralyn Pilar Other Clinician: Referring Jermiah Soderman: Treating Ender Rorke/Extender: Tiburcio Pea in Treatment: 12 Visit Information History Since Last Visit Added or deleted any medications: No Patient Arrived: Ambulatory Any new allergies or adverse reactions: No Arrival Time: 07:45 Has Dressing in Place as Prescribed: Yes Accompanied By: wife Has Compression in Place as Prescribed: Yes Transfer Assistance: None Pain Present Now: No Patient Identification Verified: Yes Secondary Verification Process Completed: Yes Patient Requires Transmission-Based Precautions: No Patient Has Alerts: Yes Patient Alerts: R ABI 1.32 L ABI 1.24 R TBI .92 L TBI 1.05 Electronic Signature(s) Signed: 02/11/2023 5:33:20 PM By: Midge Aver MSN RN CNS WTA Entered By: Midge Aver on 02/10/2023 07:46:02 -------------------------------------------------------------------------------- Clinic Level of Care Assessment Details Patient Name: Date of Service: [name]. 02/10/2023 7:45 A M Medical Record Number: [id number] Patient Account Number: [id number] Date of Birth/Sex: Treating RN: Dec 07, 1955 (67 y.o. [name] Primary Care Thadius Smisek: Saralyn Pilar Other Clinician: Referring Vernell Back: Treating Yasmin Bronaugh/Extender: Tiburcio Pea in Treatment: 12 Clinic Level of Care Assessment Items TOOL 1 Quantity Score []  - 0 Use when EandM and Procedure is performed on INITIAL visit ASSESSMENTS - Nursing Assessment / Reassessment []  - 0 General Physical Exam (combine w/  comprehensive assessment (listed just below) when performed on new pt. evals) []  - 0 Comprehensive Assessment (HX, ROS, Risk Assessments, Wounds Hx, etc.) ASSESSMENTS - Wound and Skin Assessment / Reassessment []  - 0 Dermatologic / Skin Assessment (not related to wound area) Wojdyla, [name] ([id number]) [id number] Page 2 of 9 ASSESSMENTS - Ostomy and/or Continence Assessment and Care []  - 0 Incontinence Assessment and Management []  - 0 Ostomy Care Assessment and Management (repouching, etc.) PROCESS - Coordination of Care []  - 0 Simple Patient / Family Education for ongoing care []  - 0 Complex (extensive) Patient / Family Education for ongoing care []  - 0 Staff obtains Chiropractor, Records, T Results / Process Orders est []  - 0 Staff telephones HHA, Nursing Homes / Clarify orders / etc []  - 0 Routine Transfer to another Facility (non-emergent condition) []  - 0 Routine Hospital Admission (non-emergent condition) []  - 0 New Admissions / Manufacturing engineer / Ordering NPWT Apligraf, etc. , []  - 0 Emergency Hospital Admission (emergent condition) PROCESS - Special Needs []  - 0 Pediatric / Minor Patient Management []  - 0 Isolation Patient Management []  - 0 Hearing / Language / Visual special needs []  - 0 Assessment of Community assistance (transportation, D/C planning, etc.) []  - 0 Additional assistance / Altered mentation []  - 0 Support Surface(s) Assessment (bed, cushion, seat, etc.) INTERVENTIONS - Miscellaneous []  - 0 External ear exam []  - 0 Patient Transfer (multiple staff / Nurse, adult / Similar devices) []  - 0 Simple Staple / Suture removal (25 or less) []  - 0 Complex Staple / Suture removal (26 or more) []  - 0 Hypo/Hyperglycemic Management (do not check if billed separately) []  - 0 Ankle / Brachial Index (ABI) - do not check if billed separately Has the patient been seen at the hospital within the last three years: Yes Total  Score: 0 Level Of Care: ____ Electronic Signature(s) Signed: 02/11/2023 5:33:20 PM By: Midge Aver MSN RN CNS WTA Entered  By: Midge Aver on 02/10/2023 08:35:10 -------------------------------------------------------------------------------- Compression Therapy Details Patient Name: Date of Service: [name]. 02/10/2023 7:45 A M Medical Record Number: [id number] Patient Account Number: [id number] Date of Birth/Sex: Treating RN: Apr 15, 1955 (67 y.o. [name] Primary Care Caedan Sumler: Saralyn Pilar Other Clinician: Referring Trella Thurmond: Treating Fareeha Evon/Extender: Tiburcio Pea in Treatment: 12 Compression Therapy Performed for Wound Assessment: Wound #1 Right,Lateral Lower Leg Performed By: Clinician Midge Aver, RN Compression Type: Four Posey Boyer, [name] ([id number]) [id number] Page 3 of 9 Post Procedure Diagnosis Same as Pre-procedure Electronic Signature(s) Signed: 02/11/2023 5:33:20 PM By: Midge Aver MSN RN CNS WTA Entered By: Midge Aver on 02/10/2023 08:31:01 -------------------------------------------------------------------------------- Encounter Discharge Information Details Patient Name: Date of Service: [name], [name] 02/10/2023 7:45 A M Medical Record Number: [id number] Patient Account Number: [id number] Date of Birth/Sex: Treating RN: 1955-08-14 (67 y.o. [name] Primary Care Tationa Stech: Saralyn Pilar Other Clinician: Referring Leinani Lisbon: Treating Yoshio Seliga/Extender: Tiburcio Pea in Treatment: 12 Encounter Discharge Information Items Discharge Condition: Stable Ambulatory Status: Ambulatory Discharge Destination: Home Transportation: Private Auto Accompanied By: wife Schedule Follow-up Appointment: Yes Clinical Summary of Care: Electronic Signature(s) Signed: 02/11/2023 5:33:20 PM By: Midge Aver MSN RN CNS WTA Entered By: Midge Aver on  02/10/2023 08:36:23 -------------------------------------------------------------------------------- Lower Extremity Assessment Details Patient Name: Date of Service: [name], [name] 02/10/2023 7:45 A M Medical Record Number: [id number] Patient Account Number: [id number] Date of Birth/Sex: Treating RN: 10-May-1955 (67 y.o. [name] Primary Care Kalliopi Coupland: Saralyn Pilar Other Clinician: Referring Dagen Beevers: Treating Amali Uhls/Extender: Tiburcio Pea in Treatment: 12 Edema Assessment Assessed: Kyra Searles: No] Franne Forts: No] [Left: Edema] Franne Forts: :] Pearletha Forge, [name] ([id number]) [id number] Page 4 of 9 Left: Right: Point of Measurement: 32 cm From Medial Instep 39 cm Ankle Left: Right: Point of Measurement: 11 cm From Medial Instep 29 cm Vascular Assessment Pulses: Dorsalis Pedis Palpable: [Right:Yes] Extremity colors, hair growth, and conditions: Extremity Color: [Right:Hyperpigmented] Hair Growth on Extremity: [Right:No] Temperature of Extremity: [Right:Warm] Capillary Refill: [Right:< 3 seconds] Dependent Rubor: [Right:No] Blanched when Elevated: [Right:No No] Toe Nail Assessment Left: Right: Thick: No Discolored: No Deformed: No Improper Length and Hygiene: No Electronic Signature(s) Signed: 02/11/2023 5:33:20 PM By: Midge Aver MSN RN CNS WTA Entered By: Midge Aver on 02/10/2023 08:06:37 -------------------------------------------------------------------------------- Multi Wound Chart Details Patient Name: Date of Service: [name], [name] 02/10/2023 7:45 A M Medical Record Number: [id number] Patient Account Number: [id number] Date of Birth/Sex: Treating RN: [birth year] (67 y.o. [name] Primary Care Jakyah Bradby: Saralyn Pilar Other Clinician: Referring Tacari Repass: Treating Laquenta Whitsell/Extender: Tiburcio Pea in Treatment: 12 Vital Signs Height(in): 67 Pulse(bpm):  59 Weight(lbs): 174 Blood Pressure(mmHg): 146/86 Body Mass Index(BMI): 27.2 Temperature(F): 98.0 Respiratory Rate(breaths/min): 18 [1:Photos:] [N/A:N/A] DECATUR, SEGREST ([id number]) [1:Right, Lateral Lower Leg Wound Location: Gradually Appeared Wounding Event: Atypical Primary Etiology: Asthma, Hypertension Comorbid History: 08/29/2022 Date Acquired: 12 Weeks of Treatment: Open Wound Status: No Wound Recurrence: Yes Clustered Wound:  10.5x6x0.5 Measurements L x W x D (cm) 49.48 A (cm) : rea 24.74 Volume (cm) : 47.90% % Reduction in A rea: 34.90% % Reduction in Volume: Full Thickness Without Exposed Classification: Support Structures Medium Exudate A mount: Serosanguineous Exudate  Type: red, brown Exudate Color: None Present (0%) Granulation Amount: Large (67-100%) Necrotic Amount: Eschar Necrotic Tissue: Fat Layer (Subcutaneous Tissue): Yes N/A Exposed Structures: Fascia: No Tendon: No Muscle: No Joint: No Bone: No None  Epithelialization:] [N/A:N/A N/A  N/A N/A N/A N/A N/A N/A N/A N/A N/A N/A N/A N/A N/A N/A N/A N/A N/A N/A N/A N/A] Treatment Notes Electronic Signature(s) Signed: 02/11/2023 5:33:20 PM By: Midge Aver MSN RN CNS WTA Entered By: Midge Aver on 02/10/2023 08:12:29 -------------------------------------------------------------------------------- Multi-Disciplinary Care Plan Details Patient Name: Date of Service: [name], [name] 02/10/2023 7:45 A M Medical Record Number: [id number] Patient Account Number: [id number] Date of Birth/Sex: Treating RN: Jul 17, 1955 (67 y.o. [name] Primary Care Kohl Polinsky: Saralyn Pilar Other Clinician: Referring Adekunle Rohrbach: Treating Gaylynn Seiple/Extender: Tiburcio Pea in Treatment: 12 Active Inactive Necrotic Tissue Nursing Diagnoses: Knowledge deficit related to management of necrotic/devitalized tissue Goals: Patient/caregiver will verbalize understanding of reason and process for debridement of necrotic  tissue Date Initiated: 11/18/2022 Target Resolution Date: 03/20/2023 Goal Status: Active Interventions: Assess patient pain level pre-, during and post procedure and prior to discharge NotesTYKEVIOUS, KENDERDINE ([id number]) [phone].pdf Page 6 of 9 Wound/Skin Impairment Nursing Diagnoses: Knowledge deficit related to ulceration/compromised skin integrity Goals: Patient/caregiver will verbalize understanding of skin care regimen Date Initiated: 11/18/2022 Date Inactivated: 12/30/2022 Target Resolution Date: 12/19/2022 Goal Status: Met Ulcer/skin breakdown will have a volume reduction of 30% by week 4 Date Initiated: 11/18/2022 Date Inactivated: 12/30/2022 Target Resolution Date: 12/19/2022 Goal Status: Met Ulcer/skin breakdown will have a volume reduction of 50% by week 8 Date Initiated: 11/18/2022 Date Inactivated: 01/13/2023 Target Resolution Date: 01/18/2023 Goal Status: Met Ulcer/skin breakdown will have a volume reduction of 80% by week 12 Date Initiated: 11/18/2022 Target Resolution Date: 02/18/2023 Goal Status: Active Ulcer/skin breakdown will heal within 14 weeks Date Initiated: 11/18/2022 Target Resolution Date: 03/20/2023 Goal Status: Active Interventions: Assess patient/caregiver ability to obtain necessary supplies Assess patient/caregiver ability to perform ulcer/skin care regimen upon admission and as needed Assess ulceration(s) every visit Notes: Electronic Signature(s) Signed: 02/11/2023 5:33:20 PM By: Midge Aver MSN RN CNS WTA Entered By: Midge Aver on 02/10/2023 08:35:37 -------------------------------------------------------------------------------- Pain Assessment Details Patient Name: Date of Service: [name], [name] 02/10/2023 7:45 A M Medical Record Number: [id number] Patient Account Number: [id number] Date of Birth/Sex: Treating RN: [birth year] (67 y.o. [name] Primary Care Temisha Murley: Saralyn Pilar Other  Clinician: Referring Jeramey Lanuza: Treating Square Jowett/Extender: Tiburcio Pea in Treatment: 12 Active Problems Location of Pain Severity and Description of Pain Patient Has Paino No Site Locations [place] ([id number]) [id number] Page 7 of 9 Pain Management and Medication Current Pain Management: Electronic Signature(s) Signed: 02/11/2023 5:33:20 PM By: Midge Aver MSN RN CNS WTA Entered By: Midge Aver on 02/10/2023 07:53:14 -------------------------------------------------------------------------------- Patient/Caregiver Education Details Patient Name: Date of Service: [name] 11/13/2024andnbsp7:45 A M Medical Record Number: [id number] Patient Account Number: [id number] Date of Birth/Gender: Treating RN: 22-[name]-1956 (67 y.o. [name] Primary Care Physician: Saralyn Pilar Other Clinician: Referring Physician: Treating Physician/Extender: Tiburcio Pea in Treatment: 12 Education Assessment Education Provided To: Patient Education Topics Provided Wound/Skin Impairment: Handouts: Caring for Your Ulcer Methods: Explain/Verbal Responses: State content correctly Electronic Signature(s) Signed: 02/11/2023 5:33:20 PM By: Midge Aver MSN RN CNS WTA Entered By: Midge Aver on 02/10/2023 08:35:48 Kilroy, [name] ([id number]) [id number] Page 8 of 9 -------------------------------------------------------------------------------- Wound Assessment Details Patient Name: Date of Service: [name], COST. 02/10/2023 7:45 A M Medical Record Number: [id number] Patient Account Number: [id number] Date of Birth/Sex: Treating RN: September 04, 1955 (67 y.o. [name] Primary Care Corydon Schweiss: Saralyn Pilar Other Clinician: Referring Senna Lape: Treating Shivansh Hardaway/Extender: Tiburcio Pea in Treatment:  12 Wound Status Wound Number:  1 Primary Etiology: Atypical Wound Location: Right, Lateral Lower Leg Wound Status: Open Wounding Event: Gradually Appeared Comorbid History: Asthma, Hypertension Date Acquired: 08/29/2022 Weeks Of Treatment: 12 Clustered Wound: Yes Photos Wound Measurements Length: (cm) 10.5 Width: (cm) 6 Depth: (cm) 0.5 Area: (cm) 49.48 Volume: (cm) 24.74 % Reduction in Area: 47.9% % Reduction in Volume: 34.9% Epithelialization: None Wound Description Classification: Full Thickness Without Exposed Suppor Exudate Amount: Medium Exudate Type: Serosanguineous Exudate Color: red, brown t Structures Foul Odor After Cleansing: No Slough/Fibrino Yes Wound Bed Granulation Amount: None Present (0%) Exposed Structure Necrotic Amount: Large (67-100%) Fascia Exposed: No Necrotic Quality: Eschar Fat Layer (Subcutaneous Tissue) Exposed: Yes Tendon Exposed: No Muscle Exposed: No Joint Exposed: No Bone Exposed: No Treatment Notes Wound #1 (Lower Leg) Wound Laterality: Right, Lateral Cleanser Vashe 5.8 (oz) Discharge Instruction: Use vashe 5.8 (oz) as directed Peri-Wound Care Ribeiro, [name] ([id number]) [id number] Page 9 of 9 Topical Primary Dressing Hydrofera Blue Ready Transfer Foam, 4x5 (in/in) Discharge Instruction: Apply Hydrofera Blue Ready to wound bed as directed Secondary Dressing ABD Pad 5x9 (in/in) Discharge Instruction: Cover with ABD pad Secured With Compression Wrap Urgo K2, two layer compression system, regular Compression Stockings Add-Ons Electronic Signature(s) Signed: 02/11/2023 5:33:20 PM By: Midge Aver MSN RN CNS WTA Entered By: Midge Aver on 02/10/2023 08:05:29 -------------------------------------------------------------------------------- Vitals Details Patient Name: Date of Service: [name], Jaramiah M. 02/10/2023 7:45 A M Medical Record Number: [id number] Patient Account Number: [id number] Date of Birth/Sex: Treating RN: Aug 05, 1955 (67  y.o. [name] Primary Care Romona Murdy: Saralyn Pilar Other Clinician: Referring Kemonte Ullman: Treating Konstantin Lehnen/Extender: Tiburcio Pea in Treatment: 12 Vital Signs Time Taken: 07:52 Temperature (F): 98.0 Height (in): 67 Pulse (bpm): 59 Weight (lbs): 174 Respiratory Rate (breaths/min): 18 Body Mass Index (BMI): 27.2 Blood Pressure (mmHg): 146/86 Reference Range: 80 - 120 mg / dl Electronic Signature(s) Signed: 02/11/2023 5:33:20 PM By: Midge Aver MSN RN CNS WTA Entered By: Midge Aver on 02/10/2023 07:52:56

## 2023-02-15 DIAGNOSIS — N1330 Unspecified hydronephrosis: Secondary | ICD-10-CM | POA: Diagnosis not present

## 2023-02-15 DIAGNOSIS — N1832 Chronic kidney disease, stage 3b: Secondary | ICD-10-CM | POA: Diagnosis not present

## 2023-02-15 DIAGNOSIS — N2581 Secondary hyperparathyroidism of renal origin: Secondary | ICD-10-CM | POA: Diagnosis not present

## 2023-02-15 DIAGNOSIS — I10 Essential (primary) hypertension: Secondary | ICD-10-CM | POA: Diagnosis not present

## 2023-02-15 DIAGNOSIS — N1831 Chronic kidney disease, stage 3a: Secondary | ICD-10-CM | POA: Diagnosis not present

## 2023-02-15 DIAGNOSIS — R809 Proteinuria, unspecified: Secondary | ICD-10-CM | POA: Diagnosis not present

## 2023-02-17 ENCOUNTER — Encounter: Payer: BC Managed Care – PPO | Admitting: Physician Assistant

## 2023-02-17 ENCOUNTER — Encounter (INDEPENDENT_AMBULATORY_CARE_PROVIDER_SITE_OTHER): Payer: Self-pay

## 2023-02-17 DIAGNOSIS — I872 Venous insufficiency (chronic) (peripheral): Secondary | ICD-10-CM | POA: Diagnosis not present

## 2023-02-17 DIAGNOSIS — G8929 Other chronic pain: Secondary | ICD-10-CM | POA: Diagnosis not present

## 2023-02-17 DIAGNOSIS — S81801A Unspecified open wound, right lower leg, initial encounter: Secondary | ICD-10-CM | POA: Diagnosis not present

## 2023-02-17 DIAGNOSIS — L97819 Non-pressure chronic ulcer of other part of right lower leg with unspecified severity: Secondary | ICD-10-CM | POA: Diagnosis not present

## 2023-02-17 DIAGNOSIS — L97818 Non-pressure chronic ulcer of other part of right lower leg with other specified severity: Secondary | ICD-10-CM | POA: Diagnosis not present

## 2023-02-17 DIAGNOSIS — I10 Essential (primary) hypertension: Secondary | ICD-10-CM | POA: Diagnosis not present

## 2023-02-17 DIAGNOSIS — I87311 Chronic venous hypertension (idiopathic) with ulcer of right lower extremity: Secondary | ICD-10-CM | POA: Diagnosis not present

## 2023-02-17 NOTE — Progress Notes (Addendum)
JAVIS, JAGGARD ([id number]) [id number] Page 1 of 8 Visit Report for 02/17/2023 Chief Complaint Document Details Patient Name: Date of Service: [name], [name]. 02/17/2023 7:45 A M Medical Record Number: [id number] Patient Account Number: [id number] Date of Birth/Sex: Treating RN: 09/30/1955 (67 y.o. [name] Primary Care Provider: Saralyn Pilar Other Clinician: Referring Provider: Treating Provider/Extender: Tiburcio Pea in Treatment: 13 Information Obtained from: Patient Chief Complaint Right lower extremity ulcer Electronic Signature(s) Signed: 02/17/2023 7:57:53 AM By: Allen Derry PA-C Entered By: Allen Derry on 02/17/2023 07:57:53 -------------------------------------------------------------------------------- Debridement Details Patient Name: Date of Service: [name], [name] 02/17/2023 7:45 A M Medical Record Number: [id number] Patient Account Number: [id number] Date of Birth/Sex: Treating RN: 1955/04/05 (67 y.o. [name] Primary Care Provider: Saralyn Pilar Other Clinician: Referring Provider: Treating Provider/Extender: Tiburcio Pea in Treatment: 13 Debridement Performed for Assessment: Wound #1 Right,Lateral Lower Leg Performed By: Physician Allen Derry, PA-C The following information was scribed by: Midge Aver The information was scribed for: Allen Derry Debridement Type: Debridement Level of Consciousness (Pre-procedure): Awake and Alert Pre-procedure Verification/Time Out Yes - 08:16 Taken: Start Time: 08:16 Pain Control: Lidocaine 4% T opical Solution Percent of Wound Bed Debrided: 100% T Area Debrided (cm): otal 40.82 Tissue and other material debrided: Viable, Non-Viable, Slough, Subcutaneous, Slough Level: Skin/Subcutaneous Tissue Debridement Description: Excisional Instrument: Curette Bleeding: Minimum Hemostasis Achieved: Pressure [place]ham,  [name] ([id number]) [id number] Page 2 of 8 Procedural Pain: 3 Post Procedural Pain: 3 Response to Treatment: Procedure was tolerated well Level of Consciousness (Post- Awake and Alert procedure): Post Debridement Measurements of Total Wound Length: (cm) 10.4 Width: (cm) 5 Depth: (cm) 0.5 Volume: (cm) 20.42 Character of Wound/Ulcer Post Debridement: Stable Post Procedure Diagnosis Same as Pre-procedure Electronic Signature(s) Signed: 02/18/2023 1:29:03 PM By: Allen Derry PA-C Signed: 02/19/2023 12:46:12 PM By: Midge Aver MSN RN CNS WTA Entered By: Midge Aver on 02/17/2023 08:17:18 -------------------------------------------------------------------------------- HPI Details Patient Name: Date of Service: [name], [name] 02/17/2023 7:45 A M Medical Record Number: [id number] Patient Account Number: [id number] Date of Birth/Sex: Treating RN: 04/26/1955 (67 y.o. [name] Primary Care Provider: Saralyn Pilar Other Clinician: Referring Provider: Treating Provider/Extender: Tiburcio Pea in Treatment: 13 History of Present Illness HPI Description: 11/18/2022 Mr. Guthrie Crisp is a 67 year old male with a past medical history of nerve conduction deafness that presents to the clinic for a 7 month history of nonhealing ulcers to the right lower extremity. He states these happened spontaneously and have progressively gotten worse. The more proximal wound has been present for 7 months and the most recent wound on the top of the foot/distal leg has been present for the past month. He has 5 wounds total. He visited the ED on 11/04/2022 for this issue. He was given cephalexin for which he completed. He has been using Several ointments at different times to address this issue. These ointments include Silvadene, mupirocin and clobetasol.. At times he keeps the wounds open to air. He also states he will go in the pool with the  wounds exposed. For work he is on his feet for long periods of time. He does not wear compression stockings. ABIs in office were 0.8. He currently denies signs of infection. 8/28; patient presents for follow-up. He has been using Vashe wet-to-dry dressings to the wound beds. Patient's wound culture showed Pseudomonas aeruginosa sensitive to ciprofloxacin and this was sent into the pharmacy. Also recommended Keystone antibiotic ointment And  this has already been ordered. He has not received this yet. He had a skin biopsy done as well that showed fibrosis inflammation. This was nonspecific. Patient declined debridement due to chronic pain. 9/4; patient presents for follow-up. He has been using Vashe wet-to-dry dressings to the wound beds. He obtains the [facility] antibiotic ointment tomorrow. He has 1 day left of his oral antibiotics. There has been improvement in size since last clinic visit to the wound beds. He currently denies systemic signs of infection. 9/11; patient presents for follow-up. He has been using Keystone antibiotic ointment to the wound beds. He has tolerated this treatment well. Wound measurements are stable however periwound appears less irritated. 9/18; patient using Keystone and the ABD pad and Tubigrip. Unfortunately the Keystone cream coalesced over the surface of the wounds requiring an aggressive debridement to remove it. Under the [facility] the surface of the wound was not viable requiring further aggressive debridement. The patient has 4 wounds on the right lateral leg which are venous in etiology and a clustered localized area 9/25; this patient has a difficult cluster of wounds on the right lateral lower leg and ankle. Very painful. We have been using Keystone, Hydrofera Blue and Tubigrip compression. His ABI is 0.8 in this clinic. As far as I can see he is not seen vein and vascular for reflux studies. A biopsy was done in our clinic that just showed scar tissue no other  identifiable pathology. 10/2; this patient has a cluster of wounds on the right lateral lower leg and ankle. He has surrounding stasis dermatitis no doubt chronic venous insufficiency with hemosiderin deposition. I have also been concerned about the difficulty feeling his posterior tibial and dorsalis pedis pulses although his ABI in the clinic was 0.8. He definitely has arterial studies on Thursday, I thought he might have venous reflux studies at the same time if not he is going to need these [name]. Likely will need to see vascular surgery as well. We have been using [address] ABDs and Tubigrip x 2 Banfield, Ausencio M ([id number]) [id number] Page 3 of 8 10/9 the patient is gone for his vascular testing. Fortunately the arterial studies were fairly normal although his ABI was noncompressible at 1.32 on the right is TBI at 0.92 is normal. Triphasic and biphasic waveforms. On the left ABI was normal triphasic waveforms and normal TBI in terms of the venous reflux studies surprisingly benign. He had no evidence of a DVT no superficial throat thrombosis. He did have venous reflux noted in the right greater saphenous vein in the calf although this is not the drainage area where his current wounds are on the right lateral calf. There was no reflux in the small saphenous vein. We are using Sorbact and hydrogel as the primary dressing also using Keystone. 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 Wounds are better this week and he tolerated the Urgo K2 compression well set it was comfortable and had no issues with it. Wound surfaces are better and there is less surrounding erythema around this cluster of difficult wounds on the right lateral ankle and  lower leg 10/23; appointment with the vascular surgery on November 5. Question is would he benefit from a greater saphenous vein ablation with regards to helping these  wounds heal. I was hoping for a better outcome today however he has a yellowish gritty surface requiring debridement. We have been using Sorbact hydrogel. He was denied for Apligraf we will put TheraSkin through his [employer] based [name]. Not sure if this is a class the mile or not. He has not had any pain and says things feel better. He is tolerating the full 4-layer equivalent compression 10/30; appointment with vascular surgery on November 5. He comes in today with a fair amount of drainage here. He had 4 wounds in this area with significant surrounding stasis dermatitis they have coalesced now into 3 wounds. We have been using Sorbact and hydrogel. He was denied for Apligraf and we are still waiting to hear about TheraSkin although this may be a class the denile. He seemed to be doing well at the beginning of the month after putting TCA in the 4- layer compression on him however he now seems to have plateaued. 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 His wife is asking about changing this at home again when we were using Tubigrip's. His arterial studies were previously reasonably unremarkable. 02-03-2023 upon evaluation today patient appears to be doing pretty well currently in regard to his wound. He actually did see vascular yesterday and they actually discussed a venous ablation surgery for him. I do think that this will be appropriate and I think it would help him as well. Fortunately I do not see any signs of active infection locally or systemically which is good news. There is some need for some sharp debridement but in general he seems to be making good progress here. 02-10-2023 upon evaluation today patient's wound does show signs of quite a bit of drainage but I do not see obvious  signs of infection at this time I discussed that with him today. With that being said I think that the silver cell may be trapping a lot of fluid up underneath the edge of this and I think if we were to switch something out to say [facility] this may actually do better. I discussed that with him today. 02-17-2023 upon evaluation today patient appears to be doing well currently in regard to his wounds. He has been tolerating the dressing changes I did have to perform some debridement today wound actually looks significantly better I am actually very pleased with where we stand I do believe that he is making excellent headway towards closure. Electronic Signature(s) Signed: 02/17/2023 8:37:05 AM By: Allen Derry PA-C Entered By: Allen Derry on 02/17/2023 08:37:04 -------------------------------------------------------------------------------- Physical Exam Details Patient Name: Date of Service: [name], [name] 02/17/2023 7:45 A M Medical Record Number: [id number] Patient Account Number: [id number] Date of Birth/Sex: Treating RN: Oct 17, 1955 (67 y.o. [name] Primary Care Provider: Saralyn Pilar Other Clinician: Referring Provider: Treating Provider/Extender: Tiburcio Pea in Treatment: 13 Constitutional Well-nourished and well-hydrated in no acute distress. Respiratory normal breathing without difficulty. Psychiatric this patient is able to make decisions and demonstrates good insight into disease process. Alert and Oriented x 3. pleasant and cooperative. Notes Upon inspection patient's wound bed actually showed signs of good granulation and epithelization at this point. Fortunately I do not see the worsening overall and I believe that the patient is making really good headway here towards complete closure which is great news. any sign my hope is that he will continue to see improvement with the [facility] fortunately it did not  really seem  to stick at all which I really did not expected to do but this is great news. Electronic Signature(s) Signed: 02/17/2023 8:37:29 AM By: Koleen Distance, Argelio M ([id number]) [id number] Page 4 of 8 Entered By: Allen Derry on 02/17/2023 08:37:28 -------------------------------------------------------------------------------- Physician Orders Details Patient Name: Date of Service: [name], LAPRADE. 02/17/2023 7:45 A M Medical Record Number: [id number] Patient Account Number: [id number] Date of Birth/Sex: Treating RN: 01/14/1956 (67 y.o. 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 Bathing/ Shower/ Hygiene May shower with wound dressing protected with water repellent cover or cast protector. No tub bath. Anesthetic (Use 'Patient Medications' Section for Anesthetic Order Entry) Lidocaine applied to wound bed Wound Treatment Wound #1 - Lower Leg Wound Laterality: Right, Lateral Cleanser: Vashe 5.8 (oz) Every Other Day/30 Days Discharge Instructions: Use vashe 5.8 (oz) as directed Prim Dressing: Hydrofera Blue Ready Transfer Foam, 4x5 (in/in) Every Other Day/30 Days ary Discharge Instructions: Apply Hydrofera Blue Ready to wound bed as directed Secondary Dressing: ABD Pad 5x9 (in/in) (Generic) Every Other Day/30 Days Discharge Instructions: Cover  with ABD pad Compression Wrap: Urgo K2, two layer compression system, regular Every Other Day/30 Days Electronic Signature(s) Signed: 02/18/2023 1:29:03 PM By: Allen Derry PA-C Signed: 02/19/2023 12:46:12 PM By: Midge Aver MSN RN CNS WTA Entered By: Midge Aver on 02/17/2023 08:18:02 [name], [name] ([id number]) [id number] Page 5 of 8 -------------------------------------------------------------------------------- Problem List Details Patient Name: Date of Service: [name], WHISLER. 02/17/2023 7:45 A M Medical Record Number: [id number] Patient Account Number: [id number] Date of Birth/Sex: Treating RN: [birth year] (67 y.o. 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 02/17/2023 7:45 A M Medical Record Number: [id number] Patient Account Number: [id number] Date of Birth/Sex: Treating RN: 1955-11-28 (67 y.o. [name] Primary Care Provider: Saralyn Pilar Other Clinician: Referring  Provider: Treating Provider/Extender: Tiburcio Pea in Treatment: [address] [name] ([id number]) [id number] Page 6 of 8 Chief Complaint Information obtained from Patient Right lower extremity ulcer History of Present Illness (HPI) 11/18/2022 Mr. Thien Sia is a 67 year old male with a past medical history of nerve conduction deafness that presents to the clinic for a 7 month history of nonhealing ulcers to the right lower extremity. He states these happened spontaneously and have progressively gotten worse.  The more proximal wound has been present for 7 months and the most recent wound on the top of the foot/distal leg has been present for the past month. He has 5 wounds total. He visited the ED on 11/04/2022 for this issue. He was given cephalexin for which he completed. He has been using Several ointments at different times to address this issue. These ointments include Silvadene, mupirocin and clobetasol.. At times he keeps the wounds open to air. He also states he will go in the pool with the wounds exposed. For work he is on his feet for long periods of time. He does not wear compression stockings. ABIs in office were 0.8. He currently denies signs of infection. 8/28; patient presents for follow-up. He has been using Vashe wet-to-dry dressings to the wound beds. Patient's wound culture showed Pseudomonas aeruginosa sensitive to ciprofloxacin and this was sent into the pharmacy. Also recommended Keystone antibiotic ointment And this has already been ordered. He has not received this yet. He had a skin biopsy done as well that showed fibrosis inflammation. This was nonspecific. Patient declined debridement due to chronic pain. 9/4; patient presents for follow-up. He has been using Vashe wet-to-dry dressings to the wound beds. He obtains the [facility] antibiotic ointment tomorrow. He has 1 day left of his oral antibiotics. There  has been improvement in size since last clinic visit to the wound beds. He currently denies systemic signs of infection. 9/11; patient presents for follow-up. He has been using Keystone antibiotic ointment to the wound beds. He has tolerated this treatment well. Wound measurements are stable however periwound appears less irritated. 9/18; patient using Keystone and the ABD pad and Tubigrip. Unfortunately the Keystone cream coalesced over the surface of the wounds requiring an aggressive debridement to remove it. Under the [facility] the surface of the wound was not viable requiring further aggressive debridement. The patient has 4 wounds on the right lateral leg which are venous in etiology and a clustered localized area 9/25; this patient has a difficult cluster of wounds on the right lateral lower leg and ankle. Very painful. We have been using Keystone, Hydrofera Blue and Tubigrip compression. His ABI is 0.8 in this clinic. As far as I can see he is not seen vein and vascular for reflux studies. A biopsy was done in our clinic that just showed scar tissue no other identifiable pathology. 10/2; this patient has a cluster of wounds on the right lateral lower leg and ankle. He has surrounding stasis dermatitis no doubt chronic venous insufficiency with hemosiderin deposition. I have also been concerned about the difficulty feeling his posterior tibial and dorsalis pedis pulses although his ABI in the clinic was 0.8. He definitely has arterial studies on Thursday, I thought he might have venous reflux studies at the same time if not he is going to need these [name]. Likely will need to see vascular surgery as well. We have been using [facility] ABDs and Tubigrip x 2 10/9 the patient is gone for his vascular testing. Fortunately the arterial studies were fairly normal although his ABI was noncompressible at 1.32 on the right is TBI at 0.92 is normal. Triphasic and biphasic waveforms. On the  left ABI was normal triphasic waveforms and normal TBI in terms of the venous reflux studies surprisingly benign. He had no evidence of a DVT no superficial throat thrombosis. He did have venous reflux noted in the right greater saphenous vein in the calf although this is not the drainage  area where his current wounds are on the right lateral calf. There was no reflux in the small saphenous vein. We are using Sorbact and hydrogel as the primary dressing also using Keystone. 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 Wounds are better this week and he tolerated the Urgo K2 compression well set it was comfortable and had no issues with it. Wound surfaces are better and there is less surrounding erythema around this cluster of difficult wounds on the right lateral ankle and lower leg 10/23; appointment with the vascular surgery on November 5. Question is would he benefit from a greater saphenous vein ablation with regards to helping these wounds heal. I was hoping for a better outcome today however he has a yellowish gritty surface requiring debridement. We have been using Sorbact hydrogel. He was denied for Apligraf we will put TheraSkin through his [employer] based [name]. Not sure if this is a class the mile or not. He has not had any pain and says things feel better. He is tolerating the full 4-layer equivalent compression 10/30; appointment with vascular surgery on November 5. He comes in today with a fair amount of drainage here. He had 4 wounds in this area with significant surrounding stasis dermatitis they have coalesced now into 3 wounds. We have been using Sorbact and hydrogel. He was denied for Apligraf and we are still waiting to hear about TheraSkin although this may  be a class the denile. He seemed to be doing well at the beginning of the month after putting TCA in the 4- layer compression on him however he now seems to have plateaued. 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 His wife is asking about changing this at home again when we were using Tubigrip's. His arterial studies were previously reasonably unremarkable. 02-03-2023 upon evaluation today patient appears to be doing pretty well currently in regard to his wound. He actually did see vascular yesterday and they actually discussed a venous ablation surgery for him. I do think that this will be appropriate and I think it would help him as well. Fortunately I do not see any signs of active infection locally or systemically which is good news. There is some need for some sharp debridement but in general he seems to be making good progress here. 02-10-2023 upon evaluation today patient's wound does show signs of quite a bit of drainage but I do not see obvious signs of infection at this time I discussed that with him today. With that being said I think that the silver cell may be trapping a lot of fluid up underneath the edge of this and I think if we were to switch something out to say [facility] this may actually do better. I discussed that with him today. 02-17-2023 upon evaluation today patient appears to be doing well currently in regard to his wounds. He has been tolerating the dressing changes I did have to perform some debridement today wound actually looks significantly better I am actually very pleased with where we stand I do believe that he is making excellent headway towards closure. Objective Constitutional Well-nourished and well-hydrated in no acute distress. Vitals Time Taken: 7:57 AM, Height: 67 in, Weight: 174 lbs, BMI: 27.2,  Temperature: 98.1 F, Pulse: 61 bpm, Respiratory Rate: 18 breaths/min, Blood Pressure: [name], [name] ([id number])  [id number] Page 7 of 8 136/79 mmHg. Respiratory normal breathing without difficulty. Psychiatric this patient is able to make decisions and demonstrates good insight into disease process. Alert and Oriented x 3. pleasant and cooperative. General Notes: Upon inspection patient's wound bed actually showed signs of good granulation and epithelization at this point. Fortunately I do not see the worsening overall and I believe that the patient is making really good headway here towards complete closure which is great news. any sign my hope is that he will continue to see improvement with the [facility] fortunately it did not really seem to stick at all which I really did not expected to do but this is great news. Integumentary (Hair, Skin) Wound #1 status is Open. Original cause of wound was Gradually Appeared. The date acquired was: 08/29/2022. The wound has been in treatment 13 weeks. The wound is located on the Right,Lateral Lower Leg. The wound measures 10.4cm length x 5cm width x 0.5cm depth; 40.841cm^2 area and 20.42cm^3 volume. There is Fat Layer (Subcutaneous Tissue) exposed. There is a medium amount of serosanguineous drainage noted. There is no granulation within the wound bed. There is a large (67-100%) amount of necrotic tissue within the wound bed including Eschar. 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 There was a Excisional Skin/Subcutaneous Tissue Debridement with a total area of 40.82 sq cm performed by Allen Derry, PA-C. With the following instrument(s): Curette to remove Viable and Non-Viable tissue/material. Material  removed includes Subcutaneous Tissue and Slough and after achieving pain control using Lidocaine 4% T opical Solution. No specimens were taken. A time out was conducted at 08:16, prior to the start of the procedure. A Minimum amount of bleeding was controlled with Pressure. The procedure was tolerated well with a pain level of 3 throughout and a pain level of 3 following the procedure. Post Debridement Measurements: 10.4cm length x 5cm width x 0.5cm depth; 20.42cm^3 volume. Character of Wound/Ulcer Post Debridement is stable. Post procedure Diagnosis Wound #1: Same as Pre-Procedure Pre-procedure diagnosis of Wound #1 is an Atypical located on the Right,Lateral Lower Leg . There was a Four Layer Compression Therapy Procedure by Midge Aver, RN. Post procedure Diagnosis Wound #1: Same as Pre-Procedure Plan Follow-up Appointments: Return Appointment in 1 week. Bathing/ Shower/ Hygiene: May shower with wound dressing protected with water repellent cover or cast protector. No tub bath. Anesthetic (Use 'Patient Medications' Section for Anesthetic Order Entry): Lidocaine applied to wound bed WOUND #1: - Lower Leg Wound Laterality: Right, Lateral Cleanser: Vashe 5.8 (oz) Every Other Day/30 Days Discharge Instructions: Use vashe 5.8 (oz) as directed Prim Dressing: Hydrofera Blue Ready Transfer Foam, 4x5 (in/in) Every Other Day/30 Days ary Discharge Instructions: Apply Hydrofera Blue Ready to wound bed as directed Secondary Dressing: ABD Pad 5x9 (in/in) (Generic) Every Other Day/30 Days Discharge Instructions: Cover with ABD pad Com pression Wrap: Urgo K2, two layer compression system, regular Every Other Day/30 Days 1. I would recommend that we have the patient going continue to monitor for any signs of infection or worsening. Based on what I see I do believe that we are making really good headway here  towards closure which is great news. 2. I am going to suggest that the patient should continue  to use the [facility] dressing. He is also to use the ABD pad followed by the Urgo K2 compression wrap to follow. We will see patient back for reevaluation in 1 week here in the clinic. If anything worsens or changes patient will contact our office for additional recommendations. [name], [name] ([id number]) [id number] Page 8 of 8 Electronic Signature(s) Signed: 02/17/2023 8:38:54 AM By: Allen Derry PA-C Entered By: Allen Derry on 02/17/2023 08:38:54 -------------------------------------------------------------------------------- SuperBill Details Patient Name: Date of Service: [name], [name] 02/17/2023 Medical Record Number: [id number] Patient Account Number: [id number] Date of Birth/Sex: Treating RN: October 13, 1955 (67 y.o. [name] Primary Care Provider: Saralyn Pilar Other Clinician: Referring Provider: Treating Provider/Extender: Tiburcio Pea in Treatment: 13 Diagnosis Coding ICD-10 Codes Code Description [phone] Chronic venous hypertension (idiopathic) with ulcer of right lower extremity L97.818 Non-pressure chronic ulcer of other part of right lower leg with other specified severity 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, unspecified Facility Procedures : CPT4 Code: [id number] Description: 11042 - DEB SUBQ TISSUE 20 SQ CM/< ICD-10 Diagnosis Description L97.818 Non-pressure chronic ulcer of other part of right lower leg with other specified Modifier: severity Quantity: 1 : CPT4 Code: 84696295 Description: 11045 - DEB SUBQ TISS EA ADDL 20CM ICD-10 Diagnosis Description L97.818 Non-pressure chronic ulcer of other part of right lower leg with other specified Modifier: severity Quantity: 2 Physician Procedures : CPT4 Code Description Modifier 11042 11042 - WC PHYS SUBQ TISS 20 SQ CM ICD-10 Diagnosis Description L97.818 Non-pressure chronic ulcer of other part of right lower leg with other  specified severity Quantity: 1 : [id number] 11045 - WC PHYS SUBQ TISS EA ADDL 20 CM ICD-10 Diagnosis Description L97.818 Non-pressure chronic ulcer of other part of right lower leg with other specified severity Quantity: 2 Electronic Signature(s) Signed: 02/17/2023 8:39:07 AM By: Allen Derry PA-C Entered By: Allen Derry on 02/17/2023 08:39:07

## 2023-02-19 NOTE — Progress Notes (Signed)
[name], [name] ([id number]) [id number] Page 1 of 9 Visit Report for 02/17/2023 Arrival Information Details Patient Name: Date of Service: [name], [name]. 02/17/2023 7:45 A M Medical Record Number: [id number] Patient Account Number: [id number] Date of Birth/Sex: Treating RN: [birth year] (67 y.o. [name] Primary Care Edrees Valent: Saralyn Pilar Other Clinician: Referring Alverta Caccamo: Treating Veldon Wager/Extender: Tiburcio Pea in Treatment: 13 Visit Information History Since Last Visit Added or deleted any medications: No Patient Arrived: Ambulatory Any new allergies or adverse reactions: No Arrival Time: 07:56 Has Dressing in Place as Prescribed: Yes Accompanied By: wife Has Compression in Place as Prescribed: Yes Transfer Assistance: None Pain Present Now: No Patient Identification Verified: Yes Secondary Verification Process Completed: Yes Patient Requires Transmission-Based Precautions: No Patient Has Alerts: Yes Patient Alerts: R ABI 1.32 L ABI 1.24 R TBI .92 L TBI 1.05 Electronic Signature(s) Signed: 02/19/2023 12:46:12 PM By: Midge Aver MSN RN CNS WTA Entered By: Midge Aver on 02/17/2023 07:56:20 -------------------------------------------------------------------------------- Clinic Level of Care Assessment Details Patient Name: Date of Service: [name], [name]. 02/17/2023 7:45 A M Medical Record Number: [id number] Patient Account Number: [id number] Date of Birth/Sex: Treating RN: [birth year] (67 y.o. [name] Primary Care Pina Sirianni: Saralyn Pilar Other Clinician: Referring Andrell Tallman: Treating Shealyn Sean/Extender: Tiburcio Pea in Treatment: 13 Clinic Level of Care Assessment Items TOOL 1 Quantity Score []  - 0 Use when EandM and Procedure is performed on INITIAL visit ASSESSMENTS - Nursing Assessment / Reassessment []  - 0 General Physical Exam (combine w/  comprehensive assessment (listed just below) when performed on new pt. evals) []  - 0 Comprehensive Assessment (HX, ROS, Risk Assessments, Wounds Hx, etc.) ASSESSMENTS - Wound and Skin Assessment / Reassessment []  - 0 Dermatologic / Skin Assessment (not related to wound area) Prout, [name] ([id number]) [id number] Page 2 of 9 ASSESSMENTS - Ostomy and/or Continence Assessment and Care []  - 0 Incontinence Assessment and Management []  - 0 Ostomy Care Assessment and Management (repouching, etc.) PROCESS - Coordination of Care []  - 0 Simple Patient / Family Education for ongoing care []  - 0 Complex (extensive) Patient / Family Education for ongoing care []  - 0 Staff obtains Chiropractor, Records, T Results / Process Orders est []  - 0 Staff telephones HHA, Nursing Homes / Clarify orders / etc []  - 0 Routine Transfer to another Facility (non-emergent condition) []  - 0 Routine Hospital Admission (non-emergent condition) []  - 0 New Admissions / Manufacturing engineer / Ordering NPWT Apligraf, etc. , []  - 0 Emergency Hospital Admission (emergent condition) PROCESS - Special Needs []  - 0 Pediatric / Minor Patient Management []  - 0 Isolation Patient Management []  - 0 Hearing / Language / Visual special needs []  - 0 Assessment of Community assistance (transportation, D/C planning, etc.) []  - 0 Additional assistance / Altered mentation []  - 0 Support Surface(s) Assessment (bed, cushion, seat, etc.) INTERVENTIONS - Miscellaneous []  - 0 External ear exam []  - 0 Patient Transfer (multiple staff / Nurse, adult / Similar devices) []  - 0 Simple Staple / Suture removal (25 or less) []  - 0 Complex Staple / Suture removal (26 or more) []  - 0 Hypo/Hyperglycemic Management (do not check if billed separately) []  - 0 Ankle / Brachial Index (ABI) - do not check if billed separately Has the patient been seen at the hospital within the last three years: Yes Total  Score: 0 Level Of Care: ____ Electronic Signature(s) Signed: 02/19/2023 12:46:12 PM By: Midge Aver MSN RN CNS WTA Entered  By: Midge Aver on 02/17/2023 08:18:12 -------------------------------------------------------------------------------- Compression Therapy Details Patient Name: Date of Service: [name], [name]. 02/17/2023 7:45 A M Medical Record Number: [id number] Patient Account Number: [id number] Date of Birth/Sex: Treating RN: [birth year] (67 y.o. [name] Primary Care Donnah Levert: Saralyn Pilar Other Clinician: Referring Avis Tirone: Treating Cola Highfill/Extender: Tiburcio Pea in Treatment: 13 Compression Therapy Performed for Wound Assessment: Wound #1 Right,Lateral Lower Leg Performed By: Clinician Midge Aver, RN Compression Type: Four Regenia Skeeter ([id number]) [id number] Page 3 of 9 Post Procedure Diagnosis Same as Pre-procedure Electronic Signature(s) Signed: 02/19/2023 12:46:12 PM By: Midge Aver MSN RN CNS WTA Entered By: Midge Aver on 02/17/2023 08:17:34 -------------------------------------------------------------------------------- Encounter Discharge Information Details Patient Name: Date of Service: [name], [name] 02/17/2023 7:45 A M Medical Record Number: [id number] Patient Account Number: [id number] Date of Birth/Sex: Treating RN: 13-May-1955 (67 y.o. [name] Primary Care Kandas Oliveto: Saralyn Pilar Other Clinician: Referring Mishaal Lansdale: Treating Artin Mceuen/Extender: Tiburcio Pea in Treatment: 13 Encounter Discharge Information Items Post Procedure Vitals Discharge Condition: Stable Temperature (F): 98.1 Ambulatory Status: Ambulatory Pulse (bpm): 61 Discharge Destination: Home Respiratory Rate (breaths/min): 18 Transportation: Private Auto Blood Pressure (mmHg): 136/79 Accompanied By: wife Schedule Follow-up Appointment: Yes Clinical  Summary of Care: Electronic Signature(s) Signed: 02/19/2023 12:46:12 PM By: Midge Aver MSN RN CNS WTA Entered By: Midge Aver on 02/17/2023 08:19:45 -------------------------------------------------------------------------------- Lower Extremity Assessment Details Patient Name: Date of Service: [name], [name] 02/17/2023 7:45 A M Medical Record Number: [id number] Patient Account Number: [id number] Date of Birth/Sex: Treating RN: 10-26-1955 (67 y.o. [name] Primary Care Wrigley Plasencia: Saralyn Pilar Other Clinician: Referring Kimisha Eunice: Treating Ishmail Mcmanamon/Extender: Tiburcio Pea in Treatment: 13 Edema Assessment Assessed: Kyra Searles: No] Franne Forts: No] [Left: Edema] Franne Forts: :] Pearletha Forge, [name] ([id number]) [id number] Page 4 of 9 Left: Right: Point of Measurement: 32 cm From Medial Instep 39 cm Ankle Left: Right: Point of Measurement: 11 cm From Medial Instep 29 cm Vascular Assessment Pulses: Dorsalis Pedis Palpable: [Right:Yes] Extremity colors, hair growth, and conditions: Extremity Color: [Right:Hyperpigmented] Hair Growth on Extremity: [Right:No] Temperature of Extremity: [Right:Warm] Capillary Refill: [Right:< 3 seconds] Dependent Rubor: [Right:No No] Toe Nail Assessment Left: Right: Thick: No Discolored: No Deformed: No Improper Length and Hygiene: No Electronic Signature(s) Signed: 02/19/2023 12:46:12 PM By: Midge Aver MSN RN CNS WTA Entered By: Midge Aver on 02/17/2023 08:11:57 -------------------------------------------------------------------------------- Multi Wound Chart Details Patient Name: Date of Service: [name], [name] 02/17/2023 7:45 A M Medical Record Number: [id number] Patient Account Number: [id number] Date of Birth/Sex: Treating RN: [birth year] (67 y.o. [name] Primary Care Tove Wideman: Saralyn Pilar Other Clinician: Referring Luda Charbonneau: Treating Ivelise Castillo/Extender:  Tiburcio Pea in Treatment: 13 Vital Signs Height(in): 67 Pulse(bpm): 61 Weight(lbs): 174 Blood Pressure(mmHg): 136/79 Body Mass Index(BMI): 27.2 Temperature(F): 98.1 Respiratory Rate(breaths/min): 18 [1:Photos:] [N/A:N/A] Right, Lateral Lower Leg N/A N/A Wound Location: JOHANNES, DADDIO ([id number]) [id number] Page 5 of 9 Gradually Appeared N/A N/A Wounding Event: Atypical N/A N/A Primary Etiology: Asthma, Hypertension N/A N/A Comorbid History: 08/29/2022 N/A N/A Date Acquired: 59 N/A N/A Weeks of Treatment: Open N/A N/A Wound Status: No N/A N/A Wound Recurrence: Yes N/A N/A Clustered Wound: 10.4x5x0.5 N/A N/A Measurements L x W x D (cm) 40.841 N/A N/A A (cm) : rea 20.42 N/A N/A Volume (cm) : 57.00% N/A N/A % Reduction in A rea: 46.30% N/A N/A % Reduction in Volume: Full Thickness Without Exposed N/A N/A Classification: Support Structures  Medium N/A N/A Exudate A mount: Serosanguineous N/A N/A Exudate Type: red, brown N/A N/A Exudate Color: None Present (0%) N/A N/A Granulation Amount: Large (67-100%) N/A N/A Necrotic Amount: Eschar N/A N/A Necrotic Tissue: Fat Layer (Subcutaneous Tissue): Yes N/A N/A Exposed Structures: Fascia: No Tendon: No Muscle: No Joint: No Bone: No None N/A N/A Epithelialization: Treatment Notes Electronic Signature(s) Signed: 02/19/2023 12:46:12 PM By: Midge Aver MSN RN CNS WTA Entered By: Midge Aver on 02/17/2023 08:15:51 -------------------------------------------------------------------------------- Multi-Disciplinary Care Plan Details Patient Name: Date of Service: [name], Adorian M. 02/17/2023 7:45 A M Medical Record Number: [id number] Patient Account Number: [id number] Date of Birth/Sex: Treating RN: Dec 22, 1955 (67 y.o. [name] Primary Care Breawna Montenegro: Saralyn Pilar Other Clinician: Referring Srihari Shellhammer: Treating Eather Chaires/Extender: Tiburcio Pea in Treatment: 13 Active Inactive Necrotic Tissue Nursing Diagnoses: Knowledge deficit related to management of necrotic/devitalized tissue Goals: Patient/caregiver will verbalize understanding of reason and process for debridement of necrotic tissue Date Initiated: 11/18/2022 Target Resolution Date: 03/20/2023 Goal Status: Active Interventions: Assess patient pain level pre-, during and post procedure and prior to discharge Notes: Wound/Skin Impairment KIERIN, TEMPEST ([id number]) [id number] Page 6 of 9 Nursing Diagnoses: Knowledge deficit related to ulceration/compromised skin integrity Goals: Patient/caregiver will verbalize understanding of skin care regimen Date Initiated: 11/18/2022 Date Inactivated: 12/30/2022 Target Resolution Date: 12/19/2022 Goal Status: Met Ulcer/skin breakdown will have a volume reduction of 30% by week 4 Date Initiated: 11/18/2022 Date Inactivated: 12/30/2022 Target Resolution Date: 12/19/2022 Goal Status: Met Ulcer/skin breakdown will have a volume reduction of 50% by week 8 Date Initiated: 11/18/2022 Date Inactivated: 01/13/2023 Target Resolution Date: 01/18/2023 Goal Status: Met Ulcer/skin breakdown will have a volume reduction of 80% by week 12 Date Initiated: 11/18/2022 Date Inactivated: 02/17/2023 Target Resolution Date: 02/18/2023 Goal Status: Met Ulcer/skin breakdown will heal within 14 weeks Date Initiated: 11/18/2022 Target Resolution Date: 03/20/2023 Goal Status: Active Interventions: Assess patient/caregiver ability to obtain necessary supplies Assess patient/caregiver ability to perform ulcer/skin care regimen upon admission and as needed Assess ulceration(s) every visit Notes: Electronic Signature(s) Signed: 02/19/2023 12:46:12 PM By: Midge Aver MSN RN CNS WTA Entered By: Midge Aver on 02/17/2023  08:18:35 -------------------------------------------------------------------------------- Pain Assessment Details Patient Name: Date of Service: [name], [name] 02/17/2023 7:45 A M Medical Record Number: [id number] Patient Account Number: [id number] Date of Birth/Sex: Treating RN: 1955-05-11 (67 y.o. [name] Primary Care Miarose Lippert: Saralyn Pilar Other Clinician: Referring Araly Kaas: Treating Barett [name]/Extender: Tiburcio Pea in Treatment: 13 Active Problems Location of Pain Severity and Description of Pain Patient Has Paino No Site Locations [place], [place] ([id number]) [id number] Page 7 of 9 Pain Management and Medication Current Pain Management: Electronic Signature(s) Signed: 02/19/2023 12:46:12 PM By: Midge Aver MSN RN CNS WTA Entered By: Midge Aver on 02/17/2023 07:59:46 -------------------------------------------------------------------------------- Patient/Caregiver Education Details Patient Name: Date of Service: [name] 11/20/2024andnbsp7:45 A M Medical Record Number: [id number] Patient Account Number: [id number] Date of Birth/Gender: Treating RN: June 27, 1955 (67 y.o. [name] Primary Care Physician: Saralyn Pilar Other Clinician: Referring Physician: Treating Physician/Extender: Tiburcio Pea in Treatment: 13 Education Assessment Education Provided To: Patient Education Topics Provided Wound Debridement: Handouts: Wound Debridement Methods: Explain/Verbal Responses: State content correctly Wound/Skin Impairment: Handouts: Caring for Your Ulcer Methods: Explain/Verbal Responses: State content correctly Electronic Signature(s) Signed: 02/19/2023 12:46:12 PM By: Midge Aver MSN RN CNS WTA Entered By: Midge Aver on 02/17/2023 08:18:53 Scow, [name] ([id number]) [id number] Page 8 of  9 -------------------------------------------------------------------------------- Wound  Assessment Details Patient Name: Date of Service: [name], [name]. 02/17/2023 7:45 A M Medical Record Number: [id number] Patient Account Number: [id number] Date of Birth/Sex: Treating RN: [birth year] (67 y.o. [name] Primary Care Rainen Vanrossum: Saralyn Pilar Other Clinician: Referring Franchon Ketterman: Treating Tamia Dial/Extender: Tiburcio Pea in Treatment: 13 Wound Status Wound Number: 1 Primary Etiology: Atypical Wound Location: Right, Lateral Lower Leg Wound Status: Open Wounding Event: Gradually Appeared Comorbid History: Asthma, Hypertension Date Acquired: 08/29/2022 Weeks Of Treatment: 13 Clustered Wound: Yes Photos Wound Measurements Length: (cm) 10.4 Width: (cm) 5 Depth: (cm) 0.5 Area: (cm) 40.841 Volume: (cm) 20.42 % Reduction in Area: 57% % Reduction in Volume: 46.3% Epithelialization: None Wound Description Classification: Full Thickness Without Exposed Suppor Exudate Amount: Medium Exudate Type: Serosanguineous Exudate Color: red, brown t Structures Foul Odor After Cleansing: No Slough/Fibrino Yes Wound Bed Granulation Amount: None Present (0%) Exposed Structure Necrotic Amount: Large (67-100%) Fascia Exposed: No Necrotic Quality: Eschar Fat Layer (Subcutaneous Tissue) Exposed: Yes Tendon Exposed: No Muscle Exposed: No Joint Exposed: No Bone Exposed: No Treatment Notes Wound #1 (Lower Leg) Wound Laterality: Right, Lateral Cleanser Vashe 5.8 (oz) Discharge Instruction: Use vashe 5.8 (oz) as directed Peri-Wound Care Seat, [name] ([id number]) [id number] Page 9 of 9 Topical Primary Dressing Hydrofera Blue Ready Transfer Foam, 4x5 (in/in) Discharge Instruction: Apply Hydrofera Blue Ready to wound bed as directed Secondary Dressing ABD Pad 5x9 (in/in) Discharge Instruction: Cover with ABD pad Secured  With Compression Wrap Urgo K2, two layer compression system, regular Compression Stockings Add-Ons Electronic Signature(s) Signed: 02/19/2023 12:46:12 PM By: Midge Aver MSN RN CNS WTA Entered By: Midge Aver on 02/17/2023 08:08:26 -------------------------------------------------------------------------------- Vitals Details Patient Name: Date of Service: [name], [name] 02/17/2023 7:45 A M Medical Record Number: [id number] Patient Account Number: [id number] Date of Birth/Sex: Treating RN: 1955/11/13 (67 y.o. [name] Primary Care Keen Ewalt: Saralyn Pilar Other Clinician: Referring Shariya Gaster: Treating Steed Kanaan/Extender: Tiburcio Pea in Treatment: 13 Vital Signs Time Taken: 07:57 Temperature (F): 98.1 Height (in): 67 Pulse (bpm): 61 Weight (lbs): 174 Respiratory Rate (breaths/min): 18 Body Mass Index (BMI): 27.2 Blood Pressure (mmHg): 136/79 Reference Range: 80 - 120 mg / dl Electronic Signature(s) Signed: 02/19/2023 12:46:12 PM By: Midge Aver MSN RN CNS WTA Entered By: Midge Aver on 02/17/2023 07:59:37

## 2023-02-24 ENCOUNTER — Encounter: Payer: BC Managed Care – PPO | Admitting: Physician Assistant

## 2023-02-24 DIAGNOSIS — I872 Venous insufficiency (chronic) (peripheral): Secondary | ICD-10-CM | POA: Diagnosis not present

## 2023-02-24 DIAGNOSIS — L97819 Non-pressure chronic ulcer of other part of right lower leg with unspecified severity: Secondary | ICD-10-CM | POA: Diagnosis not present

## 2023-02-24 DIAGNOSIS — G8929 Other chronic pain: Secondary | ICD-10-CM | POA: Diagnosis not present

## 2023-02-24 DIAGNOSIS — S81801A Unspecified open wound, right lower leg, initial encounter: Secondary | ICD-10-CM | POA: Diagnosis not present

## 2023-02-24 DIAGNOSIS — I10 Essential (primary) hypertension: Secondary | ICD-10-CM | POA: Diagnosis not present

## 2023-02-24 DIAGNOSIS — L97818 Non-pressure chronic ulcer of other part of right lower leg with other specified severity: Secondary | ICD-10-CM | POA: Diagnosis not present

## 2023-02-24 DIAGNOSIS — I87311 Chronic venous hypertension (idiopathic) with ulcer of right lower extremity: Secondary | ICD-10-CM | POA: Diagnosis not present

## 2023-02-24 NOTE — Progress Notes (Signed)
[name], [name] ([id number]) [id number] Page 1 of 7 Visit Report for 02/24/2023 Chief Complaint Document Details Patient Name: Date of Service: [name], [name] 02/24/2023 7:45 [name] Medical Record Number: [id number] Patient Account Number: [id number] Date of Birth/Sex: Treating RN: [birth year] (67 y.o. [name] Primary Care Provider: Saralyn Pilar Other Clinician: Referring Provider: Treating Provider/Extender: Tiburcio Pea in Treatment: 14 Information Obtained from: Patient Chief Complaint Right lower extremity ulcer Electronic Signature(s) Signed: 02/24/2023 8:00:31 AM By: Allen Derry PA-C Entered By: Allen Derry on 02/24/2023 05:00:31 -------------------------------------------------------------------------------- HPI Details Patient Name: Date of Service: [name], [name]. 02/24/2023 7:45 [name] Medical Record Number: [id number] Patient Account Number: [id number] Date of Birth/Sex: Treating RN: [birth year] (67 y.o. [name] Primary Care Provider: Saralyn Pilar Other Clinician: Referring Provider: Treating Provider/Extender: Tiburcio Pea in Treatment: 14 History of Present Illness HPI Description: 11/18/2022 [name] is a 67 year old male with a past medical history of nerve conduction deafness that presents to the clinic for a 7 month history of nonhealing ulcers to the right lower extremity. He states these happened spontaneously and have progressively gotten worse. The more proximal wound has been present for 7 months and the most recent wound on the top of the foot/distal leg has been present for the past month. He has 5 wounds total. He visited the ED on 11/04/2022 for this issue. He was given cephalexin for which he completed. He has been using Several ointments at different times to address this issue. These ointments include Silvadene, mupirocin and clobetasol..  At times he keeps the wounds open to air. He also states he will go in the pool with the wounds exposed. For work he is on his feet for long periods of time. He does not wear compression stockings. ABIs in office were 0.8. He currently denies signs of infection. 8/28; patient presents for follow-up. He has been using Vashe wet-to-dry dressings to the wound beds. Patient's wound culture showed Pseudomonas aeruginosa sensitive to ciprofloxacin and this was sent into the pharmacy. Also recommended Keystone antibiotic ointment And this has already been ordered. He has not received this yet. He had a skin biopsy done as well that showed fibrosis inflammation. This was nonspecific. Patient declined debridement due to chronic pain. 9/4; patient presents for follow-up. He has been using Vashe wet-to-dry dressings to the wound beds. He obtains the [facility] antibiotic ointment tomorrow. He has 1 day left of his oral antibiotics. There has been improvement in size since last clinic visit to the wound beds. He currently denies systemic signs of infection. [name], [name] ([id number]) [id number] Page 2 of 7 9/11; patient presents for follow-up. He has been using Keystone antibiotic ointment to the wound beds. He has tolerated this treatment well. Wound measurements are stable however periwound appears less irritated. 9/18; patient using Keystone and the ABD pad and Tubigrip. Unfortunately the Keystone cream coalesced over the surface of the wounds requiring an aggressive debridement to remove it. Under the [facility] the surface of the wound was not viable requiring further aggressive debridement. The patient has 4 wounds on the right lateral leg which are venous in etiology and a clustered localized area 9/25; this patient has a difficult cluster of wounds on the right lateral lower leg and ankle. Very painful. We have been using Keystone, Hydrofera Blue and Tubigrip compression. His ABI  is 0.8 in this clinic. As far as I can see he is not seen vein  and vascular for reflux studies. A biopsy was done in our clinic that just showed scar tissue no other identifiable pathology. 10/2; this patient has a cluster of wounds on the right lateral lower leg and ankle. He has surrounding stasis dermatitis no doubt chronic venous insufficiency with hemosiderin deposition. I have also been concerned about the difficulty feeling his posterior tibial and dorsalis pedis pulses although his ABI in the clinic was 0.8. He definitely has arterial studies on Thursday, I thought he might have venous reflux studies at the same time if not he is going to need these [name]. Likely will need to see vascular surgery as well. We have been using [facility] ABDs and Tubigrip x 2 10/9 the patient is gone for his vascular testing. Fortunately the arterial studies were fairly normal although his ABI was noncompressible at 1.32 on the right is TBI at 0.92 is normal. Triphasic and biphasic waveforms. On the left ABI was normal triphasic waveforms and normal TBI in terms of the venous reflux studies surprisingly benign. He had no evidence of a DVT no superficial throat thrombosis. He did have venous reflux noted in the right greater saphenous vein in the calf although this is not the drainage area where his current wounds are on the right lateral calf. There was no reflux in the small saphenous vein. We are using Sorbact and hydrogel as the primary dressing also using Keystone. 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 Wounds are better this week and he tolerated the Urgo K2 compression well set it was comfortable and had no issues with it. Wound surfaces are better and there is less surrounding  erythema around this cluster of difficult wounds on the right lateral ankle and lower leg 10/23; appointment with the vascular surgery on November 5. Question is would he benefit from a greater saphenous vein ablation with regards to helping these wounds heal. I was hoping for a better outcome today however he has a yellowish gritty surface requiring debridement. We have been using Sorbact hydrogel. He was denied for Apligraf we will put TheraSkin through his [employer] based [facility] [name]. Not sure if this is a class the mile or not. He has not had any pain and says things feel better. He is tolerating the full 4-layer equivalent compression 10/30; appointment with vascular surgery on November 5. He comes in today with a fair amount of drainage here. He had 4 wounds in this area with significant surrounding stasis dermatitis they have coalesced now into 3 wounds. We have been using Sorbact and hydrogel. He was denied for Apligraf and we are still waiting to hear about TheraSkin although this may be a class the denile. He seemed to be doing well at the beginning of the month after putting TCA in the 4- layer compression on him however he now seems to have plateaued. 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 His wife is asking about changing this at home again when we were using Tubigrip's. His arterial studies were previously reasonably unremarkable. 02-03-2023 upon evaluation today patient appears to be doing pretty well currently in regard to his wound. He actually did see vascular yesterday and they actually discussed a venous ablation surgery for him. I  do think that this will be appropriate and I think it would help him as well. Fortunately I do not see any signs of active infection locally or systemically which is good news. There is some need for some sharp debridement but in general he seems to be making good progress here. 02-10-2023 upon evaluation today  patient's wound does show signs of quite a bit of drainage but I do not see obvious signs of infection at this time I discussed that with him today. With that being said I think that the silver cell may be trapping a lot of fluid up underneath the edge of this and I think if we were to switch something out to say [facility] this may actually do better. I discussed that with him today. 02-17-2023 upon evaluation today patient appears to be doing well currently in regard to his wounds. He has been tolerating the dressing changes I did have to perform some debridement today wound actually looks significantly better I am actually very pleased with where we stand I do believe that he is making excellent headway towards closure. 02-24-2023 upon evaluation today patient appears to be doing well currently in regard to his wound which is showing some signs of improvement which is excellent news. Fortunately I do not see any signs of active infection looking or systemically which is great news as well. No fevers, chills, nausea, vomiting, or diarrhea. We have gotten approval for TheraSkin but there was some issues with the provider in network status. Specifically with integrated wound specialist. Apparently were in network in a couple locations but I got Velna Hatchet to check it but not with the one that we bill under. Nonetheless I think that this is something that can be fixed and needs to be fixed ASAP. Electronic Signature(s) Signed: 02/24/2023 9:32:10 AM By: Allen Derry PA-C Entered By: Allen Derry on 02/24/2023 06:32:10 -------------------------------------------------------------------------------- Physical Exam Details Patient Name: Date of Service: [name], [name]. 02/24/2023 7:45 [name] Medical Record Number: [id number] Patient Account Number: [id number] Date of Birth/Sex: Treating RN: 1955/09/03 (67 y.o. [name] Primary Care Provider: Saralyn Pilar Other Clinician: Referring  Provider: Treating Provider/Extender: Tiburcio Pea in Treatment: [address] [name] ([id number]) [id number] Page 3 of 7 Constitutional Well-nourished and well-hydrated in no acute distress. Respiratory normal breathing without difficulty. Psychiatric this patient is able to make decisions and demonstrates good insight into disease process. Alert and Oriented x 3. pleasant and cooperative. Notes Upon inspection patient's wound bed actually showed signs of good granulation and epithelization at this point. I did not have to perform any debridement today this actually looks to be doing quite well which is great news. No fevers, chills, nausea, vomiting, or diarrhea. I do believe he would benefit from the TheraSkin to try to get this thing healed as quickly as possible. Electronic Signature(s) Signed: 02/24/2023 9:32:31 AM By: Allen Derry PA-C Entered By: Allen Derry on 02/24/2023 06:32:31 -------------------------------------------------------------------------------- Physician Orders Details Patient Name: Date of Service: [name], [name]. 02/24/2023 7:45 [name] Medical Record Number: [id number] Patient Account Number: [id number] Date of Birth/Sex: Treating RN: 1956-03-23 (67 y.o. 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 Bathing/ Shower/ Hygiene May shower with wound dressing protected with water repellent cover or cast  protector. No tub bath. Anesthetic (Use 'Patient Medications' Section for Anesthetic Order Entry) Lidocaine applied to wound bed Wound Treatment Wound #1 - Lower Leg Wound Laterality: Right, Lateral Cleanser: Vashe 5.8 (oz) Every Other Day/30 Days Discharge Instructions: Use vashe 5.8 (oz) as directed Prim Dressing: Hydrofera Blue Ready Transfer Foam, 4x5 (in/in) Every Other Day/30 Days ary Discharge Instructions: Apply Hydrofera Blue Ready to wound bed as directed Secondary Dressing: ABD Pad 5x9 (in/in) (Generic) Every Other Day/30 Days Discharge Instructions: Cover with ABD pad Compression Wrap: Urgo K2, two layer compression system, regular Every Other Day/30 Days [name], [name] ([id number]) [id number] Page 4 of 7 Electronic Signature(s) Signed: 02/24/2023 4:40:04 PM By: Allen Derry PA-C Signed: 02/24/2023 4:53:00 PM By: Midge Aver MSN RN CNS WTA Entered By: Midge Aver on 02/24/2023 05:17:03 -------------------------------------------------------------------------------- Problem List Details Patient Name: Date of Service: [name], [name]. 02/24/2023 7:45 [name] Medical Record Number: [id number] Patient Account Number: [id number] Date of Birth/Sex: Treating RN: December 22, 1955 (67 y.o. 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 02/24/2023 7:45 [name] Medical Record Number: [id number] Patient Account Number: [id number] Date of Birth/Sex: Treating RN: 12/26/1955 (67 y.o. [name], [name], [name] ([id number]) [id number] Page 5 of 7 Primary Care Provider: Saralyn Pilar Other Clinician: Referring Provider: Treating Provider/Extender: Tiburcio Pea in Treatment: 14 Subjective Chief Complaint Information obtained from Patient Right lower extremity ulcer History of Present Illness (HPI) 11/18/2022 Mr. Lorcan Wellen is a 67 year old male with a past medical history of nerve conduction deafness that presents to the clinic for a 7 month history of nonhealing ulcers to the right lower extremity. He states these happened spontaneously and have progressively gotten worse. The more proximal wound has been present for 7 months and the most recent wound on the top of the foot/distal leg has been present for the past month. He has 5 wounds total. He visited the ED on 11/04/2022 for this issue. He was given cephalexin for which he completed. He has been using Several ointments at different times to address this issue. These ointments include Silvadene, mupirocin and clobetasol.. At times he keeps the wounds open to air. He also states he will go in the pool with the wounds exposed. For work he is on his feet for long periods of time. He does not wear compression stockings. ABIs in office were 0.8. He currently denies signs of infection. 8/28; patient presents for follow-up. He has been using Vashe wet-to-dry dressings to the wound beds. Patient's wound culture showed Pseudomonas aeruginosa sensitive to ciprofloxacin and this was sent into the pharmacy. Also recommended  Keystone antibiotic ointment And this has already been ordered. He has not received this yet. He had a  skin biopsy done as well that showed fibrosis inflammation. This was nonspecific. Patient declined debridement due to chronic pain. 9/4; patient presents for follow-up. He has been using Vashe wet-to-dry dressings to the wound beds. He obtains the [facility] antibiotic ointment tomorrow. He has 1 day left of his oral antibiotics. There has been improvement in size since last clinic visit to the wound beds. He currently denies systemic signs of infection. 9/11; patient presents for follow-up. He has been using Keystone antibiotic ointment to the wound beds. He has tolerated this treatment well. Wound measurements are stable however periwound appears less irritated. 9/18; patient using Keystone and the ABD pad and Tubigrip. Unfortunately the Keystone cream coalesced over the surface of the wounds requiring an aggressive debridement to remove it. Under the [facility] the surface of the wound was not viable requiring further aggressive debridement. The patient has 4 wounds on the right lateral leg which are venous in etiology and a clustered localized area 9/25; this patient has a difficult cluster of wounds on the right lateral lower leg and ankle. Very painful. We have been using Keystone, Hydrofera Blue and Tubigrip compression. His ABI is 0.8 in this clinic. As far as I can see he is not seen vein and vascular for reflux studies. A biopsy was done in our clinic that just showed scar tissue no other identifiable pathology. 10/2; this patient has a cluster of wounds on the right lateral lower leg and ankle. He has surrounding stasis dermatitis no doubt chronic venous insufficiency with hemosiderin deposition. I have also been concerned about the difficulty feeling his posterior tibial and dorsalis pedis pulses although his ABI in the clinic was 0.8. He definitely has arterial studies on Thursday, I  thought he might have venous reflux studies at the same time if not he is going to need these [name]. Likely will need to see vascular surgery as well. We have been using [facility] ABDs and Tubigrip x 2 10/9 the patient is gone for his vascular testing. Fortunately the arterial studies were fairly normal although his ABI was noncompressible at 1.32 on the right is TBI at 0.92 is normal. Triphasic and biphasic waveforms. On the left ABI was normal triphasic waveforms and normal TBI in terms of the venous reflux studies surprisingly benign. He had no evidence of a DVT no superficial throat thrombosis. He did have venous reflux noted in the right greater saphenous vein in the calf although this is not the drainage area where his current wounds are on the right lateral calf. There was no reflux in the small saphenous vein. We are using Sorbact and hydrogel as the primary dressing also using Keystone. 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 Wounds are better this week and he tolerated the Urgo K2 compression well set it was comfortable and had no issues with it. Wound surfaces are better and there is less surrounding erythema around this cluster of difficult wounds on the right lateral ankle and lower leg 10/23; appointment with the vascular surgery on November 5. Question is would he benefit from a greater saphenous vein ablation with regards to helping these wounds heal. I was hoping for a better outcome today however he has a yellowish gritty surface requiring debridement. We have been using Sorbact  hydrogel. He was denied for Apligraf we will put TheraSkin through his [employer] based [name]. Not sure if this is a class the mile or not. He has not had any pain  and says things feel better. He is tolerating the full 4-layer equivalent compression 10/30; appointment with vascular surgery on November 5. He comes in today with a fair amount of drainage here. He had 4 wounds in this area with significant surrounding stasis dermatitis they have coalesced now into 3 wounds. We have been using Sorbact and hydrogel. He was denied for Apligraf and we are still waiting to hear about TheraSkin although this may be a class the denile. He seemed to be doing well at the beginning of the month after putting TCA in the 4- layer compression on him however he now seems to have plateaued. 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 His wife is asking about changing this at home again when we were using Tubigrip's. His arterial studies were previously reasonably unremarkable. 02-03-2023 upon evaluation today patient appears to be doing pretty well currently in regard to his wound. He actually did see vascular yesterday and they actually discussed a venous ablation surgery for him. I do think that this will be appropriate and I think it would help him as well. Fortunately I do not see any signs of active infection locally or systemically which is good news. There is some need for some sharp debridement but in general he seems to be making good progress here. 02-10-2023 upon evaluation today patient's wound does show signs of quite a bit of drainage but I do not see obvious signs of infection at this time I discussed that with him today. With that being said I think that the silver cell may be trapping a lot of fluid up underneath the edge of this and I think if we were to switch something out to say [facility] this may actually do better. I discussed that with him today. 02-17-2023 upon evaluation today patient appears to be doing well currently in regard to his wounds. He has been tolerating the dressing changes I did have to perform some debridement  today wound actually looks significantly better I am actually very pleased with where we stand I do believe that he is making excellent headway towards closure. 02-24-2023 upon evaluation today patient appears to be doing well currently in regard to his wound which is showing some signs of improvement which is excellent news. Fortunately I do not see any signs of active infection looking or systemically which is great news as well. No fevers, chills, nausea, vomiting, or diarrhea. We have gotten approval for TheraSkin but there was some issues with the provider in network status. Specifically with integrated wound specialist. Apparently were in network in a couple locations but I got Velna Hatchet to check it but not with the one that we bill under. Nonetheless I think that this is something that can be fixed and needs to be fixed ASAP. [name], [name] ([id number]) [id number] Page 6 of 7 Objective Constitutional Well-nourished and well-hydrated in no acute distress. Vitals Time Taken: 7:54 AM, Height: 67 in, Weight: 174 lbs, BMI: 27.2, Temperature: 97.5 F, Pulse: 54 bpm, Respiratory Rate: 18 breaths/min, Blood Pressure: 133/82 mmHg. Respiratory normal breathing without difficulty. Psychiatric this patient is able to make decisions and demonstrates good insight into disease process. Alert and Oriented x 3. pleasant and cooperative. General Notes: Upon inspection patient's wound bed  actually showed signs of good granulation and epithelization at this point. I did not have to perform any debridement today this actually looks to be doing quite well which is great news. No fevers, chills, nausea, vomiting, or diarrhea. I do believe he would benefit from the TheraSkin to try to get this thing healed as quickly as possible. Integumentary (Hair, Skin) Wound #1 status is Open. Original cause of wound was Gradually Appeared. The date acquired was: 08/29/2022. The wound has been in  treatment 14 weeks. The wound is located on the Right,Lateral Lower Leg. The wound measures 8cm length x 5cm width x 0.3cm depth; 31.416cm^2 area and 9.425cm^3 volume. There is Fat Layer (Subcutaneous Tissue) exposed. There is a medium amount of serosanguineous drainage noted. There is no granulation within the wound bed. There is a large (67-100%) amount of necrotic tissue within the wound bed including Eschar. 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 Bathing/ Shower/ Hygiene: May shower with wound dressing protected with water repellent cover or cast protector. No tub bath. Anesthetic (Use 'Patient Medications' Section for Anesthetic Order Entry): Lidocaine applied to wound bed WOUND #1: - Lower Leg Wound Laterality: Right, Lateral Cleanser: Vashe 5.8 (oz) Every Other Day/30 Days Discharge Instructions: Use vashe 5.8 (oz) as directed Prim Dressing: Hydrofera Blue Ready Transfer Foam, 4x5 (in/in) Every Other Day/30 Days ary Discharge Instructions: Apply Hydrofera Blue Ready to wound bed as directed Secondary Dressing: ABD Pad 5x9 (in/in) (Generic) Every Other Day/30 Days Discharge Instructions: Cover with ABD pad Com pression Wrap: Urgo K2, two layer compression system, regular Every Other Day/30 Days 1. Based on what I see I do feel like that the patient would benefit from the use of TheraSkin I am hopeful that we can get things approved so that we can get the TheraSkin on him this year my hope is that between now and next week we can get the situation resolved with the end that work status and then subsequently can go and order the TheraSkin for application the second week of December. 2. I would recommend  for now would continue with Hydrofera Blue. 3. Muscle can recommend patient should continue with the ABD pad to cover followed by the Urgo K2 compression wrap. We will see patient back for reevaluation in 1 week here in the clinic. If anything worsens or changes patient will contact our office for additional recommendations. Electronic Signature(s) Signed: 02/24/2023 9:33:10 AM By: Allen Derry PA-C Entered By: Allen Derry on 02/24/2023 06:33:10 [name], [name] ([id number]) [id number] Page 7 of 7 -------------------------------------------------------------------------------- SuperBill Details Patient Name: Date of Service: [name], HOLTHUSEN 02/24/2023 Medical Record Number: [id number] Patient Account Number: [id number] Date of Birth/Sex: Treating RN: [birth year] (67 y.o. 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, unspecified Facility Procedures : CPT4 Code: [id number] Description: 99213 - WOUND CARE VISIT-LEV 3 EST PT Modifier: Quantity: 1 Physician Procedures : CPT4 Code Description Modifier [id number] 99213 - WC PHYS LEVEL 3 - EST PT ICD-10 Diagnosis  Description I87.311 Chronic venous hypertension (idiopathic) with ulcer of right lower extremity L97.818 Non-pressure chronic ulcer of other part of right lower  leg with other specified severity 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

## 2023-02-25 NOTE — Progress Notes (Signed)
MORICE, KUCZKOWSKI ([id number]) [id number] Page 1 of 9 Visit Report for 02/24/2023 Arrival Information Details Patient Name: Date of Service: ADEDEJI, STROSNIDER. 02/24/2023 7:45 A M Medical Record Number: [id number] Patient Account Number: [id number] Date of Birth/Sex: Treating RN: 06/07/1955 (67 y.o. [name] Primary Care Kamile Fassler: Saralyn Pilar Other Clinician: Referring Mansour Balboa: Treating Julee Stoll/Extender: Tiburcio Pea in Treatment: 14 Visit Information History Since Last Visit Added or deleted any medications: No Patient Arrived: Ambulatory Any new allergies or adverse reactions: No Arrival Time: 07:50 Has Dressing in Place as Prescribed: Yes Accompanied By: wife Has Compression in Place as Prescribed: Yes Transfer Assistance: None Pain Present Now: No Patient Identification Verified: Yes Secondary Verification Process Completed: Yes Patient Requires Transmission-Based Precautions: No Patient Has Alerts: Yes Patient Alerts: R ABI 1.32 L ABI 1.24 R TBI .92 L TBI 1.05 Electronic Signature(s) Signed: 02/24/2023 4:53:00 PM By: Midge Aver MSN RN CNS WTA Entered By: Midge Aver on 02/24/2023 07:51:00 -------------------------------------------------------------------------------- Clinic Level of Care Assessment Details Patient Name: Date of Service: GILFORD, HOCKERSMITH. 02/24/2023 7:45 A M Medical Record Number: [id number] Patient Account Number: [id number] Date of Birth/Sex: Treating RN: 03-22-1956 (67 y.o. [name] Primary Care Eura Radabaugh: Saralyn Pilar Other Clinician: Referring Diogenes Whirley: Treating Kellie Chisolm/Extender: Tiburcio Pea in Treatment: 14 Clinic Level of Care Assessment Items TOOL 4 Quantity Score X- 1 0 Use when only an EandM is performed on FOLLOW-UP visit ASSESSMENTS - Nursing Assessment / Reassessment X- 1 10 Reassessment of Co-morbidities (includes updates  in patient status) X- 1 5 Reassessment of Adherence to Treatment Plan ASSESSMENTS - Wound and Skin A ssessment / Reassessment X - Simple Wound Assessment / Reassessment - one wound 1 5 Garbers, Latoya M ([id number]) [id number] Page 2 of 9 []  - 0 Complex Wound Assessment / Reassessment - multiple wounds []  - 0 Dermatologic / Skin Assessment (not related to wound area) ASSESSMENTS - Focused Assessment []  - 0 Circumferential Edema Measurements - multi extremities []  - 0 Nutritional Assessment / Counseling / Intervention []  - 0 Lower Extremity Assessment (monofilament, tuning fork, pulses) []  - 0 Peripheral Arterial Disease Assessment (using hand held doppler) ASSESSMENTS - Ostomy and/or Continence Assessment and Care []  - 0 Incontinence Assessment and Management []  - 0 Ostomy Care Assessment and Management (repouching, etc.) PROCESS - Coordination of Care X - Simple Patient / Family Education for ongoing care 1 15 []  - 0 Complex (extensive) Patient / Family Education for ongoing care X- 1 10 Staff obtains Chiropractor, Records, T Results / Process Orders est []  - 0 Staff telephones HHA, Nursing Homes / Clarify orders / etc []  - 0 Routine Transfer to another Facility (non-emergent condition) []  - 0 Routine Hospital Admission (non-emergent condition) []  - 0 New Admissions / Manufacturing engineer / Ordering NPWT Apligraf, etc. , []  - 0 Emergency Hospital Admission (emergent condition) X- 1 10 Simple Discharge Coordination []  - 0 Complex (extensive) Discharge Coordination PROCESS - Special Needs []  - 0 Pediatric / Minor Patient Management []  - 0 Isolation Patient Management []  - 0 Hearing / Language / Visual special needs []  - 0 Assessment of Community assistance (transportation, D/C planning, etc.) []  - 0 Additional assistance / Altered mentation []  - 0 Support Surface(s) Assessment (bed, cushion, seat, etc.) INTERVENTIONS - Wound Cleansing /  Measurement X - Simple Wound Cleansing - one wound 1 5 []  - 0 Complex Wound Cleansing - multiple wounds X- 1 5 Wound Imaging (photographs - any number of  wounds) []  - 0 Wound Tracing (instead of photographs) X- 1 5 Simple Wound Measurement - one wound []  - 0 Complex Wound Measurement - multiple wounds INTERVENTIONS - Wound Dressings []  - 0 Small Wound Dressing one or multiple wounds X- 1 15 Medium Wound Dressing one or multiple wounds []  - 0 Large Wound Dressing one or multiple wounds X- 1 5 Application of Medications - topical []  - 0 Application of Medications - injection INTERVENTIONS - Miscellaneous []  - 0 External ear exam []  - 0 Specimen Collection (cultures, biopsies, blood, body fluids, etc.) []  - 0 Specimen(s) / Culture(s) sent or taken to Lab for analysis JUWANN, SWANSEN ([id number]) [id number] Page 3 of 9 []  - 0 Patient Transfer (multiple staff / Nurse, adult / Similar devices) []  - 0 Simple Staple / Suture removal (25 or less) []  - 0 Complex Staple / Suture removal (26 or more) []  - 0 Hypo / Hyperglycemic Management (close monitor of Blood Glucose) []  - 0 Ankle / Brachial Index (ABI) - do not check if billed separately X- 1 5 Vital Signs Has the patient been seen at the hospital within the last three years: Yes Total Score: 95 Level Of Care: New/Established - Level 3 Electronic Signature(s) Signed: 02/24/2023 4:53:00 PM By: Midge Aver MSN RN CNS WTA Entered By: Midge Aver on 02/24/2023 08:22:33 -------------------------------------------------------------------------------- Encounter Discharge Information Details Patient Name: Date of Service: [name], Bronson M. 02/24/2023 7:45 A M Medical Record Number: [id number] Patient Account Number: [id number] Date of Birth/Sex: Treating RN: 09/08/1955 (67 y.o. [name] Primary Care Kaina Orengo: Saralyn Pilar Other Clinician: Referring Elbie Statzer: Treating Chimere Klingensmith/Extender:  Tiburcio Pea in Treatment: 14 Encounter Discharge Information Items Discharge Condition: Stable Ambulatory Status: Ambulatory Discharge Destination: Home Transportation: Private Auto Accompanied By: wife Schedule Follow-up Appointment: Yes Clinical Summary of Care: Electronic Signature(s) Signed: 02/24/2023 4:53:00 PM By: Midge Aver MSN RN CNS WTA Entered By: Midge Aver on 02/24/2023 08:23:51 -------------------------------------------------------------------------------- Lower Extremity Assessment Details Patient Name: Date of Service: [name], Rishawn M. 02/24/2023 7:45 A M Medical Record Number: [id number] Patient Account Number: [id number] Date of Birth/Sex: Treating RN: May 01, 1955 (66 y.o. [name] Primary Care Kalii Chesmore: Saralyn Pilar Other Clinician: WOODLEY, LANDT ([id number]) [id number] Page 4 of 9 Referring Aithan Farrelly: Treating Elvert Cumpton/Extender: Tiburcio Pea in Treatment: 14 Edema Assessment Assessed: [Left: No] [Right: No] [Left: Edema] [Right: :] Calf Left: Right: Point of Measurement: 32 cm From Medial Instep 39 cm Ankle Left: Right: Point of Measurement: 11 cm From Medial Instep 29 cm Vascular Assessment Pulses: Dorsalis Pedis Palpable: [Right:Yes] Extremity colors, hair growth, and conditions: Extremity Color: [Right:Hyperpigmented] Hair Growth on Extremity: [Right:No] Temperature of Extremity: [Right:Warm] Capillary Refill: [Right:< 3 seconds] Dependent Rubor: [Right:No] Blanched when Elevated: [Right:No No] Toe Nail Assessment Left: Right: Thick: No Discolored: No Deformed: No Improper Length and Hygiene: No Electronic Signature(s) Signed: 02/24/2023 4:53:00 PM By: Midge Aver MSN RN CNS WTA Entered By: Midge Aver on 02/24/2023 08:11:04 -------------------------------------------------------------------------------- Multi Wound Chart  Details Patient Name: Date of Service: [name], Antawn M. 02/24/2023 7:45 A M Medical Record Number: [id number] Patient Account Number: [id number] Date of Birth/Sex: Treating RN: 1955-04-18 (67 y.o. [name] Primary Care Shenoa Hattabaugh: Saralyn Pilar Other Clinician: Referring Bricelyn Freestone: Treating Demyan Fugate/Extender: Tiburcio Pea in Treatment: 14 Vital Signs Height(in): 67 Pulse(bpm): 54 Weight(lbs): 174 Blood Pressure(mmHg): 133/82 Body Mass Index(BMI): 27.2 Temperature(F): 97.5 Respiratory Rate(breaths/min): 18 Fadely, Sohail M ([id number]) [1:Photos:] [N/A:N/A] Right, Lateral Lower Leg  N/A N/A Wound Location: Gradually Appeared N/A N/A Wounding Event: Atypical N/A N/A Primary Etiology: Asthma, Hypertension N/A N/A Comorbid History: 08/29/2022 N/A N/A Date Acquired: 14 N/A N/A Weeks of Treatment: Open N/A N/A Wound Status: No N/A N/A Wound Recurrence: Yes N/A N/A Clustered Wound: 8x5x0.3 N/A N/A Measurements L x W x D (cm) 31.416 N/A N/A A (cm) : rea 9.425 N/A N/A Volume (cm) : 66.90% N/A N/A % Reduction in A rea: 75.20% N/A N/A % Reduction in Volume: Full Thickness Without Exposed N/A N/A Classification: Support Structures Medium N/A N/A Exudate A mount: Serosanguineous N/A N/A Exudate Type: red, brown N/A N/A Exudate Color: None Present (0%) N/A N/A Granulation Amount: Large (67-100%) N/A N/A Necrotic Amount: Eschar N/A N/A Necrotic Tissue: Fat Layer (Subcutaneous Tissue): Yes N/A N/A Exposed Structures: Fascia: No Tendon: No Muscle: No Joint: No Bone: No None N/A N/A Epithelialization: Treatment Notes Electronic Signature(s) Signed: 02/24/2023 4:53:00 PM By: Midge Aver MSN RN CNS WTA Entered By: Midge Aver on 02/24/2023 08:15:59 -------------------------------------------------------------------------------- Multi-Disciplinary Care Plan Details Patient Name: Date of Service: [name], Luisangel M. 02/24/2023  7:45 A M Medical Record Number: [id number] Patient Account Number: [id number] Date of Birth/Sex: Treating RN: 01-17-1956 (67 y.o. [name] Primary Care Tyronda Vizcarrondo: Saralyn Pilar Other Clinician: Referring Anabela Crayton: Treating Karole Oo/Extender: Tiburcio Pea in Treatment: 14 Active Inactive Necrotic Tissue Nursing Diagnoses: Knowledge deficit related to management of necrotic/devitalized tissue CORDERO, DIROSA ([id number]) [id number] Page 6 of 9 Goals: Patient/caregiver will verbalize understanding of reason and process for debridement of necrotic tissue Date Initiated: 11/18/2022 Target Resolution Date: 03/20/2023 Goal Status: Active Interventions: Assess patient pain level pre-, during and post procedure and prior to discharge Notes: Wound/Skin Impairment Nursing Diagnoses: Knowledge deficit related to ulceration/compromised skin integrity Goals: Patient/caregiver will verbalize understanding of skin care regimen Date Initiated: 11/18/2022 Date Inactivated: 12/30/2022 Target Resolution Date: 12/19/2022 Goal Status: Met Ulcer/skin breakdown will have a volume reduction of 30% by week 4 Date Initiated: 11/18/2022 Date Inactivated: 12/30/2022 Target Resolution Date: 12/19/2022 Goal Status: Met Ulcer/skin breakdown will have a volume reduction of 50% by week 8 Date Initiated: 11/18/2022 Date Inactivated: 01/13/2023 Target Resolution Date: 01/18/2023 Goal Status: Met Ulcer/skin breakdown will have a volume reduction of 80% by week 12 Date Initiated: 11/18/2022 Date Inactivated: 02/17/2023 Target Resolution Date: 02/18/2023 Goal Status: Met Ulcer/skin breakdown will heal within 14 weeks Date Initiated: 11/18/2022 Target Resolution Date: 03/20/2023 Goal Status: Active Interventions: Assess patient/caregiver ability to obtain necessary supplies Assess patient/caregiver ability to perform ulcer/skin care regimen upon  admission and as needed Assess ulceration(s) every visit Notes: Electronic Signature(s) Signed: 02/24/2023 4:53:00 PM By: Midge Aver MSN RN CNS WTA Entered By: Midge Aver on 02/24/2023 08:22:56 -------------------------------------------------------------------------------- Pain Assessment Details Patient Name: Date of Service: [name], Jaclyn M. 02/24/2023 7:45 A M Medical Record Number: [id number] Patient Account Number: [id number] Date of Birth/Sex: Treating RN: 1955-05-18 (67 y.o. [name] Primary Care Laurajean Hosek: Saralyn Pilar Other Clinician: Referring Christien Frankl: Treating Cannan Beeck/Extender: Tiburcio Pea in Treatment: 14 Active Problems Location of Pain Severity and Description of Pain Patient Has Paino No Site Locations [place] [place] ([id number]) [id number] Page 7 of 9 Pain Management and Medication Current Pain Management: Electronic Signature(s) Signed: 02/24/2023 4:53:00 PM By: Midge Aver MSN RN CNS WTA Entered By: Midge Aver on 02/24/2023 07:59:59 -------------------------------------------------------------------------------- Patient/Caregiver Education Details Patient Name: Date of Service: [name] 11/27/2024andnbsp7:45 A M Medical Record Number: [id number] Patient Account Number: [id number] Date  of Birth/Gender: Treating RN: 1955/12/14 (67 y.o. [name] Primary Care Physician: Saralyn Pilar Other Clinician: Referring Physician: Treating Physician/Extender: Tiburcio Pea in Treatment: 14 Education Assessment Education Provided To: Patient Education Topics Provided Wound/Skin Impairment: Handouts: Caring for Your Ulcer Methods: Explain/Verbal Responses: State content correctly Electronic Signature(s) Signed: 02/24/2023 4:53:00 PM By: Midge Aver MSN RN CNS WTA Entered By: Midge Aver on 02/24/2023 08:23:06 Stayer, [name] ([id number])  [id number] Page 8 of 9 -------------------------------------------------------------------------------- Wound Assessment Details Patient Name: Date of Service: MAHAMUD, FRENZ. 02/24/2023 7:45 A M Medical Record Number: [id number] Patient Account Number: [id number] Date of Birth/Sex: Treating RN: 06/22/1955 (67 y.o. [name] Primary Care Jakelyn Squyres: Saralyn Pilar Other Clinician: Referring Otho Michalik: Treating Roseland Braun/Extender: Tiburcio Pea in Treatment: 14 Wound Status Wound Number: 1 Primary Etiology: Atypical Wound Location: Right, Lateral Lower Leg Wound Status: Open Wounding Event: Gradually Appeared Comorbid History: Asthma, Hypertension Date Acquired: 08/29/2022 Weeks Of Treatment: 14 Clustered Wound: Yes Photos Wound Measurements Length: (cm) 8 Width: (cm) 5 Depth: (cm) 0.3 Area: (cm) 31.416 Volume: (cm) 9.425 % Reduction in Area: 66.9% % Reduction in Volume: 75.2% Epithelialization: None Wound Description Classification: Full Thickness Without Exposed Suppor Exudate Amount: Medium Exudate Type: Serosanguineous Exudate Color: red, brown t Structures Foul Odor After Cleansing: No Slough/Fibrino Yes Wound Bed Granulation Amount: None Present (0%) Exposed Structure Necrotic Amount: Large (67-100%) Fascia Exposed: No Necrotic Quality: Eschar Fat Layer (Subcutaneous Tissue) Exposed: Yes Tendon Exposed: No Muscle Exposed: No Joint Exposed: No Bone Exposed: No Treatment Notes Wound #1 (Lower Leg) Wound Laterality: Right, Lateral Cleanser Vashe 5.8 (oz) Discharge Instruction: Use vashe 5.8 (oz) as directed Peri-Wound Care Emig, [name] ([id number]) [id number] Page 9 of 9 Topical Primary Dressing Hydrofera Blue Ready Transfer Foam, 4x5 (in/in) Discharge Instruction: Apply Hydrofera Blue Ready to wound bed as directed Secondary Dressing ABD Pad 5x9 (in/in) Discharge  Instruction: Cover with ABD pad Secured With Compression Wrap Urgo K2, two layer compression system, regular Compression Stockings Add-Ons Electronic Signature(s) Signed: 02/24/2023 4:53:00 PM By: Midge Aver MSN RN CNS WTA Entered By: Midge Aver on 02/24/2023 08:09:57 -------------------------------------------------------------------------------- Vitals Details Patient Name: Date of Service: [name], Azari M. 02/24/2023 7:45 A M Medical Record Number: [id number] Patient Account Number: [id number] Date of Birth/Sex: Treating RN: 10-Oct-1955 (67 y.o. [name] Primary Care Kimmy Totten: Saralyn Pilar Other Clinician: Referring Elyanna Wallick: Treating Brenley Priore/Extender: Tiburcio Pea in Treatment: 14 Vital Signs Time Taken: 07:54 Temperature (F): 97.5 Height (in): 67 Pulse (bpm): 54 Weight (lbs): 174 Respiratory Rate (breaths/min): 18 Body Mass Index (BMI): 27.2 Blood Pressure (mmHg): 133/82 Reference Range: 80 - 120 mg / dl Electronic Signature(s) Signed: 02/24/2023 4:53:00 PM By: Midge Aver MSN RN CNS WTA Entered By: Midge Aver on 02/24/2023 07:59:49

## 2023-03-03 ENCOUNTER — Encounter: Payer: BC Managed Care – PPO | Attending: Internal Medicine

## 2023-03-03 DIAGNOSIS — H908 Mixed conductive and sensorineural hearing loss, unspecified: Secondary | ICD-10-CM | POA: Diagnosis not present

## 2023-03-03 DIAGNOSIS — L97818 Non-pressure chronic ulcer of other part of right lower leg with other specified severity: Secondary | ICD-10-CM | POA: Diagnosis not present

## 2023-03-03 DIAGNOSIS — I87311 Chronic venous hypertension (idiopathic) with ulcer of right lower extremity: Secondary | ICD-10-CM | POA: Diagnosis not present

## 2023-03-03 NOTE — Progress Notes (Signed)
RAEDEN, PASKINS ([id number]) [id number] Page 1 of 3 Visit Report for 03/03/2023 Arrival Information Details Patient Name: Date of Service: [name], [name]. 03/03/2023 7:45 [name] Medical Record Number: [id number] Patient Account Number: [id number] Date of Birth/Sex: Treating RN: 07/19/1955 (67 y.o. [name] Primary Care Tynisha Ogan: Saralyn Pilar Other Clinician: Referring Ayiden Milliman: Treating Khizar Fiorella/Extender: Tiburcio Pea in Treatment: 15 Visit Information History Since Last Visit Added or deleted any medications: No Patient Arrived: Ambulatory Any new allergies or adverse reactions: No Arrival Time: 08:00 Had a fall or experienced change in No Accompanied By: wife activities of daily living that may affect Transfer Assistance: None risk of falls: Patient Identification Verified: Yes Signs or symptoms of abuse/neglect since last visito No Secondary Verification Process Completed: Yes Hospitalized since last visit: No Patient Requires Transmission-Based Precautions: No Has Dressing in Place as Prescribed: Yes Patient Has Alerts: Yes Has Compression in Place as Prescribed: Yes Patient Alerts: R ABI 1.32 L ABI 1.24 Pain Present Now: No R TBI .92 L TBI 1.05 Electronic Signature(s) Signed: 03/03/2023 11:59:16 AM By: Midge Aver MSN RN CNS WTA Entered By: Midge Aver on 03/03/2023 05:42:05 -------------------------------------------------------------------------------- Compression Therapy Details Patient Name: Date of Service: [name], [name]. 03/03/2023 7:45 [name] Medical Record Number: [id number] Patient Account Number: [id number] Date of Birth/Sex: Treating RN: September 01, 1955 (67 y.o. [name] Primary Care Lenix Benoist: Saralyn Pilar Other Clinician: Referring Grisel Blumenstock: Treating Zhion Pevehouse/Extender: RO BSO Dorris Carnes, [name], [name] in Treatment: 15 Compression Therapy Performed for Wound  Assessment: Wound #1 Right,Lateral Lower Leg Performed By: Clinician Midge Aver, RN Compression Type: Three Emergency planning/management officer) Signed: 03/03/2023 11:59:16 AM By: Midge Aver MSN RN CNS WTA Entered By: Midge Aver on 03/03/2023 05:42:35 Rondeau, [name] ([id number]) [id number] Page 2 of 3 -------------------------------------------------------------------------------- Encounter Discharge Information Details Patient Name: Date of Service: [name], [name]. 03/03/2023 7:45 [name] Medical Record Number: [id number] Patient Account Number: [id number] Date of Birth/Sex: Treating RN: [birth year] (67 y.o. [name] Primary Care Jhamari Markowicz: Saralyn Pilar Other Clinician: Referring Elen Acero: Treating Ermias Tomeo/Extender: RO BSO N, [name], [name] in Treatment: 15 Encounter Discharge Information Items Discharge Condition: Stable Ambulatory Status: Ambulatory Discharge Destination: Home Transportation: Private Auto Accompanied By: wife Schedule Follow-up Appointment: Yes Clinical Summary of Care: Electronic Signature(s) Signed: 03/03/2023 11:59:16 AM By: Midge Aver MSN RN CNS WTA Entered By: Midge Aver on 03/03/2023 05:43:33 -------------------------------------------------------------------------------- Wound Assessment Details Patient Name: Date of Service: [name], [name]. 03/03/2023 7:45 [name] Medical Record Number: [id number] Patient Account Number: [id number] Date of Birth/Sex: Treating RN: [birth year] (67 y.o. [name] Primary Care Kendell Gammon: Saralyn Pilar Other Clinician: Referring Alivea Gladson: Treating Joleigh Mineau/Extender: RO BSO N, [name], [name] in Treatment: 15 Wound Status Wound Number: 1 Primary Etiology: Atypical Wound Location: Right, Lateral Lower Leg Wound Status: Open Wounding Event: Gradually Appeared Comorbid History: Asthma, Hypertension Date Acquired:  08/29/2022 Weeks Of Treatment: 15 Clustered Wound: Yes Wound Measurements Length: (cm) 8 Width: (cm) 5 Depth: (cm) 0.3 Area: (cm) 31.416 Volume: (cm) 9.425 % Reduction in Area: 66.9% % Reduction in Volume: 75.2% Epithelialization: None Wound Description Classification: Full Thickness Without Exposed Support Structures [name], [name] ([id number]) Exudate Amount: Medium Exudate Type: Serosanguineous Exudate Color: red, brown Foul Odor After Cleansing: No [id number] Page 3 of 3 Slough/Fibrino Yes Wound Bed Granulation Amount: None Present (0%) Exposed Structure Necrotic Amount: Large (67-100%) Fascia Exposed: No Necrotic Quality: Eschar Fat  Layer (Subcutaneous Tissue) Exposed: Yes Tendon Exposed: No Muscle Exposed: No Joint Exposed: No Bone Exposed: No Treatment Notes Wound #1 (Lower Leg) Wound Laterality: Right, Lateral Cleanser Vashe 5.8 (oz) Discharge Instruction: Use vashe 5.8 (oz) as directed Peri-Wound Care Topical Primary Dressing Hydrofera Blue Ready Transfer Foam, 4x5 (in/in) Discharge Instruction: Apply Hydrofera Blue Ready to wound bed as directed Secondary Dressing ABD Pad 5x9 (in/in) Discharge Instruction: Cover with ABD pad Secured With Compression Wrap Urgo K2, two layer compression system, regular Compression Stockings Add-Ons Electronic Signature(s) Signed: 03/03/2023 11:59:16 AM By: Midge Aver MSN RN CNS WTA Entered By: Midge Aver on 03/03/2023 05:42:21

## 2023-03-04 NOTE — Progress Notes (Signed)
LB, MAUGHAN ([id number]) [id number] Page 1 of 2 Visit Report for 03/03/2023 Physician Orders Details Patient Name: Date of Service: [name], [name]. 03/03/2023 7:45 A M Medical Record Number: [id number] Patient Account Number: [id number] Date of Birth/Sex: Treating RN: 10/12/1955 (67 y.o. [name] Primary Care Provider: Saralyn Pilar Other Clinician: Referring Provider: Treating Provider/Extender: Tiburcio Pea in Treatment: 15 The following information was scribed by: Midge Aver The information was scribed for: Allen Derry Verbal / Phone Orders: No Diagnosis Coding Follow-up Appointments Return Appointment in 1 week. Nurse Visit as needed Bathing/ Shower/ Hygiene May shower with wound dressing protected with water repellent cover or cast protector. No tub bath. Anesthetic (Use 'Patient Medications' Section for Anesthetic Order Entry) Lidocaine applied to wound bed Wound Treatment Wound #1 - Lower Leg Wound Laterality: Right, Lateral Cleanser: Vashe 5.8 (oz) Every Other Day/30 Days Discharge Instructions: Use vashe 5.8 (oz) as directed Prim Dressing: Hydrofera Blue Ready Transfer Foam, 4x5 (in/in) Every Other Day/30 Days ary Discharge Instructions: Apply Hydrofera Blue Ready to wound bed as directed Secondary Dressing: ABD Pad 5x9 (in/in) (Generic) Every Other Day/30 Days Discharge Instructions: Cover with ABD pad Compression Wrap: Urgo K2, two layer compression system, regular Every Other Day/30 Days Electronic Signature(s) Signed: 03/03/2023 11:59:16 AM By: Midge Aver MSN RN CNS WTA Signed: 03/04/2023 7:39:18 AM By: Allen Derry PA-C Entered By: Midge Aver on 03/03/2023 08:43:11 SuperBill Details -------------------------------------------------------------------------------- Judye Bos ([id number]) [id number] Page 2 of 2 Patient Name: Date of Service: [name], [name]  03/03/2023 Medical Record Number: [id number] Patient Account Number: [id number] Date of Birth/Sex: Treating RN: [birth year] (67 y.o. 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

## 2023-03-10 ENCOUNTER — Encounter: Payer: BC Managed Care – PPO | Admitting: Physician Assistant

## 2023-03-10 DIAGNOSIS — L97818 Non-pressure chronic ulcer of other part of right lower leg with other specified severity: Secondary | ICD-10-CM | POA: Diagnosis not present

## 2023-03-10 DIAGNOSIS — I87311 Chronic venous hypertension (idiopathic) with ulcer of right lower extremity: Secondary | ICD-10-CM | POA: Diagnosis not present

## 2023-03-10 DIAGNOSIS — S81801A Unspecified open wound, right lower leg, initial encounter: Secondary | ICD-10-CM | POA: Diagnosis not present

## 2023-03-10 DIAGNOSIS — H908 Mixed conductive and sensorineural hearing loss, unspecified: Secondary | ICD-10-CM | POA: Diagnosis not present

## 2023-03-17 ENCOUNTER — Encounter: Payer: BC Managed Care – PPO | Admitting: Physician Assistant

## 2023-03-17 DIAGNOSIS — L97818 Non-pressure chronic ulcer of other part of right lower leg with other specified severity: Secondary | ICD-10-CM | POA: Diagnosis not present

## 2023-03-17 DIAGNOSIS — H908 Mixed conductive and sensorineural hearing loss, unspecified: Secondary | ICD-10-CM | POA: Diagnosis not present

## 2023-03-17 DIAGNOSIS — L97812 Non-pressure chronic ulcer of other part of right lower leg with fat layer exposed: Secondary | ICD-10-CM | POA: Diagnosis not present

## 2023-03-17 DIAGNOSIS — I87311 Chronic venous hypertension (idiopathic) with ulcer of right lower extremity: Secondary | ICD-10-CM | POA: Diagnosis not present

## 2023-03-17 NOTE — Progress Notes (Signed)
NATANAEL, BERRETTA ([id number]) [id number] Page 1 of 9 Visit Report for 03/17/2023 Arrival Information Details Patient Name: Date of Service: NOBERT, HAMMONS. 03/17/2023 7:45 A M Medical Record Number: [id number] Patient Account Number: [id number] Date of Birth/Sex: Treating RN: 1956/01/27 (67 y.o. [name] Primary Care Cloyce Blankenhorn: Saralyn Pilar Other Clinician: Referring Berlin Mokry: Treating Roseanna Koplin/Extender: Tiburcio Pea in Treatment: 17 Visit Information History Since Last Visit Added or deleted any medications: No Patient Arrived: Ambulatory Any new allergies or adverse reactions: No Arrival Time: 07:49 Had a fall or experienced change in No Accompanied By: wife activities of daily living that may affect Transfer Assistance: None risk of falls: Patient Identification Verified: Yes Hospitalized since last visit: No Secondary Verification Process Completed: Yes Has Dressing in Place as Prescribed: Yes Patient Requires Transmission-Based Precautions: No Has Compression in Place as Prescribed: Yes Patient Has Alerts: Yes Pain Present Now: No Patient Alerts: R ABI 1.32 L ABI 1.24 R TBI .92 L TBI 1.05 Electronic Signature(s) Signed: 03/17/2023 8:55:48 AM By: Midge Aver MSN RN CNS WTA Entered By: Midge Aver on 03/17/2023 08:13:20 -------------------------------------------------------------------------------- Clinic Level of Care Assessment Details Patient Name: Date of Service: KEIGEN, PLAUT. 03/17/2023 7:45 A M Medical Record Number: [id number] Patient Account Number: [id number] Date of Birth/Sex: Treating RN: Mar 27, 1956 (67 y.o. [name] Primary Care Bernhard Koskinen: Saralyn Pilar Other Clinician: Referring Deitrick Ferreri: Treating Johanna Matto/Extender: Tiburcio Pea in Treatment: 17 Clinic Level of Care Assessment Items TOOL 1 Quantity Score []  - 0 Use when EandM and  Procedure is performed on INITIAL visit ASSESSMENTS - Nursing Assessment / Reassessment []  - 0 General Physical Exam (combine w/ comprehensive assessment (listed just below) when performed on new pt. evals) []  - 0 Comprehensive Assessment (HX, ROS, Risk Assessments, Wounds Hx, etc.) ASSESSMENTS - Wound and Skin Assessment / Reassessment []  - 0 Dermatologic / Skin Assessment (not related to wound area) Depoy, [name] ([id number]) [id number] Page 2 of 9 ASSESSMENTS - Ostomy and/or Continence Assessment and Care []  - 0 Incontinence Assessment and Management []  - 0 Ostomy Care Assessment and Management (repouching, etc.) PROCESS - Coordination of Care []  - 0 Simple Patient / Family Education for ongoing care []  - 0 Complex (extensive) Patient / Family Education for ongoing care []  - 0 Staff obtains Chiropractor, Records, T Results / Process Orders est []  - 0 Staff telephones HHA, Nursing Homes / Clarify orders / etc []  - 0 Routine Transfer to another Facility (non-emergent condition) []  - 0 Routine Hospital Admission (non-emergent condition) []  - 0 New Admissions / Manufacturing engineer / Ordering NPWT Apligraf, etc. , []  - 0 Emergency Hospital Admission (emergent condition) PROCESS - Special Needs []  - 0 Pediatric / Minor Patient Management []  - 0 Isolation Patient Management []  - 0 Hearing / Language / Visual special needs []  - 0 Assessment of Community assistance (transportation, D/C planning, etc.) []  - 0 Additional assistance / Altered mentation []  - 0 Support Surface(s) Assessment (bed, cushion, seat, etc.) INTERVENTIONS - Miscellaneous []  - 0 External ear exam []  - 0 Patient Transfer (multiple staff / Nurse, adult / Similar devices) []  - 0 Simple Staple / Suture removal (25 or less) []  - 0 Complex Staple / Suture removal (26 or more) []  - 0 Hypo/Hyperglycemic Management (do not check if billed separately) []  - 0 Ankle / Brachial  Index (ABI) - do not check if billed separately Has the patient been seen at the hospital within the last three  years: Yes Total Score: 0 Level Of Care: ____ Electronic Signature(s) Signed: 03/17/2023 8:55:48 AM By: Midge Aver MSN RN CNS WTA Entered By: Midge Aver on 03/17/2023 08:19:52 -------------------------------------------------------------------------------- Compression Therapy Details Patient Name: Date of Service: [name], Wayland M. 03/17/2023 7:45 A M Medical Record Number: [id number] Patient Account Number: [id number] Date of Birth/Sex: Treating RN: [birth year] (67 y.o. [name] Primary Care Meriem Lemieux: Saralyn Pilar Other Clinician: Referring Elease Swarm: Treating Javontay Vandam/Extender: Tiburcio Pea in Treatment: 17 Compression Therapy Performed for Wound Assessment: Wound #1 Right,Lateral Lower Leg Performed By: Clinician Midge Aver, RN Compression Type: Four Posey Boyer, [name] ([id number]) [id number] Page 3 of 9 Post Procedure Diagnosis Same as Pre-procedure Electronic Signature(s) Signed: 03/17/2023 8:55:48 AM By: Midge Aver MSN RN CNS WTA Entered By: Midge Aver on 03/17/2023 08:17:46 -------------------------------------------------------------------------------- Encounter Discharge Information Details Patient Name: Date of Service: [name], Jermond M. 03/17/2023 7:45 A M Medical Record Number: [id number] Patient Account Number: [id number] Date of Birth/Sex: Treating RN: 28-Mar-1956 (67 y.o. [name] Primary Care Leesha Veno: Saralyn Pilar Other Clinician: Referring Mckynzi Cammon: Treating Xyler Terpening/Extender: Tiburcio Pea in Treatment: 17 Encounter Discharge Information Items Post Procedure Vitals Discharge Condition: Stable Temperature (F): 97.8 Ambulatory Status: Ambulatory Pulse (bpm): 61 Discharge Destination: Home Respiratory Rate (breaths/min):  18 Transportation: Private Auto Blood Pressure (mmHg): 135/87 Accompanied By: wife Schedule Follow-up Appointment: Yes Clinical Summary of Care: Electronic Signature(s) Signed: 03/17/2023 8:55:48 AM By: Midge Aver MSN RN CNS WTA Entered By: Midge Aver on 03/17/2023 08:22:43 -------------------------------------------------------------------------------- Lower Extremity Assessment Details Patient Name: Date of Service: [name], Deangelo M. 03/17/2023 7:45 A M Medical Record Number: [id number] Patient Account Number: [id number] Date of Birth/Sex: Treating RN: 09/17/1955 (67 y.o. [name] Primary Care Beniah Magnan: Saralyn Pilar Other Clinician: Referring Merina Behrendt: Treating Virgilene Stryker/Extender: Tiburcio Pea in Treatment: 17 Edema Assessment Assessed: Kyra Searles: No] Franne Forts: No] [Left: Edema] Franne Forts: :] Pearletha Forge, [name] ([id number]) [id number] Page 4 of 9 Left: Right: Point of Measurement: 32 cm From Medial Instep 34.5 cm Ankle Left: Right: Point of Measurement: 11 cm From Medial Instep 22 cm Vascular Assessment Pulses: Dorsalis Pedis Palpable: [Right:Yes] Extremity colors, hair growth, and conditions: Extremity Color: [Right:Hyperpigmented] Hair Growth on Extremity: [Right:Yes] Temperature of Extremity: [Right:Warm] Capillary Refill: [Right:< 3 seconds] Dependent Rubor: [Right:No] Blanched when Elevated: [Right:No No] Toe Nail Assessment Left: Right: Thick: No Discolored: No Deformed: No Improper Length and Hygiene: No Electronic Signature(s) Signed: 03/17/2023 8:55:48 AM By: Midge Aver MSN RN CNS WTA Entered By: Midge Aver on 03/17/2023 08:13:38 -------------------------------------------------------------------------------- Multi Wound Chart Details Patient Name: Date of Service: [name], Kali M. 03/17/2023 7:45 A M Medical Record Number: [id number] Patient Account Number: [id number] Date of  Birth/Sex: Treating RN: 1955-09-27 (66 y.o. [name] Primary Care Allyah Heather: Saralyn Pilar Other Clinician: Referring Ethelbert Thain: Treating Lamark Schue/Extender: Tiburcio Pea in Treatment: 17 Vital Signs Height(in): 67 Pulse(bpm): 61 Weight(lbs): 174 Blood Pressure(mmHg): 135/87 Body Mass Index(BMI): 27.2 Temperature(F): 97.8 Respiratory Rate(breaths/min): 18 [1:Photos:] [N/A:N/A] RIGO, LARKE ([id number]) [1:Right, Lateral Lower Leg Wound Location: Gradually Appeared Wounding Event: Atypical Primary Etiology: Asthma, Hypertension Comorbid History: 08/29/2022 Date Acquired: 17 Weeks of Treatment: Open Wound Status: No Wound Recurrence: Yes Clustered Wound:  8x3.8x0.2 Measurements L x W x D (cm) 23.876 A (cm) : rea 4.775 Volume (cm) : 74.90% % Reduction in A rea: 87.40% % Reduction in Volume: Full Thickness Without Exposed Classification: Support Structures Medium Exudate A mount: Serosanguineous Exudate  Type: red, brown Exudate Color: None Present (0%) Granulation Amount: Large (67-100%) Necrotic Amount: Eschar Necrotic Tissue: Fat Layer (Subcutaneous Tissue): Yes N/A Exposed Structures: Fascia: No Tendon: No Muscle: No Joint: No Bone: No None  Epithelialization:] [N/A:N/A N/A N/A N/A N/A N/A N/A N/A N/A N/A N/A N/A N/A N/A N/A N/A N/A N/A N/A N/A N/A N/A] Treatment Notes Electronic Signature(s) Signed: 03/17/2023 8:55:48 AM By: Midge Aver MSN RN CNS WTA Entered By: Midge Aver on 03/17/2023 08:13:43 -------------------------------------------------------------------------------- Multi-Disciplinary Care Plan Details Patient Name: Date of Service: [name], Jamarco M. 03/17/2023 7:45 A M Medical Record Number: [id number] Patient Account Number: [id number] Date of Birth/Sex: Treating RN: [birth year] (67 y.o. [name] Primary Care Daryle Amis: Saralyn Pilar Other Clinician: Referring Ahlia Lemanski: Treating Kaspian Muccio/Extender: Tiburcio Pea in Treatment: 17 Active Inactive Necrotic Tissue Nursing Diagnoses: Knowledge deficit related to management of necrotic/devitalized tissue Goals: Patient/caregiver will verbalize understanding of reason and process for debridement of necrotic tissue Date Initiated: 11/18/2022 Target Resolution Date: 03/20/2023 Goal Status: Active Interventions: Assess patient pain level pre-, during and post procedure and prior to discharge NotesTYREASE, VERBLE ([id number]) [phone].pdf Page 6 of 9 Wound/Skin Impairment Nursing Diagnoses: Knowledge deficit related to ulceration/compromised skin integrity Goals: Patient/caregiver will verbalize understanding of skin care regimen Date Initiated: 11/18/2022 Date Inactivated: 12/30/2022 Target Resolution Date: 12/19/2022 Goal Status: Met Ulcer/skin breakdown will have a volume reduction of 30% by week 4 Date Initiated: 11/18/2022 Date Inactivated: 12/30/2022 Target Resolution Date: 12/19/2022 Goal Status: Met Ulcer/skin breakdown will have a volume reduction of 50% by week 8 Date Initiated: 11/18/2022 Date Inactivated: 01/13/2023 Target Resolution Date: 01/18/2023 Goal Status: Met Ulcer/skin breakdown will have a volume reduction of 80% by week 12 Date Initiated: 11/18/2022 Date Inactivated: 02/17/2023 Target Resolution Date: 02/18/2023 Goal Status: Met Ulcer/skin breakdown will heal within 14 weeks Date Initiated: 11/18/2022 Target Resolution Date: 03/20/2023 Goal Status: Active Interventions: Assess patient/caregiver ability to obtain necessary supplies Assess patient/caregiver ability to perform ulcer/skin care regimen upon admission and as needed Assess ulceration(s) every visit Notes: Electronic Signature(s) Signed: 03/17/2023 8:55:48 AM By: Midge Aver MSN RN CNS WTA Entered By: Midge Aver on 03/17/2023  08:20:37 -------------------------------------------------------------------------------- Pain Assessment Details Patient Name: Date of Service: [name], Aitan M. 03/17/2023 7:45 A M Medical Record Number: [id number] Patient Account Number: [id number] Date of Birth/Sex: Treating RN: [birth year] (66 y.o. [name] Primary Care Sione Baumgarten: Saralyn Pilar Other Clinician: Referring Django Nguyen: Treating Gwendolyn Mclees/Extender: Tiburcio Pea in Treatment: 17 Active Problems Location of Pain Severity and Description of Pain Patient Has Paino No Site Locations [place] ([id number]) [id number] Page 7 of 9 Pain Management and Medication Current Pain Management: Electronic Signature(s) Signed: 03/17/2023 8:55:48 AM By: Midge Aver MSN RN CNS WTA Entered By: Midge Aver on 03/17/2023 08:13:28 -------------------------------------------------------------------------------- Patient/Caregiver Education Details Patient Name: Date of Service: [name] 12/18/2024andnbsp7:45 A M Medical Record Number: [id number] Patient Account Number: [id number] Date of Birth/Gender: Treating RN: 11/15/1955 (67 y.o. [name] Primary Care Physician: Saralyn Pilar Other Clinician: Referring Physician: Treating Physician/Extender: Tiburcio Pea in Treatment: 17 Education Assessment Education Provided To: Patient Education Topics Provided Wound Debridement: Handouts: Wound Debridement Methods: Explain/Verbal Responses: State content correctly Wound/Skin Impairment: Handouts: Caring for Your Ulcer Methods: Explain/Verbal Responses: State content correctly Electronic Signature(s) Signed: 03/17/2023 8:55:48 AM By: Midge Aver MSN RN CNS WTA Entered By: Midge Aver on 03/17/2023 08:21:04 [place], [name] ([id number]) [id number] Page 8 of  9 --------------------------------------------------------------------------------  Wound Assessment Details Patient Name: Date of Service: MACLAREN, ARROYAVE. 03/17/2023 7:45 A M Medical Record Number: [id number] Patient Account Number: [id number] Date of Birth/Sex: Treating RN: 27-Feb-1956 (67 y.o. [name] Primary Care [place]: Saralyn Pilar Other Clinician: Referring Deserie Dirks: Treating Tayvia Faughnan/Extender: Tiburcio Pea in Treatment: 17 Wound Status Wound Number: 1 Primary Etiology: Atypical Wound Location: Right, Lateral Lower Leg Wound Status: Open Wounding Event: Gradually Appeared Comorbid History: Asthma, Hypertension Date Acquired: 08/29/2022 Weeks Of Treatment: 17 Clustered Wound: Yes Photos Wound Measurements Length: (cm) 8 Width: (cm) 3.8 Depth: (cm) 0.2 Area: (cm) 23.876 Volume: (cm) 4.775 % Reduction in Area: 74.9% % Reduction in Volume: 87.4% Epithelialization: None Wound Description Classification: Full Thickness Without Exposed Suppor Exudate Amount: Medium Exudate Type: Serosanguineous Exudate Color: red, brown t Structures Foul Odor After Cleansing: No Slough/Fibrino Yes Wound Bed Granulation Amount: None Present (0%) Exposed Structure Necrotic Amount: Large (67-100%) Fascia Exposed: No Necrotic Quality: Eschar Fat Layer (Subcutaneous Tissue) Exposed: Yes Tendon Exposed: No Muscle Exposed: No Joint Exposed: No Bone Exposed: No Treatment Notes Wound #1 (Lower Leg) Wound Laterality: Right, Lateral Cleanser Vashe 5.8 (oz) Discharge Instruction: Use vashe 5.8 (oz) as directed Peri-Wound Care [place], [name] ([id number]) [id number] Page 9 of 9 Topical Triamcinolone Acetonide Cream, 0.1%, 15 (g) tube Discharge Instruction: Apply as directed by Teja Judice. Primary Dressing Hydrofera Blue Ready Transfer Foam, 4x5 (in/in) Discharge Instruction: Apply Hydrofera Blue Ready to wound bed as  directed Secondary Dressing ABD Pad 5x9 (in/in) Discharge Instruction: Cover with ABD pad Secured With Compression Wrap Urgo K2, two layer compression system, regular Compression Stockings Add-Ons Electronic Signature(s) Signed: 03/17/2023 8:55:48 AM By: Midge Aver MSN RN CNS WTA Entered By: Midge Aver on 03/17/2023 08:03:50 -------------------------------------------------------------------------------- Vitals Details Patient Name: Date of Service: [name], Doil M. 03/17/2023 7:45 A M Medical Record Number: [id number] Patient Account Number: [id number] Date of Birth/Sex: Treating RN: 1955/09/29 (67 y.o. [name] Primary Care Britini Garcilazo: Saralyn Pilar Other Clinician: Referring Ami Thornsberry: Treating Porter Nakama/Extender: Tiburcio Pea in Treatment: 17 Vital Signs Time Taken: 07:55 Temperature (F): 97.8 Height (in): 67 Pulse (bpm): 61 Weight (lbs): 174 Respiratory Rate (breaths/min): 18 Body Mass Index (BMI): 27.2 Blood Pressure (mmHg): 135/87 Reference Range: 80 - 120 mg / dl Electronic Signature(s) Signed: 03/17/2023 8:55:48 AM By: Midge Aver MSN RN CNS WTA Entered By: Midge Aver on 03/17/2023 08:13:23

## 2023-03-20 NOTE — Progress Notes (Signed)
[name], [name] ([id number]) [id number] Page 1 of 9 Visit Report for 03/10/2023 Arrival Information Details Patient Name: Date of Service: [name], [name]. 03/10/2023 7:45 A M Medical Record Number: [id number] Patient Account Number: [id number] Date of Birth/Sex: Treating RN: 05/31/1955 (67 y.o. [name] Primary Care Langley Ingalls: Saralyn Pilar Other Clinician: Referring Loni Delbridge: Treating Willey Due/Extender: RO BSO N, [name], [name] in Treatment: 16 Visit Information History Since Last Visit Added or deleted any medications: No Patient Arrived: Ambulatory Any new allergies or adverse reactions: No Arrival Time: 07:57 Had a fall or experienced change in No Accompanied By: wife activities of daily living that may affect Transfer Assistance: None risk of falls: Patient Identification Verified: Yes Hospitalized since last visit: No Secondary Verification Process Completed: Yes Has Dressing in Place as Prescribed: Yes Patient Requires Transmission-Based Precautions: No Has Compression in Place as Prescribed: Yes Patient Has Alerts: Yes Pain Present Now: No Patient Alerts: R ABI 1.32 L ABI 1.24 R TBI .92 L TBI 1.05 Electronic Signature(s) Signed: 03/10/2023 4:46:15 PM By: Midge Aver MSN RN CNS WTA Entered By: Midge Aver on 03/10/2023 04:57:32 -------------------------------------------------------------------------------- Clinic Level of Care Assessment Details Patient Name: Date of Service: [name], [name]. 03/10/2023 7:45 A M Medical Record Number: [id number] Patient Account Number: [id number] Date of Birth/Sex: Treating RN: 1956-01-02 (67 y.o. [name] Primary Care Burnell Hurta: Saralyn Pilar Other Clinician: Referring Ayaana Biondo: Treating Sung Parodi/Extender: RO BSO Dorris Carnes, [name], [name] in Treatment: 16 Clinic Level of Care Assessment Items TOOL 1 Quantity Score []  - 0 Use when  EandM and Procedure is performed on INITIAL visit ASSESSMENTS - Nursing Assessment / Reassessment []  - 0 General Physical Exam (combine w/ comprehensive assessment (listed just below) when performed on new pt. evals) []  - 0 Comprehensive Assessment (HX, ROS, Risk Assessments, Wounds Hx, etc.) ASSESSMENTS - Wound and Skin Assessment / Reassessment []  - 0 Dermatologic / Skin Assessment (not related to wound area) Ropp, [name] ([id number]) [id number] Page 2 of 9 ASSESSMENTS - Ostomy and/or Continence Assessment and Care []  - 0 Incontinence Assessment and Management []  - 0 Ostomy Care Assessment and Management (repouching, etc.) PROCESS - Coordination of Care []  - 0 Simple Patient / Family Education for ongoing care []  - 0 Complex (extensive) Patient / Family Education for ongoing care []  - 0 Staff obtains Chiropractor, Records, T Results / Process Orders est []  - 0 Staff telephones HHA, Nursing Homes / Clarify orders / etc []  - 0 Routine Transfer to another Facility (non-emergent condition) []  - 0 Routine Hospital Admission (non-emergent condition) []  - 0 New Admissions / Manufacturing engineer / Ordering NPWT Apligraf, etc. , []  - 0 Emergency Hospital Admission (emergent condition) PROCESS - Special Needs []  - 0 Pediatric / Minor Patient Management []  - 0 Isolation Patient Management []  - 0 Hearing / Language / Visual special needs []  - 0 Assessment of Community assistance (transportation, D/C planning, etc.) []  - 0 Additional assistance / Altered mentation []  - 0 Support Surface(s) Assessment (bed, cushion, seat, etc.) INTERVENTIONS - Miscellaneous []  - 0 External ear exam []  - 0 Patient Transfer (multiple staff / Nurse, adult / Similar devices) []  - 0 Simple Staple / Suture removal (25 or less) []  - 0 Complex Staple / Suture removal (26 or more) []  - 0 Hypo/Hyperglycemic Management (do not check if billed separately) []  - 0 Ankle /  Brachial Index (ABI) - do not check if billed separately Has the patient been  seen at the hospital within the last three years: Yes Total Score: 0 Level Of Care: ____ Electronic Signature(s) Signed: 03/10/2023 4:46:15 PM By: Midge Aver MSN RN CNS WTA Entered By: Midge Aver on 03/10/2023 05:20:11 -------------------------------------------------------------------------------- Complex / Palliative Patient Assessment Details Patient Name: Date of Service: [name], [name] 03/10/2023 7:45 A M Medical Record Number: [id number] Patient Account Number: [id number] Date of Birth/Sex: Treating RN: 13-Dec-1955 (67 y.o. [name] Primary Care Yezenia Fredrick: Saralyn Pilar Other Clinician: Referring Ashely Joshua: Treating Aahil Fredin/Extender: Tiburcio Pea in Treatment: 16 Complex Wound Management Criteria Patient requires a surgical procedure in order to achieve wound healing: [name], [name] ([id number]) [phone].pdf Page 3 of 9 venous ablation However, the patient does not wish to undergo the recommended surgical procedure. Patient has remarkable or complex co-morbidities requiring medications or treatments that extend wound healing times. Examples: Diabetes mellitus with chronic renal failure or end stage renal disease requiring dialysis Advanced or poorly controlled rheumatoid arthritis Diabetes mellitus and end stage chronic obstructive pulmonary disease Active cancer with current chemo- or radiation therapy CKD III Palliative Wound Management Criteria Care Approach Wound Care Plan: Complex Wound Management Electronic Signature(s) Signed: 03/15/2023 10:38:34 PM By: Elliot Gurney, BSN, RN, CWS, Kim RN, BSN Signed: 03/19/2023 12:21:49 PM By: Allen Derry PA-C Entered By: Elliot Gurney, BSN, RN, CWS, Kim on 03/15/2023 19:38:34 -------------------------------------------------------------------------------- Compression Therapy Details Patient Name: Date  of Service: [name], [name] 03/10/2023 7:45 A M Medical Record Number: [id number] Patient Account Number: [id number] Date of Birth/Sex: Treating RN: Dec 31, 1955 (67 y.o. [name] Primary Care Kaleab Frasier: Saralyn Pilar Other Clinician: Referring Haidar Muse: Treating Hisako Bugh/Extender: RO BSO Dorris Carnes, [name], [name] in Treatment: 16 Compression Therapy Performed for Wound Assessment: Wound #1 Right,Lateral Lower Leg Performed By: Clinician Midge Aver, RN Compression Type: Four Layer Post Procedure Diagnosis Same as Pre-procedure Electronic Signature(s) Signed: 03/10/2023 4:46:15 PM By: Midge Aver MSN RN CNS WTA Entered By: Midge Aver on 03/10/2023 05:19:36 -------------------------------------------------------------------------------- Encounter Discharge Information Details Patient Name: Date of Service: [name], Chelsea M. 03/10/2023 7:45 A M Medical Record Number: [id number] Patient Account Number: [id number] Date of Birth/Sex: Treating RN: 1956-03-29 (67 y.o. [name] Primary Care Taavi Hoose: Saralyn Pilar Other Clinician: Referring Levon Boettcher: Treating Lorrie Gargan/Extender: Chauncey Mann, [name], [name] in Treatment: [address] [place] M ([id number]) [id number] Page 4 of 9 Encounter Discharge Information Items Post Procedure Vitals Discharge Condition: Stable Temperature (F): 97.6 Ambulatory Status: Ambulatory Pulse (bpm): 60 Discharge Destination: Home Respiratory Rate (breaths/min): 18 Transportation: Private Auto Blood Pressure (mmHg): 131/80 Accompanied By: wife Schedule Follow-up Appointment: Yes Clinical Summary of Care: Electronic Signature(s) Signed: 03/10/2023 4:46:15 PM By: Midge Aver MSN RN CNS WTA Entered By: Midge Aver on 03/10/2023 05:21:51 -------------------------------------------------------------------------------- Lower Extremity Assessment Details Patient Name:  Date of Service: [name], [name] 03/10/2023 7:45 A M Medical Record Number: [id number] Patient Account Number: [id number] Date of Birth/Sex: Treating RN: 1955-06-15 (67 y.o. [name] Primary Care Britne Borelli: Saralyn Pilar Other Clinician: Referring Geniya Fulgham: Treating Dalary Hollar/Extender: RO BSO N, [name], [name] in Treatment: 16 Edema Assessment Assessed: [Left: No] [Right: Yes] [Left: Edema] [Right: :] Calf Left: Right: Point of Measurement: 32 cm From Medial Instep 39 cm Ankle Left: Right: Point of Measurement: 11 cm From Medial Instep 29 cm Vascular Assessment Pulses: Dorsalis Pedis Palpable: [Right:Yes] Extremity colors, hair growth, and conditions: Extremity Color: [Right:Hyperpigmented] Toe Nail Assessment Left: Right: Thick: No Discolored: No Deformed: No  Improper Length and Hygiene: No Electronic Signature(s) Signed: 03/10/2023 4:46:15 PM By: Midge Aver MSN RN CNS WTA Entered By: Midge Aver on 03/10/2023 05:10:14 Gawthrop, [name] ([id number]) [id number] Page 5 of 9 -------------------------------------------------------------------------------- Multi Wound Chart Details Patient Name: Date of Service: [name], [name]. 03/10/2023 7:45 A M Medical Record Number: [id number] Patient Account Number: [id number] Date of Birth/Sex: Treating RN: 1955/08/22 (67 y.o. [name] Primary Care Yevonne Yokum: Saralyn Pilar Other Clinician: Referring Iretta Mangrum: Treating Dianara Smullen/Extender: RO BSO N, [name], [name] in Treatment: 16 Vital Signs Height(in): 67 Pulse(bpm): 60 Weight(lbs): 174 Blood Pressure(mmHg): 131/80 Body Mass Index(BMI): 27.2 Temperature(F): 97.6 Respiratory Rate(breaths/min): 18 [1:Photos:] [N/A:N/A] Right, Lateral Lower Leg N/A N/A Wound Location: Gradually Appeared N/A N/A Wounding Event: Atypical N/A N/A Primary Etiology: Asthma, Hypertension N/A  N/A Comorbid History: 08/29/2022 N/A N/A Date Acquired: 16 N/A N/A Weeks of Treatment: Open N/A N/A Wound Status: No N/A N/A Wound Recurrence: Yes N/A N/A Clustered Wound: 7.8x4.5x0.3 N/A N/A Measurements L x W x D (cm) 27.567 N/A N/A A (cm) : rea 8.27 N/A N/A Volume (cm) : 71.00% N/A N/A % Reduction in A rea: 78.20% N/A N/A % Reduction in Volume: Full Thickness Without Exposed N/A N/A Classification: Support Structures Medium N/A N/A Exudate A mount: Serosanguineous N/A N/A Exudate Type: red, brown N/A N/A Exudate Color: None Present (0%) N/A N/A Granulation Amount: Large (67-100%) N/A N/A Necrotic Amount: Eschar N/A N/A Necrotic Tissue: Fat Layer (Subcutaneous Tissue): Yes N/A N/A Exposed Structures: Fascia: No Tendon: No Muscle: No Joint: No Bone: No None N/A N/A Epithelialization: Treatment Notes Electronic Signature(s) Signed: 03/10/2023 4:46:15 PM By: Midge Aver MSN RN CNS WTA Entered By: Midge Aver on 03/10/2023 05:14:00 Chicas, [name] ([id number]) [id number] Page 6 of 9 -------------------------------------------------------------------------------- Multi-Disciplinary Care Plan Details Patient Name: Date of Service: [name], [name]. 03/10/2023 7:45 A M Medical Record Number: [id number] Patient Account Number: [id number] Date of Birth/Sex: Treating RN: 08/22/1955 (67 y.o. [name] Primary Care Merrill Deanda: Saralyn Pilar Other Clinician: Referring Lisanne Ponce: Treating Jontavia Leatherbury/Extender: RO BSO Dorris Carnes, [name], [name] in Treatment: 16 Active Inactive Necrotic Tissue Nursing Diagnoses: Knowledge deficit related to management of necrotic/devitalized tissue Goals: Patient/caregiver will verbalize understanding of reason and process for debridement of necrotic tissue Date Initiated: 11/18/2022 Target Resolution Date: 03/20/2023 Goal Status: Active Interventions: Assess patient pain level  pre-, during and post procedure and prior to discharge Notes: Wound/Skin Impairment Nursing Diagnoses: Knowledge deficit related to ulceration/compromised skin integrity Goals: Patient/caregiver will verbalize understanding of skin care regimen Date Initiated: 11/18/2022 Date Inactivated: 12/30/2022 Target Resolution Date: 12/19/2022 Goal Status: Met Ulcer/skin breakdown will have a volume reduction of 30% by week 4 Date Initiated: 11/18/2022 Date Inactivated: 12/30/2022 Target Resolution Date: 12/19/2022 Goal Status: Met Ulcer/skin breakdown will have a volume reduction of 50% by week 8 Date Initiated: 11/18/2022 Date Inactivated: 01/13/2023 Target Resolution Date: 01/18/2023 Goal Status: Met Ulcer/skin breakdown will have a volume reduction of 80% by week 12 Date Initiated: 11/18/2022 Date Inactivated: 02/17/2023 Target Resolution Date: 02/18/2023 Goal Status: Met Ulcer/skin breakdown will heal within 14 weeks Date Initiated: 11/18/2022 Target Resolution Date: 03/20/2023 Goal Status: Active Interventions: Assess patient/caregiver ability to obtain necessary supplies Assess patient/caregiver ability to perform ulcer/skin care regimen upon admission and as needed Assess ulceration(s) every visit Notes: Electronic Signature(s) Signed: 03/10/2023 4:46:15 PM By: Midge Aver MSN RN CNS WTA Entered By: Midge Aver on 03/10/2023 05:20:36 Barton, [name] ([id number]) [id number] Page 7 of 9 --------------------------------------------------------------------------------  Pain Assessment Details Patient Name: Date of Service: [name], [name]. 03/10/2023 7:45 A M Medical Record Number: [id number] Patient Account Number: [id number] Date of Birth/Sex: Treating RN: 06-08-1955 (67 y.o. [name] Primary Care Carlo Guevarra: Saralyn Pilar Other Clinician: Referring Deno Sida: Treating Trisha Morandi/Extender: RO BSO N, [name], [name] in  Treatment: 16 Active Problems Location of Pain Severity and Description of Pain Patient Has Paino No Site Locations Pain Management and Medication Current Pain Management: Electronic Signature(s) Signed: 03/10/2023 4:46:15 PM By: Midge Aver MSN RN CNS WTA Entered By: Midge Aver on 03/10/2023 04:57:56 -------------------------------------------------------------------------------- Patient/Caregiver Education Details Patient Name: Date of Service: [name] 12/11/2024andnbsp7:45 A M Medical Record Number: [id number] Patient Account Number: [id number] Date of Birth/Gender: Treating RN: 27-Jul-1955 (67 y.o. [name] Primary Care Physician: Saralyn Pilar Other Clinician: Referring Physician: Treating Physician/Extender: Chauncey Mann, [name], [name] in Treatment: [address], [place] M ([id number]) [id number] Page 8 of 9 Education Assessment Education Provided To: Patient Education Topics Provided Wound Debridement: Handouts: Wound Debridement Methods: Explain/Verbal Responses: State content correctly Wound/Skin Impairment: Handouts: Caring for Your Ulcer Methods: Explain/Verbal Responses: State content correctly Electronic Signature(s) Signed: 03/10/2023 4:46:15 PM By: Midge Aver MSN RN CNS WTA Entered By: Midge Aver on 03/10/2023 05:20:52 -------------------------------------------------------------------------------- Wound Assessment Details Patient Name: Date of Service: [name], [name] 03/10/2023 7:45 A M Medical Record Number: [id number] Patient Account Number: [id number] Date of Birth/Sex: Treating RN: 1956/03/23 (67 y.o. [name] Primary Care Josalin Carneiro: Saralyn Pilar Other Clinician: Referring Rechy Bost: Treating Aryanne Gilleland/Extender: RO BSO N, [name], [name] in Treatment: 16 Wound Status Wound Number: 1 Primary Etiology: Atypical Wound Location: Right,  Lateral Lower Leg Wound Status: Open Wounding Event: Gradually Appeared Comorbid History: Asthma, Hypertension Date Acquired: 08/29/2022 Weeks Of Treatment: 16 Clustered Wound: Yes Photos Wound Measurements Length: (cm) 7.8 Width: (cm) 4.5 Depth: (cm) 0.3 Area: (cm) 27.567 Volume: (cm) 8.27 Ellwood, Ember M ([id number]) Wound Description Classification: Full Thickness Without Exposed Suppor Exudate Amount: Medium Exudate Type: Serosanguineous Exudate Color: red, brown t Structures Foul Odor After Cleansing: Slough/Fibrino % Reduction in Area: 71% % Reduction in Volume: 78.2% Epithelialization: None [id number] Page 9 of 9 No Yes Wound Bed Granulation Amount: None Present (0%) Exposed Structure Necrotic Amount: Large (67-100%) Fascia Exposed: No Necrotic Quality: Eschar Fat Layer (Subcutaneous Tissue) Exposed: Yes Tendon Exposed: No Muscle Exposed: No Joint Exposed: No Bone Exposed: No Electronic Signature(s) Signed: 03/10/2023 4:46:15 PM By: Midge Aver MSN RN CNS WTA Entered By: Midge Aver on 03/10/2023 05:08:26 -------------------------------------------------------------------------------- Vitals Details Patient Name: Date of Service: [name], Jaysun M. 03/10/2023 7:45 A M Medical Record Number: [id number] Patient Account Number: [id number] Date of Birth/Sex: Treating RN: 13-Jun-1955 (67 y.o. [name] Primary Care Mana Haberl: Saralyn Pilar Other Clinician: Referring Raymonde Hamblin: Treating Markanthony Gedney/Extender: RO BSO N, [name], [name] in Treatment: 16 Vital Signs Time Taken: 07:57 Temperature (F): 97.6 Height (in): 67 Pulse (bpm): 60 Weight (lbs): 174 Respiratory Rate (breaths/min): 18 Body Mass Index (BMI): 27.2 Blood Pressure (mmHg): 131/80 Reference Range: 80 - 120 mg / dl Electronic Signature(s) Signed: 03/10/2023 4:46:15 PM By: Midge Aver MSN RN CNS WTA Entered By: Midge Aver on 03/10/2023  04:57:50

## 2023-03-20 NOTE — Progress Notes (Signed)
[name], [name] ([id number]) [id number] Page 1 of 8 Visit Report for 03/17/2023 Chief Complaint Document Details Patient Name: Date of Service: [name], [name]. 03/17/2023 7:45 A M Medical Record Number: [id number] Patient Account Number: [id number] Date of Birth/Sex: Treating RN: [birth year] (67 y.o. [name] Primary Care Provider: Saralyn Pilar Other Clinician: Referring Provider: Treating Provider/Extender: Tiburcio Pea in Treatment: 17 Information Obtained from: Patient Chief Complaint Right lower extremity ulcer Electronic Signature(s) Signed: 03/17/2023 8:36:01 AM By: Allen Derry PA-C Entered By: Allen Derry on 03/17/2023 05:36:01 -------------------------------------------------------------------------------- Debridement Details Patient Name: Date of Service: [name], [name] 03/17/2023 7:45 A M Medical Record Number: [id number] Patient Account Number: [id number] Date of Birth/Sex: Treating RN: [birth year] (67 y.o. [name] Primary Care Provider: Saralyn Pilar Other Clinician: Referring Provider: Treating Provider/Extender: Tiburcio Pea in Treatment: 17 Debridement Performed for Assessment: Wound #1 Right,Lateral Lower Leg Performed By: Physician Allen Derry, PA-C The following information was scribed by: Midge Aver The information was scribed for: Allen Derry Debridement Type: Debridement Level of Consciousness (Pre-procedure): Awake and Alert Pre-procedure Verification/Time Out Yes - 08:15 Taken: Start Time: 08:15 Pain Control: Lidocaine 4% T opical Solution Percent of Wound Bed Debrided: 40% T Area Debrided (cm): otal 9.55 Tissue and other material debrided: Viable, Non-Viable, Slough, Subcutaneous, Slough Level: Skin/Subcutaneous Tissue Debridement Description: Excisional Instrument: Curette Bleeding: Minimum Hemostasis Achieved: Pressure Muha,  [name] ([id number]) [id number] Page 2 of 8 Procedural Pain: 0 Post Procedural Pain: 0 Response to Treatment: Procedure was tolerated well Level of Consciousness (Post- Awake and Alert procedure): Post Debridement Measurements of Total Wound Length: (cm) 8 Width: (cm) 3.8 Depth: (cm) 0.2 Volume: (cm) 4.775 Character of Wound/Ulcer Post Debridement: Stable Post Procedure Diagnosis Same as Pre-procedure Electronic Signature(s) Signed: 03/17/2023 8:55:48 AM By: Midge Aver MSN RN CNS WTA Signed: 03/19/2023 12:16:54 PM By: Allen Derry PA-C Entered By: Midge Aver on 03/17/2023 05:17:05 -------------------------------------------------------------------------------- HPI Details Patient Name: Date of Service: [name], [name] 03/17/2023 7:45 A M Medical Record Number: [id number] Patient Account Number: [id number] Date of Birth/Sex: Treating RN: [birth year] (67 y.o. [name] Primary Care Provider: Saralyn Pilar Other Clinician: Referring Provider: Treating Provider/Extender: Tiburcio Pea in Treatment: 17 History of Present Illness HPI Description: 11/18/2022 Mr. Pantelis Castrillo is a 67 year old male with a past medical history of nerve conduction deafness that presents to the clinic for a 7 month history of nonhealing ulcers to the right lower extremity. He states these happened spontaneously and have progressively gotten worse. The more proximal wound has been present for 7 months and the most recent wound on the top of the foot/distal leg has been present for the past month. He has 5 wounds total. He visited the ED on 11/04/2022 for this issue. He was given cephalexin for which he completed. He has been using Several ointments at different times to address this issue. These ointments include Silvadene, mupirocin and clobetasol.. At times he keeps the wounds open to air. He also states he will go in the pool with the  wounds exposed. For work he is on his feet for long periods of time. He does not wear compression stockings. ABIs in office were 0.8. He currently denies signs of infection. 8/28; patient presents for follow-up. He has been using Vashe wet-to-dry dressings to the wound beds. Patient's wound culture showed Pseudomonas aeruginosa sensitive to ciprofloxacin and this was sent into the pharmacy. Also recommended Keystone antibiotic ointment And  this has already been ordered. He has not received this yet. He had a skin biopsy done as well that showed fibrosis inflammation. This was nonspecific. Patient declined debridement due to chronic pain. 9/4; patient presents for follow-up. He has been using Vashe wet-to-dry dressings to the wound beds. He obtains the [facility] antibiotic ointment tomorrow. He has 1 day left of his oral antibiotics. There has been improvement in size since last clinic visit to the wound beds. He currently denies systemic signs of infection. 9/11; patient presents for follow-up. He has been using Keystone antibiotic ointment to the wound beds. He has tolerated this treatment well. Wound measurements are stable however periwound appears less irritated. 9/18; patient using Keystone and the ABD pad and Tubigrip. Unfortunately the Keystone cream coalesced over the surface of the wounds requiring an aggressive debridement to remove it. Under the [facility] the surface of the wound was not viable requiring further aggressive debridement. The patient has 4 wounds on the right lateral leg which are venous in etiology and a clustered localized area 9/25; this patient has a difficult cluster of wounds on the right lateral lower leg and ankle. Very painful. We have been using Keystone, Hydrofera Blue and Tubigrip compression. His ABI is 0.8 in this clinic. As far as I can see he is not seen vein and vascular for reflux studies. A biopsy was done in our clinic that just showed scar tissue no other  identifiable pathology. 10/2; this patient has a cluster of wounds on the right lateral lower leg and ankle. He has surrounding stasis dermatitis no doubt chronic venous insufficiency with hemosiderin deposition. I have also been concerned about the difficulty feeling his posterior tibial and dorsalis pedis pulses although his ABI in the clinic was 0.8. He definitely has arterial studies on Thursday, I thought he might have venous reflux studies at the same time if not he is going to need these [name]. Likely will need to see vascular surgery as well. We have been using [address] ABDs and Tubigrip x 2 Tremaine, Dahmir M ([id number]) [id number] Page 3 of 8 10/9 the patient is gone for his vascular testing. Fortunately the arterial studies were fairly normal although his ABI was noncompressible at 1.32 on the right is TBI at 0.92 is normal. Triphasic and biphasic waveforms. On the left ABI was normal triphasic waveforms and normal TBI in terms of the venous reflux studies surprisingly benign. He had no evidence of a DVT no superficial throat thrombosis. He did have venous reflux noted in the right greater saphenous vein in the calf although this is not the drainage area where his current wounds are on the right lateral calf. There was no reflux in the small saphenous vein. We are using Sorbact and hydrogel as the primary dressing also using Keystone. 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 Wounds are better this week and he tolerated the Urgo K2 compression well set it was comfortable and had no issues with it. Wound surfaces are better and there is less surrounding erythema around this cluster of difficult wounds on the right lateral ankle and  lower leg 10/23; appointment with the vascular surgery on November 5. Question is would he benefit from a greater saphenous vein ablation with regards to helping these  wounds heal. I was hoping for a better outcome today however he has a yellowish gritty surface requiring debridement. We have been using Sorbact hydrogel. He was denied for Apligraf we will put TheraSkin through his [employer] based [facility] [name]. Not sure if this is a class the mile or not. He has not had any pain and says things feel better. He is tolerating the full 4-layer equivalent compression 10/30; appointment with vascular surgery on November 5. He comes in today with a fair amount of drainage here. He had 4 wounds in this area with significant surrounding stasis dermatitis they have coalesced now into 3 wounds. We have been using Sorbact and hydrogel. He was denied for Apligraf and we are still waiting to hear about TheraSkin although this may be a class the denile. He seemed to be doing well at the beginning of the month after putting TCA in the 4- layer compression on him however he now seems to have plateaued. 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 His wife is asking about changing this at home again when we were using Tubigrip's. His arterial studies were previously reasonably unremarkable. 02-03-2023 upon evaluation today patient appears to be doing pretty well currently in regard to his wound. He actually did see vascular yesterday and they actually discussed a venous ablation surgery for him. I do think that this will be appropriate and I think it would help him as well. Fortunately I do not see any signs of active infection locally or systemically which is good news. There is some need for some sharp debridement but in general he seems to be making good progress here. 02-10-2023 upon evaluation today patient's wound does show signs of quite a bit of drainage but I do not see obvious  signs of infection at this time I discussed that with him today. With that being said I think that the silver cell may be trapping a lot of fluid up underneath the edge of this and I think if we were to switch something out to say [facility] this may actually do better. I discussed that with him today. 02-17-2023 upon evaluation today patient appears to be doing well currently in regard to his wounds. He has been tolerating the dressing changes I did have to perform some debridement today wound actually looks significantly better I am actually very pleased with where we stand I do believe that he is making excellent headway towards closure. 02-24-2023 upon evaluation today patient appears to be doing well currently in regard to his wound which is showing some signs of improvement which is excellent news. Fortunately I do not see any signs of active infection looking or systemically which is great news as well. No fevers, chills, nausea, vomiting, or diarrhea. We have gotten approval for TheraSkin but there was some issues with the provider in network status. Specifically with integrated wound specialist. Apparently were in network in a couple locations but I got Velna Hatchet to check it but not with the one that we bill under. Nonetheless I think that this is something that can be fixed and needs to be fixed ASAP. 03-10-2023 upon evaluation today patient's wound is actually showing signs of excellent improvement and looks to be doing much better. Fortunately I do not see any signs of active infection at this time which is great news that he has a little bit of irritation around the edges of the wound. I am going to recommend that  we go ahead and work on diet in particular as far as cream is concerned I recommend topical mixup triamcinolone and nystatin to see if we help with some of the itching and irritation there. Otherwise the wound itself appears to be doing really well. 03-17-2023 upon evaluation  today patient appears to be doing well currently in regard to his wounds. He is actually tolerating the dressing changes without complication. There is gena be some need here for sharp debridement and in general I do believe that he is making good headway towards closure. Electronic Signature(s) Signed: 03/17/2023 8:36:16 AM By: Allen Derry PA-C Entered By: Allen Derry on 03/17/2023 05:36:16 -------------------------------------------------------------------------------- Physical Exam Details Patient Name: Date of Service: [name], [name] 03/17/2023 7:45 A M Medical Record Number: [id number] Patient Account Number: [id number] Date of Birth/Sex: Treating RN: 14-Jul-1955 (67 y.o. [name] Primary Care Provider: Saralyn Pilar Other Clinician: Referring Provider: Treating Provider/Extender: Tiburcio Pea in Treatment: 17 Constitutional Well-nourished and well-hydrated in no acute distress. Respiratory normal breathing without difficulty. VASH, PREVOT ([id number]) [id number] Page 4 of 8 Psychiatric this patient is able to make decisions and demonstrates good insight into disease process. Alert and Oriented x 3. pleasant and cooperative. Notes Upon inspection patient's wound bed actually showed signs of good granulation and epithelization at this point. Fortunately I do not see any signs of infection he does have some need for sharp debridement here and I did actually perform debridement clearway necrotic debris he tolerated this today without complication the wound looks much better following. Electronic Signature(s) Signed: 03/17/2023 8:36:32 AM By: Allen Derry PA-C Entered By: Allen Derry on 03/17/2023 05:36:32 -------------------------------------------------------------------------------- Physician Orders Details Patient Name: Date of Service: [name], [name] 03/17/2023 7:45 A M Medical Record Number:  [id number] Patient Account Number: [id number] Date of Birth/Sex: Treating RN: October 27, 1955 (67 y.o. [name] Primary Care Provider: Saralyn Pilar Other Clinician: Referring Provider: Treating Provider/Extender: Tiburcio Pea in Treatment: 17 The following information was scribed by: Midge Aver The information was scribed for: Allen Derry Verbal / Phone Orders: No Diagnosis Coding Follow-up Appointments Return Appointment in 1 week. Nurse Visit as needed Bathing/ Shower/ Hygiene May shower with wound dressing protected with water repellent cover or cast protector. No tub bath. Anesthetic (Use 'Patient Medications' Section for Anesthetic Order Entry) Lidocaine applied to wound bed Wound Treatment Wound #1 - Lower Leg Wound Laterality: Right, Lateral Cleanser: Vashe 5.8 (oz) Every Other Day/30 Days Discharge Instructions: Use vashe 5.8 (oz) as directed Topical: Triamcinolone Acetonide Cream, 0.1%, 15 (g) tube Every Other Day/30 Days Discharge Instructions: Apply as directed by provider. Prim Dressing: Hydrofera Blue Ready Transfer Foam, 4x5 (in/in) Every Other Day/30 Days ary Discharge Instructions: Apply Hydrofera Blue Ready to wound bed as directed Secondary Dressing: ABD Pad 5x9 (in/in) (Generic) Every Other Day/30 Days Discharge Instructions: Cover with ABD pad Compression Wrap: Urgo K2, two layer compression system, regular Every Other Day/30 Days Electronic Signature(s) Signed: 03/17/2023 8:55:48 AM By: Midge Aver MSN RN CNS WTA Signed: 03/19/2023 12:16:54 PM By: Allen Derry PA-C Entered By: Midge Aver on 03/17/2023 05:19:41 Midgley, [name] ([id number]) [id number] Page 5 of 8 -------------------------------------------------------------------------------- Problem List Details Patient Name: Date of Service: [name], [name]. 03/17/2023 7:45 A M Medical Record Number: [id number] Patient Account Number:  [id number] Date of Birth/Sex: Treating RN: 07-06-1955 (67 y.o. 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 03/17/2023 7:45 A M Medical Record Number: [id number] Patient Account Number: [id number] Date of Birth/Sex: Treating RN: 1956-02-05 (67 y.o. [name] Primary Care Provider: Saralyn Pilar Other Clinician: Referring Provider: Treating Provider/Extender: Tiburcio Pea in Treatment: [address] Subjective Chief Complaint [name], [name] ([id number]) [id number] Page 6 of 8 Information obtained from Patient Right lower extremity ulcer History of Present Illness (HPI) 11/18/2022 Mr. Takota Jacquez is a 67 year old male with a past medical history of nerve conduction deafness that presents to the clinic for a 7 month history of nonhealing ulcers to the right lower extremity. He states these happened spontaneously and have progressively gotten worse. The more proximal wound has been present for 7 months and the most recent wound on the top of the foot/distal leg has been present for the past month. He has 5 wounds  total. He visited the ED on 11/04/2022 for this issue. He was given cephalexin for which he completed. He has been using Several ointments at different times to address this issue. These ointments include Silvadene, mupirocin and clobetasol.. At times he keeps the wounds open to air. He also states he will go in the pool with the wounds exposed. For work he is on his feet for long periods of time. He does not wear compression stockings. ABIs in office were 0.8. He currently denies signs of infection. 8/28; patient presents for follow-up. He has been using Vashe wet-to-dry dressings to the wound beds. Patient's wound culture showed Pseudomonas aeruginosa sensitive to ciprofloxacin and this was sent into the pharmacy. Also recommended Keystone antibiotic ointment And this has already been ordered. He has not received this yet. He had a skin biopsy done as well that showed fibrosis inflammation. This was nonspecific. Patient declined debridement due to chronic pain. 9/4; patient presents for follow-up. He has been using Vashe wet-to-dry dressings to the wound beds. He obtains the [facility] antibiotic ointment tomorrow. He has 1 day left of his oral antibiotics. There has been improvement in size since last clinic visit to the wound beds. He currently denies systemic signs of infection. 9/11; patient presents for follow-up. He has been using Keystone antibiotic ointment to the wound beds. He has tolerated this treatment well. Wound measurements are stable however periwound appears less irritated. 9/18; patient using Keystone and the ABD pad and Tubigrip. Unfortunately the Keystone cream coalesced over the surface of the wounds requiring an aggressive debridement to remove it. Under the [facility] the surface of the wound was not viable requiring further aggressive debridement. The patient has 4 wounds on the right lateral leg which are venous in etiology and a clustered localized area 9/25; this patient has  a difficult cluster of wounds on the right lateral lower leg and ankle. Very painful. We have been using Keystone, Hydrofera Blue and Tubigrip compression. His ABI is 0.8 in this clinic. As far as I can see he is not seen vein and vascular for reflux studies. A biopsy  was done in our clinic that just showed scar tissue no other identifiable pathology. 10/2; this patient has a cluster of wounds on the right lateral lower leg and ankle. He has surrounding stasis dermatitis no doubt chronic venous insufficiency with hemosiderin deposition. I have also been concerned about the difficulty feeling his posterior tibial and dorsalis pedis pulses although his ABI in the clinic was 0.8. He definitely has arterial studies on Thursday, I thought he might have venous reflux studies at the same time if not he is going to need these [name]. Likely will need to see vascular surgery as well. We have been using [facility] ABDs and Tubigrip x 2 10/9 the patient is gone for his vascular testing. Fortunately the arterial studies were fairly normal although his ABI was noncompressible at 1.32 on the right is TBI at 0.92 is normal. Triphasic and biphasic waveforms. On the left ABI was normal triphasic waveforms and normal TBI in terms of the venous reflux studies surprisingly benign. He had no evidence of a DVT no superficial throat thrombosis. He did have venous reflux noted in the right greater saphenous vein in the calf although this is not the drainage area where his current wounds are on the right lateral calf. There was no reflux in the small saphenous vein. We are using Sorbact and hydrogel as the primary dressing also using Keystone. 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 Wounds are better  this week and he tolerated the Urgo K2 compression well set it was comfortable and had no issues with it. Wound surfaces are better and there is less surrounding erythema around this cluster of difficult wounds on the right lateral ankle and lower leg 10/23; appointment with the vascular surgery on November 5. Question is would he benefit from a greater saphenous vein ablation with regards to helping these wounds heal. I was hoping for a better outcome today however he has a yellowish gritty surface requiring debridement. We have been using Sorbact hydrogel. He was denied for Apligraf we will put TheraSkin through his [employer] based [facility] [name]. Not sure if this is a class the mile or not. He has not had any pain and says things feel better. He is tolerating the full 4-layer equivalent compression 10/30; appointment with vascular surgery on November 5. He comes in today with a fair amount of drainage here. He had 4 wounds in this area with significant surrounding stasis dermatitis they have coalesced now into 3 wounds. We have been using Sorbact and hydrogel. He was denied for Apligraf and we are still waiting to hear about TheraSkin although this may be a class the denile. He seemed to be doing well at the beginning of the month after putting TCA in the 4- layer compression on him however he now seems to have plateaued. 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 His wife is asking about changing this at home again when we were using Tubigrip's. His arterial studies were previously reasonably unremarkable. 02-03-2023 upon evaluation today patient appears to be doing pretty well currently in regard to his wound. He actually did see vascular yesterday and they actually discussed a venous ablation surgery for him. I do think that this will be appropriate  and I think it would help him as well. Fortunately I do not see any signs of active infection locally or systemically  which is good news. There is some need for some sharp debridement but in general he seems to be making good progress here. 02-10-2023 upon evaluation today patient's wound does show signs of quite a bit of drainage but I do not see obvious signs of infection at this time I discussed that with him today. With that being said I think that the silver cell may be trapping a lot of fluid up underneath the edge of this and I think if we were to switch something out to say [facility] this may actually do better. I discussed that with him today. 02-17-2023 upon evaluation today patient appears to be doing well currently in regard to his wounds. He has been tolerating the dressing changes I did have to perform some debridement today wound actually looks significantly better I am actually very pleased with where we stand I do believe that he is making excellent headway towards closure. 02-24-2023 upon evaluation today patient appears to be doing well currently in regard to his wound which is showing some signs of improvement which is excellent news. Fortunately I do not see any signs of active infection looking or systemically which is great news as well. No fevers, chills, nausea, vomiting, or diarrhea. We have gotten approval for [facility] but there was some issues with the provider in network status. Specifically with integrated wound specialist. Apparently were in network in a couple locations but I got Velna Hatchet to check it but not with the one that we bill under. Nonetheless I think that this is something that can be fixed and needs to be fixed ASAP. 03-10-2023 upon evaluation today patient's wound is actually showing signs of excellent improvement and looks to be doing much better. Fortunately I do not see any signs of active infection at this time which is great news that he has a little bit of irritation around the edges of the wound. I am going to recommend that we go ahead and work on diet in  particular as far as cream is concerned I recommend topical mixup triamcinolone and nystatin to see if we help with some of the itching and irritation there. Otherwise the wound itself appears to be doing really well. 03-17-2023 upon evaluation today patient appears to be doing well currently in regard to his wounds. He is actually tolerating the dressing changes without complication. There is gena be some need here for sharp debridement and in general I do believe that he is making good headway towards closure. [name], [name] ([id number]) [id number] Page 7 of 8 Objective Constitutional Well-nourished and well-hydrated in no acute distress. Vitals Time Taken: 7:55 AM, Height: 67 in, Weight: 174 lbs, BMI: 27.2, Temperature: 97.8 F, Pulse: 61 bpm, Respiratory Rate: 18 breaths/min, Blood Pressure: 135/87 mmHg. Respiratory normal breathing without difficulty. Psychiatric this patient is able to make decisions and demonstrates good insight into disease process. Alert and Oriented x 3. pleasant and cooperative. General Notes: Upon inspection patient's wound bed actually showed signs of good granulation and epithelization at this point. Fortunately I do not see any signs of infection he does have some need for sharp debridement here and I did actually perform debridement clearway necrotic debris he tolerated this today without complication the wound looks much better following. Integumentary (Hair, Skin) Wound #1 status is Open. Original cause of wound was Gradually Appeared. The date acquired was:  08/29/2022. The wound has been in treatment 17 weeks. The wound is located on the Right,Lateral Lower Leg. The wound measures 8cm length x 3.8cm width x 0.2cm depth; 23.876cm^2 area and 4.775cm^3 volume. There is Fat Layer (Subcutaneous Tissue) exposed. There is a medium amount of serosanguineous drainage noted. There is no granulation within the wound bed. There is a large (67-100%)  amount of necrotic tissue within the wound bed including Eschar. 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 There was a Excisional Skin/Subcutaneous Tissue Debridement with a total area of 9.55 sq cm performed by Allen Derry, PA-C. With the following instrument(s): Curette to remove Viable and Non-Viable tissue/material. Material removed includes Subcutaneous Tissue and Slough and after achieving pain control using Lidocaine 4% T opical Solution. No specimens were taken. A time out was conducted at 08:15, prior to the start of the procedure. A Minimum amount of bleeding was controlled with Pressure. The procedure was tolerated well with a pain level of 0 throughout and a pain level of 0 following the procedure. Post Debridement Measurements: 8cm length x 3.8cm width x 0.2cm depth; 4.775cm^3 volume. Character of Wound/Ulcer Post Debridement is stable. Post procedure Diagnosis Wound #1: Same as Pre-Procedure Pre-procedure diagnosis of Wound #1 is an Atypical located on the Right,Lateral Lower Leg . There was a Four Layer Compression Therapy Procedure by Midge Aver, RN. Post procedure Diagnosis Wound #1: Same as Pre-Procedure Plan Follow-up Appointments: Return Appointment in 1 week. Nurse Visit as needed Bathing/ Shower/ Hygiene: May shower with wound dressing protected with water repellent cover or cast protector. No tub bath. Anesthetic (Use 'Patient Medications' Section for Anesthetic Order Entry): Lidocaine applied to wound bed WOUND #1: - Lower Leg Wound Laterality: Right, Lateral Cleanser: Vashe 5.8 (oz) Every Other Day/30 Days Discharge Instructions: Use  vashe 5.8 (oz) as directed Topical: Triamcinolone Acetonide Cream, 0.1%, 15 (g) tube Every Other Day/30 Days Discharge Instructions: Apply as directed by provider. Prim Dressing: Hydrofera Blue Ready Transfer Foam, 4x5 (in/in) Every Other Day/30 Days ary Discharge Instructions: Apply Hydrofera Blue Ready to wound bed as directed [name], [name] ([id number]) [id number] Page 8 of 8 Secondary Dressing: ABD Pad 5x9 (in/in) (Generic) Every Other Day/30 Days Discharge Instructions: Cover with ABD pad Compression Wrap: Urgo K2, two layer compression system, regular Every Other Day/30 Days 1. And I would recommend based on what we are seeing that we have the patient going to continue to monitor for any signs of infection or worsening. I do believe based on what we are seeing that we are making excellent progress here. 2. Also can recommend that we continue with the [facility] specifically. 3. I would also recommend an ABD pad followed by the Urgo K2 compression wrap which I think is doing quite well. We will see patient back for reevaluation in 1 week here in the clinic. If anything worsens or changes patient will contact our office for additional recommendations. Electronic Signature(s) Signed: 03/17/2023 8:37:16 AM By: Allen Derry PA-C Entered By: Allen Derry on 03/17/2023 05:37:16 -------------------------------------------------------------------------------- SuperBill Details Patient Name: Date of Service: [name], Makani M. 03/17/2023 Medical Record Number: [id number] Patient Account Number: [id number] Date of Birth/Sex: Treating RN: July 03, 1955 (67 y.o. [name] Primary Care Provider: Althea Charon,  Alexander Other Clinician: Referring Provider: Treating Provider/Extender: Tiburcio Pea in Treatment: 17 Diagnosis Coding ICD-10 Codes Code Description I87.311 Chronic venous hypertension (idiopathic) with ulcer of right lower  extremity L97.818 Non-pressure chronic ulcer of other part of right lower leg with other specified severity 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, unspecified Facility Procedures : CPT4 Code: 16109604 Description: 11042 - DEB SUBQ TISSUE 20 SQ CM/< ICD-10 Diagnosis Description L97.818 Non-pressure chronic ulcer of other part of right lower leg with other specified Modifier: severity Quantity: 1 Physician Procedures : CPT4 Code Description Modifier 11042 11042 - WC PHYS SUBQ TISS 20 SQ CM ICD-10 Diagnosis Description L97.818 Non-pressure chronic ulcer of other part of right lower leg with other specified severity Quantity: 1 Electronic Signature(s) Signed: 03/17/2023 8:37:32 AM By: Allen Derry PA-C Entered By: Allen Derry on 03/17/2023 05:37:32

## 2023-03-23 ENCOUNTER — Encounter: Payer: BC Managed Care – PPO | Admitting: Physician Assistant

## 2023-03-23 DIAGNOSIS — I87311 Chronic venous hypertension (idiopathic) with ulcer of right lower extremity: Secondary | ICD-10-CM | POA: Diagnosis not present

## 2023-03-23 DIAGNOSIS — H908 Mixed conductive and sensorineural hearing loss, unspecified: Secondary | ICD-10-CM | POA: Diagnosis not present

## 2023-03-23 DIAGNOSIS — L97812 Non-pressure chronic ulcer of other part of right lower leg with fat layer exposed: Secondary | ICD-10-CM | POA: Diagnosis not present

## 2023-03-23 DIAGNOSIS — L97818 Non-pressure chronic ulcer of other part of right lower leg with other specified severity: Secondary | ICD-10-CM | POA: Diagnosis not present

## 2023-03-23 NOTE — Progress Notes (Addendum)
SESAR, BOSSIO ([id number]) [id number] Page 1 of 9 Visit Report for 03/23/2023 Arrival Information Details Patient Name: Date of Service: [name], [name]. 03/23/2023 7:45 A M Medical Record Number: [id number] Patient Account Number: [id number] Date of Birth/Sex: Treating RN: May 19, 1955 (67 y.o. [name] Primary Care Esmee Fallaw: Saralyn Pilar Other Clinician: Referring Nyella Eckels: Treating Cheyanna Strick/Extender: Tiburcio Pea in Treatment: 17 Visit Information History Since Last Visit Added or deleted any medications: No Patient Arrived: Ambulatory Any new allergies or adverse reactions: No Arrival Time: 07:43 Had a fall or experienced change in No Accompanied By: wife activities of daily living that may affect Transfer Assistance: None risk of falls: Patient Identification Verified: Yes Hospitalized since last visit: No Secondary Verification Process Completed: Yes Has Dressing in Place as Prescribed: Yes Patient Requires Transmission-Based Precautions: No Has Compression in Place as Prescribed: Yes Patient Has Alerts: Yes Pain Present Now: No Patient Alerts: R ABI 1.32 L ABI 1.24 R TBI .92 L TBI 1.05 Electronic Signature(s) Signed: 03/23/2023 11:55:55 AM By: Midge Aver MSN RN CNS WTA Entered By: Midge Aver on 03/23/2023 04:49:52 -------------------------------------------------------------------------------- Clinic Level of Care Assessment Details Patient Name: Date of Service: [name], [name] 03/23/2023 7:45 A M Medical Record Number: [id number] Patient Account Number: [id number] Date of Birth/Sex: Treating RN: 1956/03/24 (67 y.o. [name] Primary Care Zarrah Loveland: Saralyn Pilar Other Clinician: Referring Nydia Ytuarte: Treating Tiarna Koppen/Extender: Tiburcio Pea in Treatment: 17 Clinic Level of Care Assessment Items TOOL 1 Quantity Score []  - 0 Use when EandM and  Procedure is performed on INITIAL visit ASSESSMENTS - Nursing Assessment / Reassessment []  - 0 General Physical Exam (combine w/ comprehensive assessment (listed just below) when performed on new pt. evals) []  - 0 Comprehensive Assessment (HX, ROS, Risk Assessments, Wounds Hx, etc.) ASSESSMENTS - Wound and Skin Assessment / Reassessment []  - 0 Dermatologic / Skin Assessment (not related to wound area) Dragoo, [name] ([id number]) [id number] Page 2 of 9 ASSESSMENTS - Ostomy and/or Continence Assessment and Care []  - 0 Incontinence Assessment and Management []  - 0 Ostomy Care Assessment and Management (repouching, etc.) PROCESS - Coordination of Care []  - 0 Simple Patient / Family Education for ongoing care []  - 0 Complex (extensive) Patient / Family Education for ongoing care []  - 0 Staff obtains Chiropractor, Records, T Results / Process Orders est []  - 0 Staff telephones HHA, Nursing Homes / Clarify orders / etc []  - 0 Routine Transfer to another Facility (non-emergent condition) []  - 0 Routine Hospital Admission (non-emergent condition) []  - 0 New Admissions / Manufacturing engineer / Ordering NPWT Apligraf, etc. , []  - 0 Emergency Hospital Admission (emergent condition) PROCESS - Special Needs []  - 0 Pediatric / Minor Patient Management []  - 0 Isolation Patient Management []  - 0 Hearing / Language / Visual special needs []  - 0 Assessment of Community assistance (transportation, D/C planning, etc.) []  - 0 Additional assistance / Altered mentation []  - 0 Support Surface(s) Assessment (bed, cushion, seat, etc.) INTERVENTIONS - Miscellaneous []  - 0 External ear exam []  - 0 Patient Transfer (multiple staff / Nurse, adult / Similar devices) []  - 0 Simple Staple / Suture removal (25 or less) []  - 0 Complex Staple / Suture removal (26 or more) []  - 0 Hypo/Hyperglycemic Management (do not check if billed separately) []  - 0 Ankle / Brachial  Index (ABI) - do not check if billed separately Has the patient been seen at the hospital within the last three  years: Yes Total Score: 0 Level Of Care: ____ Electronic Signature(s) Signed: 03/23/2023 11:55:55 AM By: Midge Aver MSN RN CNS WTA Entered By: Midge Aver on 03/23/2023 05:12:06 -------------------------------------------------------------------------------- Compression Therapy Details Patient Name: Date of Service: [name], [name] 03/23/2023 7:45 A M Medical Record Number: [id number] Patient Account Number: [id number] Date of Birth/Sex: Treating RN: [birth year] (67 y.o. [name] Primary Care Mikail Goostree: Saralyn Pilar Other Clinician: Referring Naturi Alarid: Treating Winta Barcelo/Extender: Tiburcio Pea in Treatment: 17 Compression Therapy Performed for Wound Assessment: Wound #1 Right,Lateral Lower Leg Performed By: Clinician Midge Aver, RN Compression Type: Four Regenia Skeeter ([id number]) [id number] Page 3 of 9 Post Procedure Diagnosis Same as Pre-procedure Electronic Signature(s) Signed: 03/23/2023 11:55:55 AM By: Midge Aver MSN RN CNS WTA Entered By: Midge Aver on 03/23/2023 05:11:15 -------------------------------------------------------------------------------- Encounter Discharge Information Details Patient Name: Date of Service: [name], [name] 03/23/2023 7:45 A M Medical Record Number: [id number] Patient Account Number: [id number] Date of Birth/Sex: Treating RN: 1955/07/06 (67 y.o. [name] Primary Care Kiara Keep: Saralyn Pilar Other Clinician: Referring Aarion Metzgar: Treating Dajia Gunnels/Extender: Tiburcio Pea in Treatment: 17 Encounter Discharge Information Items Post Procedure Vitals Discharge Condition: Stable Temperature (F): 97.8 Ambulatory Status: Ambulatory Pulse (bpm): 67 Discharge Destination: Home Respiratory Rate (breaths/min):  18 Transportation: Private Auto Blood Pressure (mmHg): 119/81 Accompanied By: wife Schedule Follow-up Appointment: Yes Clinical Summary of Care: Electronic Signature(s) Signed: 03/23/2023 11:55:55 AM By: Midge Aver MSN RN CNS WTA Entered By: Midge Aver on 03/23/2023 05:14:06 -------------------------------------------------------------------------------- Lower Extremity Assessment Details Patient Name: Date of Service: [name], Shepherd M. 03/23/2023 7:45 A M Medical Record Number: [id number] Patient Account Number: [id number] Date of Birth/Sex: Treating RN: 1956/01/27 (67 y.o. [name] Primary Care Rickesha Veracruz: Saralyn Pilar Other Clinician: Referring Annalisia Ingber: Treating Kross Swallows/Extender: Tiburcio Pea in Treatment: 17 Edema Assessment Assessed: Kyra Searles: No] Franne Forts: Yes] Edema: [Left: N] [Right: o] Calf SCOT, ENSOR ([id number]) [id number] Page 4 of 9 Left: Right: Point of Measurement: 32 cm From Medial Instep 34 cm Ankle Left: Right: Point of Measurement: 11 cm From Medial Instep 22 cm Vascular Assessment Pulses: Dorsalis Pedis Palpable: [Right:Yes] Extremity colors, hair growth, and conditions: Extremity Color: [Right:Normal] Hair Growth on Extremity: [Right:No] Temperature of Extremity: [Right:Warm] Capillary Refill: [Right:< 3 seconds] Dependent Rubor: [Right:No] Blanched when Elevated: [Right:No No] Toe Nail Assessment Left: Right: Thick: No Discolored: No Deformed: No Improper Length and Hygiene: No Electronic Signature(s) Signed: 03/23/2023 11:55:55 AM By: Midge Aver MSN RN CNS WTA Entered By: Midge Aver on 03/23/2023 05:08:52 -------------------------------------------------------------------------------- Multi Wound Chart Details Patient Name: Date of Service: [name], Tajay M. 03/23/2023 7:45 A M Medical Record Number: [id number] Patient Account Number: [id number] Date of Birth/Sex:  Treating RN: [birth year] (67 y.o. [name] Primary Care Kimanh Templeman: Saralyn Pilar Other Clinician: Referring Bingham Millette: Treating Dammon Makarewicz/Extender: Tiburcio Pea in Treatment: 17 Vital Signs Height(in): 67 Pulse(bpm): 67 Weight(lbs): 174 Blood Pressure(mmHg): 119/81 Body Mass Index(BMI): 27.2 Temperature(F): 97.8 Respiratory Rate(breaths/min): 18 [1:Photos:] [N/A:N/A] Stang, Victory M ([id number]) [1:Right, Lateral Lower Leg Wound Location: Gradually Appeared Wounding Event: Atypical Primary Etiology: Asthma, Hypertension Comorbid History: 08/29/2022 Date Acquired: 17 Weeks of Treatment: Open Wound Status: No Wound Recurrence: Yes Clustered Wound:  7.8x3.8x0.1 Measurements L x W x D (cm) 23.279 A (cm) : rea 2.328 Volume (cm) : 75.50% % Reduction in A rea: 93.90% % Reduction in Volume: Full Thickness Without Exposed Classification: Support Structures Medium Exudate A mount: Serosanguineous Exudate  Type: red, brown Exudate Color: None Present (0%) Granulation Amount: Large (67-100%) Necrotic Amount: Eschar Necrotic Tissue: Fat Layer (Subcutaneous Tissue): Yes N/A Exposed Structures: Fascia: No Tendon: No Muscle: No Joint: No Bone: No None  Epithelialization:] [N/A:N/A N/A N/A N/A N/A N/A N/A N/A N/A N/A N/A N/A N/A N/A N/A N/A N/A N/A N/A N/A N/A N/A] Treatment Notes Electronic Signature(s) Signed: 03/23/2023 11:55:55 AM By: Midge Aver MSN RN CNS WTA Entered By: Midge Aver on 03/23/2023 05:09:39 -------------------------------------------------------------------------------- Multi-Disciplinary Care Plan Details Patient Name: Date of Service: [name], Treyon M. 03/23/2023 7:45 A M Medical Record Number: [id number] Patient Account Number: [id number] Date of Birth/Sex: Treating RN: June 03, 1955 (67 y.o. [name] Primary Care Lorean Ekstrand: Saralyn Pilar Other Clinician: Referring Acsa Estey: Treating Quasean Frye/Extender: Tiburcio Pea in Treatment: 17 Active Inactive Wound/Skin Impairment Nursing Diagnoses: Knowledge deficit related to ulceration/compromised skin integrity Goals: Patient/caregiver will verbalize understanding of skin care regimen Date Initiated: 11/18/2022 Date Inactivated: 12/30/2022 Target Resolution Date: 12/19/2022 Goal Status: Met Ulcer/skin breakdown will have a volume reduction of 30% by week 4 Date Initiated: 11/18/2022 Date Inactivated: 12/30/2022 Target Resolution Date: 12/19/2022 Goal Status: Met Ulcer/skin breakdown will have a volume reduction of 50% by week 8 Date Initiated: 11/18/2022 Date Inactivated: 01/13/2023 Target Resolution Date: 01/18/2023 Goal Status: Met JAYDIEL, HOLTHAUS ([id number]) [id number] Page 6 of 9 Ulcer/skin breakdown will have a volume reduction of 80% by week 12 Date Initiated: 11/18/2022 Date Inactivated: 02/17/2023 Target Resolution Date: 02/18/2023 Goal Status: Met Ulcer/skin breakdown will heal within 14 weeks Date Initiated: 11/18/2022 Target Resolution Date: 04/20/2023 Goal Status: Active Interventions: Assess patient/caregiver ability to obtain necessary supplies Assess patient/caregiver ability to perform ulcer/skin care regimen upon admission and as needed Assess ulceration(s) every visit Notes: Electronic Signature(s) Signed: 03/23/2023 11:55:55 AM By: Midge Aver MSN RN CNS WTA Entered By: Midge Aver on 03/23/2023 05:12:48 -------------------------------------------------------------------------------- Pain Assessment Details Patient Name: Date of Service: [name], Bran M. 03/23/2023 7:45 A M Medical Record Number: [id number] Patient Account Number: [id number] Date of Birth/Sex: Treating RN: 1955-06-09 (67 y.o. [name] Primary Care Dollene Mallery: Saralyn Pilar Other Clinician: Referring Bryne Lindon: Treating Giavonna Pflum/Extender: Tiburcio Pea in Treatment: 17 Active  Problems Location of Pain Severity and Description of Pain Patient Has Paino No Site Locations Pain Management and Medication Current Pain Management: Electronic Signature(s) Signed: 03/23/2023 11:55:55 AM By: Midge Aver MSN RN CNS WTA Entered By: Midge Aver on 03/23/2023 04:51:32 Doughtie, [name] ([id number]) [id number] Page 7 of 9 -------------------------------------------------------------------------------- Patient/Caregiver Education Details Patient Name: Date of Service: KAIYON, BIRKE. 12/24/2024andnbsp7:45 A M Medical Record Number: [id number] Patient Account Number: [id number] Date of Birth/Gender: Treating RN: 1955-12-11 (67 y.o. [name] Primary Care Physician: Saralyn Pilar Other Clinician: Referring Physician: Treating Physician/Extender: Tiburcio Pea in Treatment: 17 Education Assessment Education Provided To: Patient Education Topics Provided Wound Debridement: Handouts: Wound Debridement Methods: Explain/Verbal Responses: State content correctly Wound/Skin Impairment: Handouts: Caring for Your Ulcer Methods: Explain/Verbal Responses: State content correctly Electronic Signature(s) Signed: 03/23/2023 11:55:55 AM By: Midge Aver MSN RN CNS WTA Entered By: Midge Aver on 03/23/2023 05:13:05 -------------------------------------------------------------------------------- Wound Assessment Details Patient Name: Date of Service: [name], Kethan M. 03/23/2023 7:45 A M Medical Record Number: [id number] Patient Account Number: [id number] Date of Birth/Sex: Treating RN: 12-Aug-1955 (67 y.o. [name] Primary Care Ademola Vert: Saralyn Pilar Other Clinician: Referring Krue Peterka: Treating Kennley Schwandt/Extender: Tiburcio Pea in Treatment: 17 Wound Status Wound Number: 1  Primary Etiology: Atypical Wound Location: Right, Lateral Lower Leg Wound Status:  Open Wounding Event: Gradually Appeared Comorbid History: Asthma, Hypertension Date Acquired: 08/29/2022 Weeks Of Treatment: 17 Clustered Wound: Yes Bauza, [name] ([id number]) [id number] Page 8 of 9 Photos Wound Measurements Length: (cm) 7.8 Width: (cm) 3.8 Depth: (cm) 0.1 Area: (cm) 23.279 Volume: (cm) 2.328 % Reduction in Area: 75.5% % Reduction in Volume: 93.9% Epithelialization: None Wound Description Classification: Full Thickness Without Exposed Suppor Exudate Amount: Medium Exudate Type: Serosanguineous Exudate Color: red, brown t Structures Foul Odor After Cleansing: No Slough/Fibrino Yes Wound Bed Granulation Amount: None Present (0%) Exposed Structure Necrotic Amount: Large (67-100%) Fascia Exposed: No Necrotic Quality: Eschar Fat Layer (Subcutaneous Tissue) Exposed: Yes Tendon Exposed: No Muscle Exposed: No Joint Exposed: No Bone Exposed: No Treatment Notes Wound #1 (Lower Leg) Wound Laterality: Right, Lateral Cleanser Vashe 5.8 (oz) Discharge Instruction: Use vashe 5.8 (oz) as directed Peri-Wound Care Topical Triamcinolone Acetonide Cream, 0.1%, 15 (g) tube Discharge Instruction: Apply as directed by Raelynn Corron. Primary Dressing Hydrofera Blue Ready Transfer Foam, 4x5 (in/in) Discharge Instruction: Apply Hydrofera Blue Ready to wound bed as directed Secondary Dressing ABD Pad 5x9 (in/in) Discharge Instruction: Cover with ABD pad Secured With Compression Wrap Urgo K2, two layer compression system, regular Compression Stockings Add-Ons Electronic Signature(s) Signed: 03/23/2023 11:55:55 AM By: Midge Aver MSN RN CNS WTA Entered By: Midge Aver on 03/23/2023 05:01:15 Schauer, [name] ([id number]) [id number] Page 9 of 9 -------------------------------------------------------------------------------- Vitals Details Patient Name: Date of Service: JACY, KRUKOWSKI. 03/23/2023 7:45 A M Medical Record  Number: [id number] Patient Account Number: [id number] Date of Birth/Sex: Treating RN: [birth year] (67 y.o. [name] Primary Care Bralyn Espino: Saralyn Pilar Other Clinician: Referring Xayvier Vallez: Treating Christie Viscomi/Extender: Tiburcio Pea in Treatment: 17 Vital Signs Time Taken: 07:49 Temperature (F): 97.8 Height (in): 67 Pulse (bpm): 67 Weight (lbs): 174 Respiratory Rate (breaths/min): 18 Body Mass Index (BMI): 27.2 Blood Pressure (mmHg): 119/81 Reference Range: 80 - 120 mg / dl Electronic Signature(s) Signed: 03/23/2023 11:55:55 AM By: Midge Aver MSN RN CNS WTA Entered By: Midge Aver on 03/23/2023 04:50:49

## 2023-03-23 NOTE — Progress Notes (Addendum)
QAIS, WESEMANN ([id number]) [id number] Page 1 of 9 Visit Report for 03/23/2023 Chief Complaint Document Details Patient Name: Date of Service: [name], [name]. 03/23/2023 7:45 A M Medical Record Number: [id number] Patient Account Number: [id number] Date of Birth/Sex: Treating RN: [name] 29, 1956 (67 y.o. [name] Primary Care Provider: Saralyn Pilar Other Clinician: Referring Provider: Treating Provider/Extender: Tiburcio Pea in Treatment: 17 Information Obtained from: Patient Chief Complaint Right lower extremity ulcer Electronic Signature(s) Signed: 03/23/2023 7:49:36 AM By: Allen Derry PA-C Entered By: Allen Derry on 03/23/2023 04:49:36 -------------------------------------------------------------------------------- Debridement Details Patient Name: Date of Service: [name], [name] 03/23/2023 7:45 A M Medical Record Number: [id number] Patient Account Number: [id number] Date of Birth/Sex: Treating RN: [birth year] (67 y.o. [name] Primary Care Provider: Saralyn Pilar Other Clinician: Referring Provider: Treating Provider/Extender: Tiburcio Pea in Treatment: 17 Debridement Performed for Assessment: Wound #1 Right,Lateral Lower Leg Performed By: Physician Allen Derry, PA-C The following information was scribed by: Midge Aver The information was scribed for: Allen Derry Debridement Type: Debridement Level of Consciousness (Pre-procedure): Awake and Alert Pre-procedure Verification/Time Out Yes - 08:10 Taken: Start Time: 08:10 Pain Control: Lidocaine 4% T opical Solution Percent of Wound Bed Debrided: 50% T Area Debrided (cm): otal 11.63 Tissue and other material debrided: Viable, Non-Viable, Slough, Subcutaneous, Slough Level: Skin/Subcutaneous Tissue Debridement Description: Excisional Instrument: Curette Bleeding: Minimum Hemostasis Achieved: Pressure Mclouth,  [name] ([id number]) [id number] Page 2 of 9 Procedural Pain: 0 Post Procedural Pain: 0 Response to Treatment: Procedure was tolerated well Level of Consciousness (Post- Awake and Alert procedure): Post Debridement Measurements of Total Wound Length: (cm) 7.8 Width: (cm) 3.8 Depth: (cm) 0.1 Volume: (cm) 2.328 Character of Wound/Ulcer Post Debridement: Stable Post Procedure Diagnosis Same as Pre-procedure Electronic Signature(s) Signed: 03/23/2023 11:55:55 AM By: Midge Aver MSN RN CNS WTA Signed: 03/23/2023 3:46:23 PM By: Allen Derry PA-C Entered By: Midge Aver on 03/23/2023 05:17:16 -------------------------------------------------------------------------------- HPI Details Patient Name: Date of Service: [name], [name] 03/23/2023 7:45 A M Medical Record Number: [id number] Patient Account Number: [id number] Date of Birth/Sex: Treating RN: 1955/04/13 (67 y.o. [name] Primary Care Provider: Saralyn Pilar Other Clinician: Referring Provider: Treating Provider/Extender: Tiburcio Pea in Treatment: 17 History of Present Illness HPI Description: 11/18/2022 [name] is a 67 year old male with a past medical history of nerve conduction deafness that presents to the clinic for a 7 month history of nonhealing ulcers to the right lower extremity. He states these happened spontaneously and have progressively gotten worse. The more proximal wound has been present for 7 months and the most recent wound on the top of the foot/distal leg has been present for the past month. He has 5 wounds total. He visited the ED on 11/04/2022 for this issue. He was given cephalexin for which he completed. He has been using Several ointments at different times to address this issue. These ointments include Silvadene, mupirocin and clobetasol.. At times he keeps the wounds open to air. He also states he will go in the pool with the  wounds exposed. For work he is on his feet for long periods of time. He does not wear compression stockings. ABIs in office were 0.8. He currently denies signs of infection. 8/28; patient presents for follow-up. He has been using Vashe wet-to-dry dressings to the wound beds. Patient's wound culture showed Pseudomonas aeruginosa sensitive to ciprofloxacin and this was sent into the pharmacy. Also recommended Keystone antibiotic ointment And  this has already been ordered. He has not received this yet. He had a skin biopsy done as well that showed fibrosis inflammation. This was nonspecific. Patient declined debridement due to chronic pain. 9/4; patient presents for follow-up. He has been using Vashe wet-to-dry dressings to the wound beds. He obtains the [facility] antibiotic ointment tomorrow. He has 1 day left of his oral antibiotics. There has been improvement in size since last clinic visit to the wound beds. He currently denies systemic signs of infection. 9/11; patient presents for follow-up. He has been using Keystone antibiotic ointment to the wound beds. He has tolerated this treatment well. Wound measurements are stable however periwound appears less irritated. 9/18; patient using Keystone and the ABD pad and Tubigrip. Unfortunately the Keystone cream coalesced over the surface of the wounds requiring an aggressive debridement to remove it. Under the [facility] the surface of the wound was not viable requiring further aggressive debridement. The patient has 4 wounds on the right lateral leg which are venous in etiology and a clustered localized area 9/25; this patient has a difficult cluster of wounds on the right lateral lower leg and ankle. Very painful. We have been using Keystone, Hydrofera Blue and Tubigrip compression. His ABI is 0.8 in this clinic. As far as I can see he is not seen vein and vascular for reflux studies. A biopsy was done in our clinic that just showed scar tissue no other  identifiable pathology. 10/2; this patient has a cluster of wounds on the right lateral lower leg and ankle. He has surrounding stasis dermatitis no doubt chronic venous insufficiency with hemosiderin deposition. I have also been concerned about the difficulty feeling his posterior tibial and dorsalis pedis pulses although his ABI in the clinic was 0.8. He definitely has arterial studies on Thursday, I thought he might have venous reflux studies at the same time if not he is going to need these [name]. Likely will need to see vascular surgery as well. We have been using [facility] ABDs and Tubigrip x 2 Loeffler, Justan M ([id number]) [id number] Page 3 of 9 10/9 the patient is gone for his vascular testing. Fortunately the arterial studies were fairly normal although his ABI was noncompressible at 1.32 on the right is TBI at 0.92 is normal. Triphasic and biphasic waveforms. On the left ABI was normal triphasic waveforms and normal TBI in terms of the venous reflux studies surprisingly benign. He had no evidence of a DVT no superficial throat thrombosis. He did have venous reflux noted in the right greater saphenous vein in the calf although this is not the drainage area where his current wounds are on the right lateral calf. There was no reflux in the small saphenous vein. We are using Sorbact and hydrogel as the primary dressing also using Keystone. 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 Wounds are better this week and he tolerated the Urgo K2 compression well set it was comfortable and had no issues with it. Wound surfaces are better and there is less surrounding erythema around this cluster of difficult wounds on the right lateral ankle and  lower leg 10/23; appointment with the vascular surgery on November 5. Question is would he benefit from a greater saphenous vein ablation with regards to helping these  wounds heal. I was hoping for a better outcome today however he has a yellowish gritty surface requiring debridement. We have been using Sorbact hydrogel. He was denied for Apligraf we will put TheraSkin through his [employer] based [employer]. Not sure if this is a class the mile or not. He has not had any pain and says things feel better. He is tolerating the full 4-layer equivalent compression 10/30; appointment with vascular surgery on November 5. He comes in today with a fair amount of drainage here. He had 4 wounds in this area with significant surrounding stasis dermatitis they have coalesced now into 3 wounds. We have been using Sorbact and hydrogel. He was denied for Apligraf and we are still waiting to hear about TheraSkin although this may be a class the denile. He seemed to be doing well at the beginning of the month after putting TCA in the 4- layer compression on him however he now seems to have plateaued. 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 His wife is asking about changing this at home again when we were using Tubigrip's. His arterial studies were previously reasonably unremarkable. 02-03-2023 upon evaluation today patient appears to be doing pretty well currently in regard to his wound. He actually did see vascular yesterday and they actually discussed a venous ablation surgery for him. I do think that this will be appropriate and I think it would help him as well. Fortunately I do not see any signs of active infection locally or systemically which is good news. There is some need for some sharp debridement but in general he seems to be making good progress here. 02-10-2023 upon evaluation today patient's wound does show signs of quite a bit of drainage but I do not see obvious  signs of infection at this time I discussed that with him today. With that being said I think that the silver cell may be trapping a lot of fluid up underneath the edge of this and I think if we were to switch something out to say [facility] this may actually do better. I discussed that with him today. 02-17-2023 upon evaluation today patient appears to be doing well currently in regard to his wounds. He has been tolerating the dressing changes I did have to perform some debridement today wound actually looks significantly better I am actually very pleased with where we stand I do believe that he is making excellent headway towards closure. 02-24-2023 upon evaluation today patient appears to be doing well currently in regard to his wound which is showing some signs of improvement which is excellent news. Fortunately I do not see any signs of active infection looking or systemically which is great news as well. No fevers, chills, nausea, vomiting, or diarrhea. We have gotten approval for TheraSkin but there was some issues with the provider in network status. Specifically with integrated wound specialist. Apparently were in network in a couple locations but I got Velna Hatchet to check it but not with the one that we bill under. Nonetheless I think that this is something that can be fixed and needs to be fixed ASAP. 03-10-2023 upon evaluation today patient's wound is actually showing signs of excellent improvement and looks to be doing much better. Fortunately I do not see any signs of active infection at this time which is great news that he has a little bit of irritation around the edges of the wound. I am going to recommend that  we go ahead and work on diet in particular as far as cream is concerned I recommend topical mixup triamcinolone and nystatin to see if we help with some of the itching and irritation there. Otherwise the wound itself appears to be doing really well. 03-17-2023 upon evaluation  today patient appears to be doing well currently in regard to his wounds. He is actually tolerating the dressing changes without complication. There is gena be some need here for sharp debridement and in general I do believe that he is making good headway towards closure. 03-23-2023 upon evaluation today patient appears to be doing excellent in regard to his wound. He has been tolerating the dressing changes without complication. Fortunately I do not see any signs of active infection at this time. Electronic Signature(s) Signed: 03/23/2023 8:47:17 AM By: Allen Derry PA-C Entered By: Allen Derry on 03/23/2023 05:47:17 -------------------------------------------------------------------------------- Physical Exam Details Patient Name: Date of Service: [name], [name] 03/23/2023 7:45 A M Medical Record Number: [id number] Patient Account Number: [id number] Date of Birth/Sex: Treating RN: 10-18-1955 (67 y.o. [name] Primary Care Provider: Saralyn Pilar Other Clinician: Referring Provider: Treating Provider/Extender: Tiburcio Pea in Treatment: 17 Constitutional Well-nourished and well-hydrated in no acute distress. REINER, ROBICHEAU ([id number]) [id number] Page 4 of 9 Respiratory normal breathing without difficulty. Psychiatric this patient is able to make decisions and demonstrates good insight into disease process. Alert and Oriented x 3. pleasant and cooperative. Notes Upon inspection patient's wound bed actually showed signs of good granulation epithelization at this point. Fortunately I do not see any evidence of worsening overall and I believe that the patient is doing well currently. I did perform debridement clearway necrotic debris slough and biofilm he tolerated that without complication postdebridement wound bed is significantly improved. Electronic Signature(s) Signed: 03/23/2023 8:47:36 AM By: Allen Derry  PA-C Entered By: Allen Derry on 03/23/2023 05:47:36 -------------------------------------------------------------------------------- Physician Orders Details Patient Name: Date of Service: [name], [name] 03/23/2023 7:45 A M Medical Record Number: [id number] Patient Account Number: [id number] Date of Birth/Sex: Treating RN: [birth year] (67 y.o. 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 Nurse Visit as needed Bathing/ Shower/ Hygiene May shower with wound dressing protected with water repellent cover or cast protector. No tub bath. Anesthetic (Use 'Patient Medications' Section for Anesthetic Order Entry) Lidocaine applied to wound bed Wound Treatment Wound #1 - Lower Leg Wound Laterality: Right, Lateral Cleanser: Vashe 5.8 (oz) Every Other Day/30 Days Discharge Instructions: Use vashe 5.8 (oz) as directed Topical: Triamcinolone Acetonide Cream, 0.1%, 15 (g) tube Every Other Day/30 Days Discharge Instructions: Apply as directed by provider. Prim Dressing: Hydrofera Blue Ready Transfer Foam, 4x5 (in/in) Every Other Day/30 Days ary Discharge Instructions: Apply Hydrofera Blue Ready to wound bed as directed Secondary Dressing: ABD Pad 5x9 (in/in) (Generic) Every Other Day/30 Days Discharge Instructions: Cover with ABD pad Compression Wrap: Urgo K2, two layer compression  system, regular Every Other Day/30 Days [place], [name] ([id number]) [id number] Page 5 of 9 Electronic Signature(s) Signed: 03/23/2023  11:55:55 AM By: Midge Aver MSN RN CNS WTA Signed: 03/23/2023 3:46:23 PM By: Allen Derry PA-C Entered By: Midge Aver on 03/23/2023 05:11:58 -------------------------------------------------------------------------------- Problem List Details Patient Name: Date of Service: [name], [name] 03/23/2023 7:45 A M Medical Record Number: [id number] Patient Account Number: [id number] Date of Birth/Sex: Treating RN: January 23, 1956 (67 y.o. 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 03/23/2023 7:45 A M Medical Record Number: [id number] Patient Account Number: [id number] Date of Birth/Sex: Treating RN: December 11, 1955 (67 y.o. [name] Primary Care Provider: Saralyn Pilar Other Clinician: Referring Provider: Treating Provider/Extender: Tiburcio Pea in Treatment: 17 Subjective Chief  Complaint Information obtained from Patient Right lower extremity ulcer History of Present Illness (HPI) 11/18/2022 [name] is a 67 year old male with a past medical history of nerve conduction deafness that presents to the clinic for a 7 month history of nonhealing ulcers to the right lower extremity. He states these happened spontaneously and have progressively gotten worse. The more proximal wound has been present for 7 months and the most recent wound on the top of the foot/distal leg has been present for the past month. He has 5 wounds total. He visited the ED on 11/04/2022 for this issue. He was given cephalexin for which he completed. He has been using Several ointments at different times to address this issue. These ointments include Silvadene, mupirocin and clobetasol.. At times he keeps the wounds open to air. He also states he will go in the pool with the wounds exposed. For work he is on his feet for long periods of time. He does not wear compression stockings. ABIs in office were 0.8. He currently denies signs of infection. 8/28; patient presents for follow-up. He has been using Vashe wet-to-dry dressings to the wound beds. Patient's wound culture showed Pseudomonas aeruginosa sensitive to ciprofloxacin and this was sent into the pharmacy. Also recommended Keystone antibiotic ointment And this has already been ordered. He has not received this yet. He had a skin biopsy done as well that showed fibrosis inflammation. This was nonspecific. Patient declined debridement due to chronic pain. 9/4; patient presents for follow-up. He has been using Vashe wet-to-dry dressings to the wound beds. He obtains the [facility] antibiotic ointment tomorrow. He has 1 day left of his oral antibiotics. There has been improvement in size since last clinic visit to the wound beds. He currently denies systemic signs of infection. 9/11; patient presents for follow-up. He has been using Keystone antibiotic  ointment to the wound beds. He has tolerated this treatment well. Wound measurements are stable however periwound appears less irritated. 9/18; patient using Keystone and the ABD pad and Tubigrip. Unfortunately the Keystone cream coalesced over the surface of the wounds requiring an aggressive debridement to remove it. Under the [facility] the surface of the wound was not viable requiring further  aggressive debridement. The patient has 4 wounds on the right lateral leg which are venous in etiology and a clustered localized area 9/25; this patient has a difficult cluster of wounds on the right lateral lower leg and ankle. Very painful. We have been using Keystone, Hydrofera Blue and Tubigrip compression. His ABI is 0.8 in this clinic. As far as I can see he is not seen vein and vascular for reflux studies. A biopsy was done in our clinic that just showed scar tissue no other identifiable pathology. 10/2; this patient has a cluster of wounds on the right lateral lower leg and ankle. He has surrounding stasis dermatitis no doubt chronic venous insufficiency with hemosiderin deposition. I have also been concerned about the difficulty feeling his posterior tibial and dorsalis pedis pulses although his ABI in the clinic was 0.8. He definitely has arterial studies on Thursday, I thought he might have venous reflux studies at the same time if not he is going to need these [name]. Likely will need to see vascular surgery as well. We have been using [facility] ABDs and Tubigrip x 2 10/9 the patient is gone for his vascular testing. Fortunately the arterial studies were fairly normal although his ABI was noncompressible at 1.32 on the right is TBI at 0.92 is normal. Triphasic and biphasic waveforms. On the left ABI was normal triphasic waveforms and normal TBI in terms of the venous reflux studies surprisingly benign. He had no evidence of a DVT no superficial throat thrombosis. He did have venous  reflux noted in the right greater saphenous vein in the calf although this is not the drainage area where his current wounds are on the right lateral calf. There was no reflux in the small saphenous vein. We are using Sorbact and hydrogel as the primary dressing also using Keystone. 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 Wounds are better this week and he tolerated the Urgo K2 compression well set it was comfortable and had no issues with it. Wound surfaces are better and there is less surrounding erythema around this cluster of difficult wounds on the right lateral ankle and lower leg 10/23; appointment with the vascular surgery on November 5. Question is would he benefit from a greater saphenous vein ablation with regards to helping these wounds heal. I was hoping for a better outcome today however he has a yellowish gritty surface requiring debridement. We have been using Sorbact hydrogel. He was denied for Apligraf we will put TheraSkin through his [employer] based [employer]. Not sure if this is a class the mile or not. He has not had any pain and says things feel better. He is tolerating the full 4-layer equivalent compression 10/30; appointment with vascular surgery on November 5. He comes in today with a fair amount of drainage here. He had 4 wounds in this area with significant surrounding stasis dermatitis they have coalesced now into 3 wounds. We have been using Sorbact and hydrogel. He was denied for Apligraf and we are still waiting to hear about TheraSkin although this may be a class the denile. He seemed to be doing well at the beginning of the month after putting TCA in the 4- layer compression on him however he now seems to have plateaued. 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 His wife is asking about changing this at home again when we were using Tubigrip's. His arterial studies were previously reasonably unremarkable. 02-03-2023 upon evaluation today patient appears to be doing pretty well currently in regard to his wound. He actually did see vascular yesterday and they actually discussed a venous ablation surgery for him. I do think that this will be appropriate and I think it would help him as well. Fortunately I do not see any signs of active infection locally or systemically which is good news. There is some need for some sharp debridement but in general he seems to be making good progress here. 02-10-2023 upon evaluation today patient's wound does show signs of quite a bit of drainage but I do not see obvious signs of infection at this time I discussed that with him today. With that being said I think that the silver cell may be trapping a lot of fluid up underneath the edge of this and I think if we were to switch something out to say [facility] this may actually do better. I discussed that with him today. 02-17-2023 upon evaluation today patient appears to be doing well currently in regard to his wounds. He has been tolerating the dressing changes I did have to perform some debridement today wound actually looks significantly better I am actually very pleased with where we stand I do believe that he is making excellent headway towards closure. 02-24-2023 upon evaluation today patient appears to be doing well currently in regard to his wound which is showing some signs of improvement which is excellent news. Fortunately I do not see any signs of active infection looking or systemically which is great news as well. No fevers, chills, nausea, vomiting, or diarrhea. We have gotten approval for [employer] but there was some issues with the provider in network status. Specifically with integrated wound specialist. Apparently  were in network in a couple locations but I got Velna Hatchet to check it but not with the one that we bill under. Nonetheless I think that this is something that can be fixed and needs to be fixed ASAP. [name], [name] ([id number]) [id number] Page 7 of 9 03-10-2023 upon evaluation today patient's wound is actually showing signs of excellent improvement and looks to be doing much better. Fortunately I do not see any signs of active infection at this time which is great news that he has a little bit of irritation around the edges of the wound. I am going to recommend that we go ahead and work on diet in particular as far as cream is concerned I recommend topical mixup triamcinolone and nystatin to see if we help with some of the itching and irritation there. Otherwise the wound itself appears to be doing really well. 03-17-2023 upon evaluation today patient appears to be doing well currently in regard to his wounds. He is actually tolerating the dressing changes without complication. There is gena be some need here for sharp debridement and in general I do believe that he is making good headway towards closure. 03-23-2023 upon evaluation today patient appears to be doing excellent in regard to his wound. He has been tolerating the dressing changes without complication. Fortunately I do not see any signs of active infection at this time. Objective Constitutional Well-nourished and well-hydrated in no acute distress. Vitals Time Taken: 7:49 AM, Height: 67 in, Weight: 174 lbs, BMI: 27.2, Temperature: 97.8 F, Pulse: 67  bpm, Respiratory Rate: 18 breaths/min, Blood Pressure: 119/81 mmHg. Respiratory normal breathing without difficulty. Psychiatric this patient is able to make decisions and demonstrates good insight into disease process. Alert and Oriented x 3. pleasant and cooperative. General Notes: Upon inspection patient's wound bed actually showed signs of good granulation  epithelization at this point. Fortunately I do not see any evidence of worsening overall and I believe that the patient is doing well currently. I did perform debridement clearway necrotic debris slough and biofilm he tolerated that without complication postdebridement wound bed is significantly improved. Integumentary (Hair, Skin) Wound #1 status is Open. Original cause of wound was Gradually Appeared. The date acquired was: 08/29/2022. The wound has been in treatment 17 weeks. The wound is located on the Right,Lateral Lower Leg. The wound measures 7.8cm length x 3.8cm width x 0.1cm depth; 23.279cm^2 area and 2.328cm^3 volume. There is Fat Layer (Subcutaneous Tissue) exposed. There is a medium amount of serosanguineous drainage noted. There is no granulation within the wound bed. There is a large (67-100%) amount of necrotic tissue within the wound bed including Eschar. 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 There was a Excisional Skin/Subcutaneous Tissue Debridement with a total area of 11.63 sq cm performed by Allen Derry, PA-C. With the following instrument(s): Curette to remove Viable and Non-Viable tissue/material. Material removed includes Subcutaneous Tissue and Slough and after achieving pain control using Lidocaine 4% T opical Solution. No specimens were taken. A time out was conducted at 08:10, prior to the start of the procedure. A Minimum amount of bleeding was controlled with Pressure. The procedure was tolerated well with a pain level of 0 throughout and a pain level of 0 following the procedure. Post Debridement Measurements: 7.8cm length x 3.8cm width x  0.1cm depth; 2.328cm^3 volume. Character of Wound/Ulcer Post Debridement is stable. Post procedure Diagnosis Wound #1: Same as Pre-Procedure Pre-procedure diagnosis of Wound #1 is an Atypical located on the Right,Lateral Lower Leg . There was a Four Layer Compression Therapy Procedure by Midge Aver, RN. Post procedure Diagnosis Wound #1: Same as Pre-Procedure Plan PASCUAL, BUFKIN ([id number]) [id number] Page 8 of 9 Follow-up Appointments: Return Appointment in 1 week. Nurse Visit as needed Bathing/ Shower/ Hygiene: May shower with wound dressing protected with water repellent cover or cast protector. No tub bath. Anesthetic (Use 'Patient Medications' Section for Anesthetic Order Entry): Lidocaine applied to wound bed WOUND #1: - Lower Leg Wound Laterality: Right, Lateral Cleanser: Vashe 5.8 (oz) Every Other Day/30 Days Discharge Instructions: Use vashe 5.8 (oz) as directed Topical: Triamcinolone Acetonide Cream, 0.1%, 15 (g) tube Every Other Day/30 Days Discharge Instructions: Apply as directed by provider. Prim Dressing: Hydrofera Blue Ready Transfer Foam, 4x5 (in/in) Every Other Day/30 Days ary Discharge Instructions: Apply Hydrofera Blue Ready to wound bed as directed Secondary Dressing: ABD Pad 5x9 (in/in) (Generic) Every Other Day/30 Days Discharge Instructions: Cover with ABD pad Com pression Wrap: Urgo K2, two layer compression system, regular Every Other Day/30 Days 1. I would recommend that we have the patient continue to monitor for any signs of infection or worsening. I do believe based on what we are seeing that he is doing well with regard to the [facility] I would recommend we continue  with this. 2. Wellcontinue with the triamcinolone to the leg. 3. Also would recommend that he should continue to monitor for any signs of infection or worsening if anything changes he knows contact the office and let me know. We will see patient back for  reevaluation in 1 week here in the clinic. If anything worsens or changes patient will contact our office for additional recommendations. Electronic Signature(s) Signed: 03/23/2023 8:49:20 AM By: Allen Derry PA-C Entered By: Allen Derry on 03/23/2023 05:49:20 -------------------------------------------------------------------------------- SuperBill Details Patient Name: Date of Service: [name], Jemmie M. 03/23/2023 Medical Record Number: [id number] Patient Account Number: [id number] Date of Birth/Sex: Treating RN: 01/06/1956 (67 y.o. 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 L97.818 Non-pressure chronic ulcer of other part of right lower leg with other specified severity 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, unspecified Facility Procedures : CPT4 Code: [id number] Description: 11042 - DEB SUBQ TISSUE 20 SQ CM/< ICD-10 Diagnosis Description L97.818 Non-pressure chronic ulcer of other part of right lower leg with other specified Modifier: severity Quantity: 1 Physician Procedures : CPT4 Code Description Modifier 11042 11042 - WC PHYS SUBQ TISS 20 SQ CM ICD-10 Diagnosis Description [place], [name] ([id number]) [id number] Pag [phone] Non-pressure chronic ulcer of other part of right lower leg with other  specified severity Quantity: 1 e 9 of 9 Electronic Signature(s) Signed: 03/23/2023 8:49:28 AM By: Allen Derry PA-C Entered By: Allen Derry on 03/23/2023 05:49:28

## 2023-03-26 NOTE — Progress Notes (Signed)
JAMEEL, ORTMANN ([id number]) [id number] Page 1 of 8 Visit Report for 03/10/2023 Chief Complaint Document Details Patient Name: Date of Service: YI, [name]. 03/10/2023 7:45 A M Medical Record Number: [id number] Patient Account Number: [id number] Date of Birth/Sex: Treating RN: Aug 12, 1955 (67 y.o. [name] Primary Care Provider: Saralyn Pilar Other Clinician: Referring Provider: Treating Provider/Extender: Tiburcio Pea in Treatment: 16 Information Obtained from: Patient Chief Complaint Right lower extremity ulcer Electronic Signature(s) Signed: 03/19/2023 12:23:37 PM By: Allen Derry PA-C Previous Signature: 03/10/2023 8:04:25 AM Version By: Allen Derry PA-C Entered By: Allen Derry on 03/19/2023 09:23:37 -------------------------------------------------------------------------------- Debridement Details Patient Name: Date of Service: [name], [name] 03/10/2023 7:45 A M Medical Record Number: [id number] Patient Account Number: [id number] Date of Birth/Sex: Treating RN: 1955-04-21 (67 y.o. [name] Primary Care Provider: Saralyn Pilar Other Clinician: Referring Provider: Treating Provider/Extender: Tiburcio Pea in Treatment: 16 Debridement Performed for Assessment: Wound #1 Right,Lateral Lower Leg Performed By: Physician Allen Derry, PA-C The following information was scribed by: Midge Aver The information was scribed for: Allen Derry Debridement Type: Debridement Level of Consciousness (Pre-procedure): Awake and Alert Pre-procedure Verification/Time Out Yes - 08:16 Taken: Start Time: 08:16 Percent of Wound Bed Debrided: 50% T Area Debrided (cm): otal 13.78 Tissue and other material debrided: Viable, Non-Viable, Slough, Subcutaneous, Slough Level: Skin/Subcutaneous Tissue Debridement Description: Excisional Instrument: Curette Bleeding: Minimum Hemostasis  Achieved: Pressure Procedural Pain: 0 ILYAN, CORIELL ([id number]) [id number] Page 2 of 8 Post Procedural Pain: 0 Response to Treatment: Procedure was tolerated well Level of Consciousness (Post- Awake and Alert procedure): Post Debridement Measurements of Total Wound Length: (cm) 7.8 Width: (cm) 4.5 Depth: (cm) 0.3 Volume: (cm) 8.27 Character of Wound/Ulcer Post Debridement: Stable Post Procedure Diagnosis Same as Pre-procedure Electronic Signature(s) Signed: 03/19/2023 12:24:13 PM By: Allen Derry PA-C Signed: 03/26/2023 12:17:21 PM By: Midge Aver MSN RN CNS WTA Previous Signature: 03/10/2023 8:24:56 AM Version By: Allen Derry PA-C Previous Signature: 03/10/2023 4:46:15 PM Version By: Midge Aver MSN RN CNS WTA Entered By: Allen Derry on 03/19/2023 09:24:13 -------------------------------------------------------------------------------- HPI Details Patient Name: Date of Service: [name], [name] 03/10/2023 7:45 A M Medical Record Number: [id number] Patient Account Number: [id number] Date of Birth/Sex: Treating RN: January 19, 1956 (67 y.o. [name] Primary Care Provider: Saralyn Pilar Other Clinician: Referring Provider: Treating Provider/Extender: Tiburcio Pea in Treatment: 16 History of Present Illness HPI Description: 11/18/2022 [name] is a 67 year old male with a past medical history of nerve conduction deafness that presents to the clinic for a 7 month history of nonhealing ulcers to the right lower extremity. He states these happened spontaneously and have progressively gotten worse. The more proximal wound has been present for 7 months and the most recent wound on the top of the foot/distal leg has been present for the past month. He has 5 wounds total. He visited the ED on 11/04/2022 for this issue. He was given cephalexin for which he completed. He has been using Several ointments at different times  to address this issue. These ointments include Silvadene, mupirocin and clobetasol.. At times he keeps the wounds open to air. He also states he will go in the pool with the wounds exposed. For work he is on his feet for long periods of time. He does not wear compression stockings. ABIs in office were 0.8. He currently denies signs of infection. 8/28; patient presents for follow-up. He has been using Vashe wet-to-dry dressings  to the wound beds. Patient's wound culture showed Pseudomonas aeruginosa sensitive to ciprofloxacin and this was sent into the pharmacy. Also recommended Keystone antibiotic ointment And this has already been ordered. He has not received this yet. He had a skin biopsy done as well that showed fibrosis inflammation. This was nonspecific. Patient declined debridement due to chronic pain. 9/4; patient presents for follow-up. He has been using Vashe wet-to-dry dressings to the wound beds. He obtains the [facility] antibiotic ointment tomorrow. He has 1 day left of his oral antibiotics. There has been improvement in size since last clinic visit to the wound beds. He currently denies systemic signs of infection. 9/11; patient presents for follow-up. He has been using Keystone antibiotic ointment to the wound beds. He has tolerated this treatment well. Wound measurements are stable however periwound appears less irritated. 9/18; patient using Keystone and the ABD pad and Tubigrip. Unfortunately the Keystone cream coalesced over the surface of the wounds requiring an aggressive debridement to remove it. Under the [facility] the surface of the wound was not viable requiring further aggressive debridement. The patient has 4 wounds on the right lateral leg which are venous in etiology and a clustered localized area 9/25; this patient has a difficult cluster of wounds on the right lateral lower leg and ankle. Very painful. We have been using Keystone, Hydrofera Blue and Tubigrip compression.  His ABI is 0.8 in this clinic. As far as I can see he is not seen vein and vascular for reflux studies. A biopsy was done in our clinic that just showed scar tissue no other identifiable pathology. 10/2; this patient has a cluster of wounds on the right lateral lower leg and ankle. He has surrounding stasis dermatitis no doubt chronic venous insufficiency with hemosiderin deposition. I have also been concerned about the difficulty feeling his posterior tibial and dorsalis pedis pulses although his ABI in the clinic was 0.8. He definitely has arterial studies on Thursday, I thought he might have venous reflux studies at the same time if not he is going to need these [name]. Likely will need to see vascular surgery as well. We have been using [facility] ABDs and Tubigrip x 2 Wagley, Massie M ([id number]) [id number] Page 3 of 8 10/9 the patient is gone for his vascular testing. Fortunately the arterial studies were fairly normal although his ABI was noncompressible at 1.32 on the right is TBI at 0.92 is normal. Triphasic and biphasic waveforms. On the left ABI was normal triphasic waveforms and normal TBI in terms of the venous reflux studies surprisingly benign. He had no evidence of a DVT no superficial throat thrombosis. He did have venous reflux noted in the right greater saphenous vein in the calf although this is not the drainage area where his current wounds are on the right lateral calf. There was no reflux in the small saphenous vein. We are using Sorbact and hydrogel as the primary dressing also using Keystone. 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 Wounds are better this week and he tolerated the Urgo K2 compression well set it was comfortable  and had no issues with it. Wound surfaces are better and there is less surrounding erythema around this cluster of difficult wounds on the right lateral ankle and lower  leg 10/23; appointment with the vascular surgery on November 5. Question is would he benefit from a greater saphenous vein ablation with regards to helping these wounds heal. I was hoping for a better outcome today however he has a yellowish gritty surface requiring debridement. We have been using Sorbact hydrogel. He was denied for Apligraf we will put TheraSkin through his [employer] based [employer]. Not sure if this is a class the mile or not. He has not had any pain and says things feel better. He is tolerating the full 4-layer equivalent compression 10/30; appointment with vascular surgery on November 5. He comes in today with a fair amount of drainage here. He had 4 wounds in this area with significant surrounding stasis dermatitis they have coalesced now into 3 wounds. We have been using Sorbact and hydrogel. He was denied for Apligraf and we are still waiting to hear about TheraSkin although this may be a class the denile. He seemed to be doing well at the beginning of the month after putting TCA in the 4- layer compression on him however he now seems to have plateaued. 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 His wife is asking about changing this at home again when we were using Tubigrip's. His arterial studies were previously reasonably unremarkable. 02-03-2023 upon evaluation today patient appears to be doing pretty well currently in regard to his wound. He actually did see vascular yesterday and they actually discussed a venous ablation surgery for him. I do think that this will be appropriate and I think it would help him as well. Fortunately I do not see any signs of active infection locally or systemically which is good news. There is some need for some sharp debridement but in general  he seems to be making good progress here. 02-10-2023 upon evaluation today patient's wound does show signs of quite a bit of drainage but I do not see obvious signs of infection at this time I discussed that with him today. With that being said I think that the silver cell may be trapping a lot of fluid up underneath the edge of this and I think if we were to switch something out to say [facility] this may actually do better. I discussed that with him today. 02-17-2023 upon evaluation today patient appears to be doing well currently in regard to his wounds. He has been tolerating the dressing changes I did have to perform some debridement today wound actually looks significantly better I am actually very pleased with where we stand I do believe that he is making excellent headway towards closure. 02-24-2023 upon evaluation today patient appears to be doing well currently in regard to his wound which is showing some signs of improvement which is excellent news. Fortunately I do not see any signs of active infection looking or systemically which is great news as well. No fevers, chills, nausea, vomiting, or diarrhea. We have gotten approval for TheraSkin but there was some issues with the provider in network status. Specifically with integrated wound specialist. Apparently were in network in a couple locations but I got Velna Hatchet to check it but not with the one that we bill under. Nonetheless I think that this is something that can be fixed and needs to be fixed ASAP. 03-10-2023 upon evaluation today patient's wound is actually showing signs of excellent improvement and looks to be doing much better. Fortunately I do not see any signs of active infection at  this time which is great news that he has a little bit of irritation around the edges of the wound. I am going to recommend that we go ahead and work on diet in particular as far as cream is concerned I recommend topical mixup triamcinolone and  nystatin to see if we help with some of the itching and irritation there. Otherwise the wound itself appears to be doing really well. Electronic Signature(s) Signed: 03/19/2023 12:23:44 PM By: Allen Derry PA-C Previous Signature: 03/10/2023 8:24:20 AM Version By: Allen Derry PA-C Entered By: Allen Derry on 03/19/2023 09:23:44 -------------------------------------------------------------------------------- Physical Exam Details Patient Name: Date of Service: [name], Sione M. 03/10/2023 7:45 A M Medical Record Number: [id number] Patient Account Number: [id number] Date of Birth/Sex: Treating RN: [birth year] (67 y.o. [name] Primary Care Provider: Saralyn Pilar Other Clinician: Referring Provider: Treating Provider/Extender: Tiburcio Pea in Treatment: 16 Constitutional Well-nourished and well-hydrated in no acute distress. Respiratory normal breathing without difficulty. Psychiatric MARDOCHEE, REICHLING ([id number]) [id number] Page 4 of 8 this patient is able to make decisions and demonstrates good insight into disease process. Alert and Oriented x 3. pleasant and cooperative. Notes Upon inspection patient's wound bed actually showed signs of good granulation epithelization at this point. Fortunately there does not appear to be any signs of active infection at this time which is great news and in general I do believe that we are making headway here towards closure. Electronic Signature(s) Signed: 03/19/2023 12:23:52 PM By: Allen Derry PA-C Previous Signature: 03/10/2023 8:24:37 AM Version By: Allen Derry PA-C Entered By: Allen Derry on 03/19/2023 09:23:51 -------------------------------------------------------------------------------- Physician Orders Details Patient Name: Date of Service: [name], Darean M. 03/10/2023 7:45 A M Medical Record Number: [id number] Patient Account Number: [id number] Date of Birth/Sex: Treating  RN: [birth year] (67 y.o. 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 Nurse Visit as needed Bathing/ Shower/ Hygiene May shower with wound dressing protected with water repellent cover or cast protector. No tub bath. Anesthetic (Use 'Patient Medications' Section for Anesthetic Order Entry) Lidocaine applied to wound bed Wound Treatment Wound #1 - Lower Leg Wound Laterality: Right, Lateral Cleanser: Vashe 5.8 (oz) Every Other Day/30 Days Discharge Instructions: Use vashe 5.8 (oz) as directed Prim Dressing: Hydrofera Blue Ready Transfer Foam, 4x5 (in/in) Every Other Day/30 Days ary Discharge Instructions: Apply Hydrofera Blue Ready to wound bed as directed Secondary Dressing: ABD Pad 5x9 (in/in) (Generic) Every Other Day/30 Days Discharge Instructions: Cover with ABD pad Compression Wrap: Urgo K2, two layer compression system, regular Every Other Day/30 Days Electronic Signature(s) Signed: 03/19/2023 12:24:34 PM By: Allen Derry PA-C Previous Signature: 03/10/2023 4:46:15 PM Version By: Midge Aver MSN RN CNS WTA Entered By: Allen Derry on 03/19/2023 09:24:33 Scorsone, [name] ([id number]) [id number] Page 5 of 8 -------------------------------------------------------------------------------- Problem List Details Patient Name: Date of Service: WELSEY, DIA. 03/10/2023 7:45 A M Medical Record Number: [id number] Patient Account Number: [id number] Date of Birth/Sex: Treating RN: [birth year] (67 y.o. 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 03/10/2023 7:45 A M Medical Record Number: [id number] Patient Account Number: [id number] Date of Birth/Sex: Treating RN: Feb 12, 1956 (67 y.o. [name] Primary Care Provider: Saralyn Pilar Other Clinician: Referring Provider: Treating Provider/Extender: Tiburcio Pea in Treatment: [address] [place] M ([id number]) [id number] Page 6 of 8 Subjective Chief Complaint Information obtained from Patient Right lower extremity ulcer History of Present Illness (HPI) 11/18/2022 [name] is a 67 year old male with a past medical history of nerve conduction deafness that presents to the clinic for a 7 month history of nonhealing ulcers to the right lower extremity.  He states these happened spontaneously and have progressively gotten worse. The more proximal wound has been present for 7 months and the most recent wound on the top of the foot/distal leg has been present for the past month. He has 5 wounds total. He visited the ED on 11/04/2022 for this issue. He was given cephalexin for which he completed. He has been using Several ointments at different times to address this issue. These ointments include Silvadene, mupirocin and clobetasol.. At times he keeps the wounds open to air. He also states he will go in the pool with the wounds exposed. For work he is on his feet for long periods of time. He does not wear compression stockings. ABIs in office were 0.8. He currently denies signs of infection. 8/28; patient presents for follow-up. He has been using Vashe wet-to-dry dressings to the wound beds. Patient's wound culture showed Pseudomonas aeruginosa sensitive to ciprofloxacin and this was sent into the pharmacy. Also recommended Keystone antibiotic ointment And this has already been ordered. He has not received this yet. He had a skin biopsy done as well that showed fibrosis inflammation. This was nonspecific. Patient declined debridement due to chronic pain. 9/4; patient presents for follow-up. He has been using Vashe wet-to-dry dressings to the wound beds. He obtains the [facility] antibiotic ointment tomorrow. He has 1 day left of his oral antibiotics. There has been improvement in size since last clinic visit to the wound beds. He currently denies systemic signs of infection. 9/11; patient presents for follow-up. He has been using Keystone antibiotic ointment to the wound beds. He has tolerated this treatment well. Wound measurements are stable however periwound appears less irritated. 9/18; patient using Keystone and the ABD pad and Tubigrip. Unfortunately the Keystone cream coalesced over the surface of the wounds requiring an aggressive debridement to  remove it. Under the [facility] the surface of the wound was not viable requiring further aggressive debridement. The patient has 4 wounds on the right lateral leg which are venous in etiology and a clustered localized area 9/25; this patient has a difficult cluster of wounds on the right lateral lower leg and ankle. Very painful. We  have been using Keystone, Hydrofera Blue and Tubigrip compression. His ABI is 0.8 in this clinic. As far as I can see he is not seen vein and vascular for reflux studies. A biopsy was done in our clinic that just showed scar tissue no other identifiable pathology. 10/2; this patient has a cluster of wounds on the right lateral lower leg and ankle. He has surrounding stasis dermatitis no doubt chronic venous insufficiency with hemosiderin deposition. I have also been concerned about the difficulty feeling his posterior tibial and dorsalis pedis pulses although his ABI in the clinic was 0.8. He definitely has arterial studies on Thursday, I thought he might have venous reflux studies at the same time if not he is going to need these [name]. Likely will need to see vascular surgery as well. We have been using [facility] ABDs and Tubigrip x 2 10/9 the patient is gone for his vascular testing. Fortunately the arterial studies were fairly normal although his ABI was noncompressible at 1.32 on the right is TBI at 0.92 is normal. Triphasic and biphasic waveforms. On the left ABI was normal triphasic waveforms and normal TBI in terms of the venous reflux studies surprisingly benign. He had no evidence of a DVT no superficial throat thrombosis. He did have venous reflux noted in the right greater saphenous vein in the calf although this is not the drainage area where his current wounds are on the right lateral calf. There was no reflux in the small saphenous vein. We are using Sorbact and hydrogel as the primary dressing also using Keystone. 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 Wounds are better this week and he tolerated the Urgo K2 compression well set it was comfortable and had no issues with it. Wound surfaces are better and there is less surrounding erythema around this cluster of difficult wounds on the right lateral ankle and lower leg 10/23; appointment with the vascular surgery on November 5. Question is would he benefit from a greater saphenous vein ablation with regards to helping these wounds heal. I was hoping for a better outcome today however he has a yellowish gritty surface requiring debridement. We have been using Sorbact hydrogel. He was denied for Apligraf we will put TheraSkin through his [employer] based [employer]. Not sure if this is a class the mile or not. He has not had any pain and says things feel better. He is tolerating the full 4-layer equivalent compression 10/30; appointment with vascular surgery on November 5. He comes in today with a fair amount of drainage here. He had 4 wounds in this area with significant surrounding stasis dermatitis they have coalesced now into 3 wounds. We have been using Sorbact and hydrogel. He was denied for Apligraf and we are still waiting to hear about TheraSkin although this may be a class the denile. He seemed to be doing well at the beginning of the month after putting TCA in the 4- layer compression on him however he now seems to have plateaued. 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 His wife is asking about changing this at home again when we were using Tubigrip's. His arterial studies were previously reasonably unremarkable. 02-03-2023 upon evaluation today patient appears to  be doing pretty well currently in regard to his wound. He actually did  see vascular yesterday and they actually discussed a venous ablation surgery for him. I do think that this will be appropriate and I think it would help him as well. Fortunately I do not see any signs of active infection locally or systemically which is good news. There is some need for some sharp debridement but in general he seems to be making good progress here. 02-10-2023 upon evaluation today patient's wound does show signs of quite a bit of drainage but I do not see obvious signs of infection at this time I discussed that with him today. With that being said I think that the silver cell may be trapping a lot of fluid up underneath the edge of this and I think if we were to switch something out to say [facility] this may actually do better. I discussed that with him today. 02-17-2023 upon evaluation today patient appears to be doing well currently in regard to his wounds. He has been tolerating the dressing changes I did have to perform some debridement today wound actually looks significantly better I am actually very pleased with where we stand I do believe that he is making excellent headway towards closure. 02-24-2023 upon evaluation today patient appears to be doing well currently in regard to his wound which is showing some signs of improvement which is excellent news. Fortunately I do not see any signs of active infection looking or systemically which is great news as well. No fevers, chills, nausea, vomiting, or diarrhea. We have gotten approval for [facility] but there was some issues with the provider in network status. Specifically with integrated wound specialist. Apparently were in network in a couple locations but I got Velna Hatchet to check it but not with the one that we bill under. Nonetheless I think that this is something that can be fixed and needs to be fixed ASAP. 03-10-2023 upon evaluation today patient's wound is actually showing signs of excellent improvement and looks to be  doing much better. Fortunately I do not see any signs of active infection at this time which is great news that he has a little bit of irritation around the edges of the wound. I am going to recommend that we go ahead and work on diet in particular as far as cream is concerned I recommend topical mixup triamcinolone and nystatin to see if we help with some of the itching and irritation there. Otherwise the wound itself appears to be doing really well. DONTREY, SIKORSKI ([id number]) [id number] Page 7 of 8 Objective Constitutional Well-nourished and well-hydrated in no acute distress. Vitals Time Taken: 7:57 AM, Height: 67 in, Weight: 174 lbs, BMI: 27.2, Temperature: 97.6 F, Pulse: 60 bpm, Respiratory Rate: 18 breaths/min, Blood Pressure: 131/80 mmHg. Respiratory normal breathing without difficulty. Psychiatric this patient is able to make decisions and demonstrates good insight into disease process. Alert and Oriented x 3. pleasant and cooperative. General Notes: Upon inspection patient's wound bed actually showed signs of good granulation epithelization at this point. Fortunately there does not appear to be any signs of active infection at this time which is great news and in general I do believe that we are making headway here towards closure. Integumentary (Hair, Skin) Wound #1 status is Open. Original cause of wound was Gradually Appeared. The date acquired was: 08/29/2022. The wound has been in treatment 16 weeks. The wound is located on the Right,Lateral Lower Leg. The wound measures 7.8cm  length x 4.5cm width x 0.3cm depth; 27.567cm^2 area and 8.27cm^3 volume. There is Fat Layer (Subcutaneous Tissue) exposed. There is a medium amount of serosanguineous drainage noted. There is no granulation within the wound bed. There is a large (67-100%) amount of necrotic tissue within the wound bed including Eschar. 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 There was a Excisional Skin/Subcutaneous Tissue Debridement with a total area of 13.78 sq cm performed by Allen Derry, PA-C. With the following instrument(s): Curette to remove Viable and Non-Viable tissue/material. Material removed includes Subcutaneous Tissue and Slough and. No specimens were taken. A time out was conducted at 08:16, prior to the start of the procedure. A Minimum amount of bleeding was controlled with Pressure. The procedure was tolerated well with a pain level of 0 throughout and a pain level of 0 following the procedure. Post Debridement Measurements: 7.8cm length x 4.5cm width x 0.3cm depth; 8.27cm^3 volume. Character of Wound/Ulcer Post Debridement is stable. Post procedure Diagnosis Wound #1: Same as Pre-Procedure Pre-procedure diagnosis of Wound #1 is an Atypical located on the Right,Lateral Lower Leg . There was a Four Layer Compression Therapy Procedure by Midge Aver, RN. Post procedure Diagnosis Wound #1: Same as Pre-Procedure Plan Follow-up Appointments: Return Appointment in 1 week. Nurse Visit as needed Bathing/ Shower/ Hygiene: May shower with wound dressing protected with water repellent cover or cast protector. No tub bath. Anesthetic (Use 'Patient Medications' Section for Anesthetic Order Entry): Lidocaine applied to wound bed WOUND #1: - Lower Leg Wound Laterality: Right, Lateral Cleanser: Vashe 5.8 (oz) Every Other Day/30 Days Discharge Instructions: Use vashe 5.8 (oz) as directed Prim Dressing: Hydrofera Blue Ready Transfer Foam, 4x5 (in/in) Every Other Day/30 Days ary Discharge Instructions: Apply Hydrofera Blue Ready to wound bed as  directed Secondary Dressing: ABD Pad 5x9 (in/in) (Generic) Every Other Day/30 Days Discharge Instructions: Cover with ABD pad Com pression Wrap: Urgo K2, two layer compression system, regular Every Other Day/30 Days Vanwagoner, [name] ([id number]) [id number] Page 8 of 8 1. I would recommend that we have the patient continue to monitor for any signs of infection I did perform debridement today to clearway necrotic debris he tolerated that without complication and postdebridement wound bed appears to be doing much better which is great news. 2. I am going to recommend as well that the patient should continue to elevate his legs much as possible to help with edema control the more of this that he can do the better in my opinion. 3. I am also going to suggest that we continue with the compression wrapping along with the [facility] which I think is doing extremely well for him at this point I am adding the nystatin triamcinolone mixed around the edges of the wound to help with some of the itching I think that this could be a little bit of a fungal and possible component but at the same time I think the triamcinolone will help with some of the inflammation. We will see patient back for reevaluation in 1 week here in the clinic. If anything worsens or changes patient will contact our office for additional recommendations. Electronic Signature(s) Signed: 03/19/2023 12:24:51 PM By: Allen Derry PA-C Previous Signature: 03/10/2023 8:25:31 AM Version By:  Allen Derry PA-C Entered By: Allen Derry on 03/19/2023 09:24:50 -------------------------------------------------------------------------------- SuperBill Details Patient Name: Date of Service: [name], [name] 03/10/2023 Medical Record Number: [id number] Patient Account Number: [id number] Date of Birth/Sex: Treating RN: 19-Apr-1955 (67 y.o. [name] Primary Care Provider: Saralyn Pilar Other Clinician: Referring  Provider: Treating Provider/Extender: Tiburcio Pea in Treatment: 16 Diagnosis Coding ICD-10 Codes Code Description [id number] Chronic venous hypertension (idiopathic) with ulcer of right lower extremity L97.818 Non-pressure chronic ulcer of other part of right lower leg with other specified severity 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, unspecified Facility Procedures : CPT4 Code: [id number] Description: 11042 - DEB SUBQ TISSUE 20 SQ CM/< ICD-10 Diagnosis Description L97.818 Non-pressure chronic ulcer of other part of right lower leg with other specified Modifier: severity Quantity: 1 Physician Procedures : CPT4 Code Description Modifier 11042 11042 - WC PHYS SUBQ TISS 20 SQ CM ICD-10 Diagnosis Description L97.818 Non-pressure chronic ulcer of other part of right lower leg with other specified severity Quantity: 1 Electronic Signature(s) Signed: 03/19/2023 12:25:14 PM By: Allen Derry PA-C Previous Signature: 03/19/2023 12:25:03 PM Version By: Allen Derry PA-C Previous Signature: 03/10/2023 8:25:36 AM Version By: Allen Derry PA-C Entered By: Allen Derry on 03/19/2023 09:25:14

## 2023-03-30 ENCOUNTER — Encounter: Payer: BC Managed Care – PPO | Admitting: Internal Medicine

## 2023-03-30 DIAGNOSIS — H908 Mixed conductive and sensorineural hearing loss, unspecified: Secondary | ICD-10-CM | POA: Diagnosis not present

## 2023-03-30 DIAGNOSIS — I87311 Chronic venous hypertension (idiopathic) with ulcer of right lower extremity: Secondary | ICD-10-CM | POA: Diagnosis not present

## 2023-03-30 DIAGNOSIS — L97812 Non-pressure chronic ulcer of other part of right lower leg with fat layer exposed: Secondary | ICD-10-CM | POA: Diagnosis not present

## 2023-03-30 DIAGNOSIS — L97818 Non-pressure chronic ulcer of other part of right lower leg with other specified severity: Secondary | ICD-10-CM | POA: Diagnosis not present

## 2023-03-31 NOTE — Progress Notes (Signed)
 GARION, WEMPE ([id number]) [id number] Page 1 of 10 Visit Report for 03/30/2023 Arrival Information Details Patient Name: Date of Service: [name], [name]. 03/30/2023 7:45 A M Medical Record Number: [id number] Patient Account Number: [id number] Date of Birth/Sex: Treating RN: Sep 30, 1955 (68 y.o. NETTY Claudene Blossom Primary Care Maddelyn Rocca: Edman Blunt Other Clinician: Referring Aubriee Szeto: Treating Rees Matura/Extender: RO BSO N, [name] Karamalegos, Blunt Duos in Treatment: 18 Visit Information History Since Last Visit Added or deleted any medications: No Patient Arrived: Ambulatory Any new allergies or adverse reactions: No Arrival Time: 07:46 Had a fall or experienced change in No Accompanied By: wife activities of daily living that may affect Transfer Assistance: None risk of falls: Patient Identification Verified: Yes Signs or symptoms of abuse/neglect since last visito No Secondary Verification Process Completed: Yes Hospitalized since last visit: No Patient Requires Transmission-Based Precautions: No Has Dressing in Place as Prescribed: Yes Patient Has Alerts: Yes Has Compression in Place as Prescribed: Yes Patient Alerts: R ABI 1.32 L ABI 1.24 Pain Present Now: No R TBI .92 L TBI 1.05 Electronic Signature(s) Signed: 03/30/2023 5:14:10 PM By: Claudene Blossom MSN RN CNS WTA Entered By: Claudene Blossom on 03/30/2023 07:49:20 -------------------------------------------------------------------------------- Clinic Level of Care Assessment Details Patient Name: Date of Service: [name], [name]. 03/30/2023 7:45 A M Medical Record Number: [id number] Patient Account Number: [id number] Date of Birth/Sex: Treating RN: May 16, 1955 (68 y.o. NETTY Claudene Blossom Primary Care Lori-Ann Lindfors: Edman Blunt Other Clinician: Referring Skipper Dacosta: Treating Kristilyn Coltrane/Extender: RO BSO N, [name] KANDICE Edman, Alexander Duos in Treatment: 18 Clinic Level of Care  Assessment Items TOOL 4 Quantity Score []  - 0 Use when only an EandM is performed on FOLLOW-UP visit ASSESSMENTS - Nursing Assessment / Reassessment []  - 0 Reassessment of Co-morbidities (includes updates in patient status) []  - 0 Reassessment of Adherence to Treatment Plan ASSESSMENTS - Wound and Skin A ssessment / Reassessment []  - 0 Simple Wound Assessment / Reassessment - one wound [name] HERO ([id number]) [id number] Page 2 of 10 []  - 0 Complex Wound Assessment / Reassessment - multiple wounds []  - 0 Dermatologic / Skin Assessment (not related to wound area) ASSESSMENTS - Focused Assessment []  - 0 Circumferential Edema Measurements - multi extremities []  - 0 Nutritional Assessment / Counseling / Intervention []  - 0 Lower Extremity Assessment (monofilament, tuning fork, pulses) []  - 0 Peripheral Arterial Disease Assessment (using hand held doppler) ASSESSMENTS - Ostomy and/or Continence Assessment and Care []  - 0 Incontinence Assessment and Management []  - 0 Ostomy Care Assessment and Management (repouching, etc.) PROCESS - Coordination of Care []  - 0 Simple Patient / Family Education for ongoing care []  - 0 Complex (extensive) Patient / Family Education for ongoing care []  - 0 Staff obtains Chiropractor, Records, T Results / Process Orders est []  - 0 Staff telephones HHA, Nursing Homes / Clarify orders / etc []  - 0 Routine Transfer to another Facility (non-emergent condition) []  - 0 Routine Hospital Admission (non-emergent condition) []  - 0 New Admissions / Manufacturing Engineer / Ordering NPWT Apligraf, etc. , []  - 0 Emergency Hospital Admission (emergent condition) []  - 0 Simple Discharge Coordination []  - 0 Complex (extensive) Discharge Coordination PROCESS - Special Needs []  - 0 Pediatric / Minor Patient Management []  - 0 Isolation Patient Management []  - 0 Hearing / Language / Visual special needs []  - 0 Assessment of  Community assistance (transportation, D/C planning, etc.) []  - 0 Additional assistance / Altered mentation []  - 0 Support Surface(s) Assessment (bed,  cushion, seat, etc.) INTERVENTIONS - Wound Cleansing / Measurement []  - 0 Simple Wound Cleansing - one wound []  - 0 Complex Wound Cleansing - multiple wounds []  - 0 Wound Imaging (photographs - any number of wounds) []  - 0 Wound Tracing (instead of photographs) []  - 0 Simple Wound Measurement - one wound []  - 0 Complex Wound Measurement - multiple wounds INTERVENTIONS - Wound Dressings []  - 0 Small Wound Dressing one or multiple wounds []  - 0 Medium Wound Dressing one or multiple wounds []  - 0 Large Wound Dressing one or multiple wounds []  - 0 Application of Medications - topical []  - 0 Application of Medications - injection INTERVENTIONS - Miscellaneous []  - 0 External ear exam []  - 0 Specimen Collection (cultures, biopsies, blood, body fluids, etc.) [name], [name] HERO ([id number]) [id number] Page 3 of 10 []  - 0 Specimen(s) / Culture(s) sent or taken to Lab for analysis []  - 0 Patient Transfer (multiple staff / Deitra Lift / Similar devices) []  - 0 Simple Staple / Suture removal (25 or less) []  - 0 Complex Staple / Suture removal (26 or more) []  - 0 Hypo / Hyperglycemic Management (close monitor of Blood Glucose) []  - 0 Ankle / Brachial Index (ABI) - do not check if billed separately []  - 0 Vital Signs Has the patient been seen at the hospital within the last three years: Yes Total Score: 0 Level Of Care: ____ Electronic Signature(s) Signed: 03/30/2023 5:14:10 PM By: Claudene Blossom MSN RN CNS WTA Entered By: Claudene Blossom on 03/30/2023 08:15:46 -------------------------------------------------------------------------------- Compression Therapy Details Patient Name: Date of Service: [name], [name] 03/30/2023 7:45 A M Medical Record Number: [id number] Patient Account Number: [id number] Date of  Birth/Sex: Treating RN: June 17, 1955 (68 y.o. NETTY Claudene Blossom Primary Care Westlyn Glaza: Edman Blunt Other Clinician: Referring Karolyne Timmons: Treating Kolbee Stallman/Extender: RO BSO LOISE, [name] KANDICE Edman, Blunt Duos in Treatment: 18 Compression Therapy Performed for Wound Assessment: Wound #1 Right,Lateral Lower Leg Performed By: Clinician Claudene Blossom, RN Compression Type: Four Layer Post Procedure Diagnosis Same as Pre-procedure Electronic Signature(s) Signed: 03/30/2023 5:14:10 PM By: Claudene Blossom MSN RN CNS WTA Entered By: Claudene Blossom on 03/30/2023 08:14:59 -------------------------------------------------------------------------------- Encounter Discharge Information Details Patient Name: Date of Service: [name], [name] 03/30/2023 7:45 A M Medical Record Number: [id number] Patient Account Number: [id number] Date of Birth/Sex: Treating RN: 04-12-1955 (68 y.o. NETTY Claudene Blossom Primary Care Elizabethann Lackey: Edman Blunt Other Clinician: Referring Nasire Reali: Treating Charlii Yost/Extender: CLAUDIE ARVELLA LOISE, [name] Karamalegos, Alexander Duos in Treatment: [address] [place] M ([id number]) [id number] Page 4 of 10 Encounter Discharge Information Items Discharge Condition: Stable Ambulatory Status: Ambulatory Discharge Destination: Home Transportation: Private Auto Accompanied By: wife Schedule Follow-up Appointment: Yes Clinical Summary of Care: Electronic Signature(s) Signed: 03/30/2023 8:40:33 AM By: Claudene Blossom MSN RN CNS WTA Entered By: Claudene Blossom on 03/30/2023 08:40:33 -------------------------------------------------------------------------------- Lower Extremity Assessment Details Patient Name: Date of Service: [name], [name] 03/30/2023 7:45 A M Medical Record Number: [id number] Patient Account Number: [id number] Date of Birth/Sex: Treating RN: 06-06-1955 (68 y.o. NETTY Claudene Blossom Primary Care Jakob Kimberlin: Edman Blunt Other  Clinician: Referring Kenniya Westrich: Treating Marce Charlesworth/Extender: RO BSO N, [name] KANDICE Edman, Alexander Duos in Treatment: 18 Edema Assessment Assessed: [Left: No] [Right: Yes] Edema: [Left: N] [Right: o] Calf Left: Right: Point of Measurement: 32 cm From Medial Instep 34 cm Ankle Left: Right: Point of Measurement: 11 cm From Medial Instep 22 cm Vascular Assessment Pulses: Dorsalis Pedis Palpable: [Right:Yes] Extremity colors, hair growth,  and conditions: Extremity Color: [Right:Normal] Hair Growth on Extremity: [Right:No] Temperature of Extremity: [Right:Warm] Capillary Refill: [Right:< 3 seconds] Dependent Rubor: [Right:No] Blanched when Elevated: [Right:No No] Toe Nail Assessment Left: Right: Thick: No Discolored: No Deformed: No Improper Length and Hygiene: No Electronic Signature(s) NYSHAUN, STANDAGE ([id number]) [id number] Page 5 of 10 Signed: 03/30/2023 5:14:10 PM By: Claudene Blossom MSN RN CNS WTA Entered By: Claudene Blossom on 03/30/2023 08:06:32 -------------------------------------------------------------------------------- Multi Wound Chart Details Patient Name: Date of Service: [name], [name] 03/30/2023 7:45 A M Medical Record Number: [id number] Patient Account Number: [id number] Date of Birth/Sex: Treating RN: [birth year] (68 y.o. NETTY Claudene Blossom Primary Care Rashawn Rolon: Edman Blunt Other Clinician: Referring Ewing Fandino: Treating Nella Botsford/Extender: RO BSO N, [name] KANDICE Edman, Alexander Devra in Treatment: 18 Vital Signs Height(in): 67 Pulse(bpm): 71 Weight(lbs): 174 Blood Pressure(mmHg): 129/72 Body Mass Index(BMI): 27.2 Temperature(F): 97.5 Respiratory Rate(breaths/min): 18 [1:Photos:] [N/A:N/A] Right, Lateral Lower Leg N/A N/A Wound Location: Gradually Appeared N/A N/A Wounding Event: Atypical N/A N/A Primary Etiology: Asthma, Hypertension N/A N/A Comorbid History: 08/29/2022 N/A N/A Date Acquired: 76 N/A  N/A Weeks of Treatment: Open N/A N/A Wound Status: No N/A N/A Wound Recurrence: Yes N/A N/A Clustered Wound: 7.5x3.5x0.1 N/A N/A Measurements L x W x D (cm) 20.617 N/A N/A A (cm) : rea 2.062 N/A N/A Volume (cm) : 78.30% N/A N/A % Reduction in A rea: 94.60% N/A N/A % Reduction in Volume: Full Thickness Without Exposed N/A N/A Classification: Support Structures Medium N/A N/A Exudate A mount: Serosanguineous N/A N/A Exudate Type: red, brown N/A N/A Exudate Color: None Present (0%) N/A N/A Granulation Amount: Large (67-100%) N/A N/A Necrotic Amount: Eschar N/A N/A Necrotic Tissue: Fat Layer (Subcutaneous Tissue): Yes N/A N/A Exposed Structures: Fascia: No Tendon: No Muscle: No Joint: No Bone: No None N/A N/A Epithelialization: Treatment Notes Electronic Signature(s) Kurdziel, [name] HERO ([id number]) [id number] Page 6 of 10 Signed: 03/30/2023 5:14:10 PM By: Claudene Blossom MSN RN CNS WTA Entered By: Claudene Blossom on 03/30/2023 08:14:39 -------------------------------------------------------------------------------- Multi-Disciplinary Care Plan Details Patient Name: Date of Service: [name], [name] 03/30/2023 7:45 A M Medical Record Number: [id number] Patient Account Number: [id number] Date of Birth/Sex: Treating RN: [birth year] (68 y.o. NETTY Claudene Blossom Primary Care Yarely Bebee: Edman Blunt Other Clinician: Referring Martin Belling: Treating Taylour Lietzke/Extender: RO BSO N, [name] KANDICE Edman, Alexander Devra in Treatment: 18 Active Inactive Wound/Skin Impairment Nursing Diagnoses: Knowledge deficit related to ulceration/compromised skin integrity Goals: Patient/caregiver will verbalize understanding of skin care regimen Date Initiated: 11/18/2022 Date Inactivated: 12/30/2022 Target Resolution Date: 12/19/2022 Goal Status: Met Ulcer/skin breakdown will have a volume reduction of 30% by week 4 Date Initiated: 11/18/2022 Date Inactivated:  12/30/2022 Target Resolution Date: 12/19/2022 Goal Status: Met Ulcer/skin breakdown will have a volume reduction of 50% by week 8 Date Initiated: 11/18/2022 Date Inactivated: 01/13/2023 Target Resolution Date: 01/18/2023 Goal Status: Met Ulcer/skin breakdown will have a volume reduction of 80% by week 12 Date Initiated: 11/18/2022 Date Inactivated: 02/17/2023 Target Resolution Date: 02/18/2023 Goal Status: Met Ulcer/skin breakdown will heal within 14 weeks Date Initiated: 11/18/2022 Target Resolution Date: 04/20/2023 Goal Status: Active Interventions: Assess patient/caregiver ability to obtain necessary supplies Assess patient/caregiver ability to perform ulcer/skin care regimen upon admission and as needed Assess ulceration(s) every visit Notes: Electronic Signature(s) Signed: 03/30/2023 5:14:10 PM By: Claudene Blossom MSN RN CNS WTA Entered By: Claudene Blossom on 03/30/2023 08:16:05 Mousseau, [name] HERO ([id number]) [id number] Page 7 of 10 -------------------------------------------------------------------------------- Pain Assessment Details Patient Name: Date of Service: V A SS, [name]  M. 03/30/2023 7:45 A M Medical Record Number: [id number] Patient Account Number: [id number] Date of Birth/Sex: Treating RN: 30-Nov-1955 (68 y.o. NETTY Claudene Blossom Primary Care Jerimey Burridge: Edman Blunt Other Clinician: Referring Delisia Mcquiston: Treating Griffon Herberg/Extender: RO BSO N, [name] KANDICE Edman, Blunt Duos in Treatment: 18 Active Problems Location of Pain Severity and Description of Pain Patient Has Paino No Site Locations Pain Management and Medication Current Pain Management: Electronic Signature(s) Signed: 03/30/2023 5:14:10 PM By: Claudene Blossom MSN RN CNS WTA Entered By: Claudene Blossom on 03/30/2023 07:55:36 -------------------------------------------------------------------------------- Patient/Caregiver Education Details Patient Name: Date of Service: [name] DELENA [name] 12/31/2024andnbsp7:45 A M Medical Record Number: [id number] Patient Account Number: [id number] Date of Birth/Gender: Treating RN: [birth year] (67 y.o. NETTY Claudene Blossom Primary Care Physician: Edman Blunt Other Clinician: Referring Physician: Treating Physician/Extender: CLAUDIE ARVELLA SAILOR, [name] Karamalegos, Alexander Duos in Treatment: [address], [place] M ([id number]) [phone].pdf Page 8 of 10 Education Assessment Education Provided To: Patient Education Topics Provided Wound/Skin Impairment: Handouts: Caring for Your Ulcer Methods: Explain/Verbal Responses: State content correctly Electronic Signature(s) Signed: 03/30/2023 5:14:10 PM By: Claudene Blossom MSN RN CNS WTA Entered By: Claudene Blossom on 03/30/2023 08:16:21 -------------------------------------------------------------------------------- Wound Assessment Details Patient Name: Date of Service: [name] DELENA [name], [name] 03/30/2023 7:45 A M Medical Record Number: [id number] Patient Account Number: [id number] Date of Birth/Sex: Treating RN: 1956/02/14 (68 y.o. NETTY Claudene Blossom Primary Care Starlet Gallentine: Edman Blunt Other Clinician: Referring Samayra Hebel: Treating Honora Searson/Extender: RO BSO N, [name] KANDICE Edman, Alexander Duos in Treatment: 18 Wound Status Wound Number: 1 Primary Etiology: Atypical Wound Location: Right, Lateral Lower Leg Wound Status: Open Wounding Event: Gradually Appeared Comorbid History: Asthma, Hypertension Date Acquired: 08/29/2022 Weeks Of Treatment: 18 Clustered Wound: Yes Photos Wound Measurements Length: (cm) 7.5 Width: (cm) 3.5 Depth: (cm) 0.1 Area: (cm) 20.617 Volume: (cm) 2.062 % Reduction in Area: 78.3% % Reduction in Volume: 94.6% Epithelialization: None Wound Description Classification: Full Thickness Without Exposed Support Structures Exudate Amount: Medium Exudate Type: Serosanguineous Resch, [name] M ([id number]) Exudate Color: red, brown Foul  Odor After Cleansing: No Slough/Fibrino Yes [phone].pdf Page 9 of 10 Wound Bed Granulation Amount: None Present (0%) Exposed Structure Necrotic Amount: Large (67-100%) Fascia Exposed: No Necrotic Quality: Eschar Fat Layer (Subcutaneous Tissue) Exposed: Yes Tendon Exposed: No Muscle Exposed: No Joint Exposed: No Bone Exposed: No Treatment Notes Wound #1 (Lower Leg) Wound Laterality: Right, Lateral Cleanser Vashe 5.8 (oz) Discharge Instruction: Use vashe 5.8 (oz) as directed Peri-Wound Care Topical Triamcinolone Acetonide Cream, 0.1%, 15 (g) tube Discharge Instruction: Apply as directed by Xiomar Crompton. Primary Dressing Hydrofera Blue Ready Transfer Foam, 4x5 (in/in) Discharge Instruction: Apply Hydrofera Blue Ready to wound bed as directed Secondary Dressing ABD Pad 5x9 (in/in) Discharge Instruction: Cover with ABD pad Secured With Compression Wrap Urgo K2, two layer compression system, regular Compression Stockings Add-Ons Electronic Signature(s) Signed: 03/30/2023 5:14:10 PM By: Claudene Blossom MSN RN CNS WTA Entered By: Claudene Blossom on 03/30/2023 08:03:41 -------------------------------------------------------------------------------- Vitals Details Patient Name: Date of Service: [name] DELENA [name], [name] 03/30/2023 7:45 A M Medical Record Number: [id number] Patient Account Number: [id number] Date of Birth/Sex: Treating RN: 1955/04/21 (68 y.o. NETTY Claudene Blossom Primary Care Winslow Verrill: Edman Blunt Other Clinician: Referring Adiva Boettner: Treating Lachlan Pelto/Extender: RO BSO N, [name] Karamalegos, Alexander Duos in Treatment: 18 Vital Signs Time Taken: 07:55 Temperature (F): 97.5 Height (in): 67 Pulse (bpm): 71 Weight (lbs): 174 Respiratory Rate (breaths/min): 18 Body Mass Index (BMI): 27.2 Blood Pressure (mmHg): 129/72 Reference Range: 80 - 120 mg /  dl [name], [name] ([id number]) [id number].pdf Page 10 of 10 Electronic  Signature(s) Signed: 03/30/2023 5:14:10 PM By: Claudene Blossom MSN RN CNS WTA Entered By: Claudene Blossom on 03/30/2023 07:55:28

## 2023-03-31 NOTE — Progress Notes (Signed)
 SREEKAR, BROYHILL ([id number]) [id number] Page 1 of 7 Visit Report for 03/30/2023 HPI Details Patient Name: Date of Service: [name], DOCTER. 03/30/2023 7:45 A M Medical Record Number: [id number] Patient Account Number: [id number] Date of Birth/Sex: Treating RN: Aug 10, 1955 (68 y.o. NETTY Claudene Blossom Primary Care Provider: Edman Blunt Other Clinician: Referring Provider: Treating Provider/Extender: RO BSO N, [name] Karamalegos, Blunt Duos in Treatment: 18 History of Present Illness HPI Description: 11/18/2022 Mr. Vern Guerette is a 68 year old male with a past medical history of nerve conduction deafness that presents to the clinic for a 7 month history of nonhealing ulcers to the right lower extremity. He states these happened spontaneously and have progressively gotten worse. The more proximal wound has been present for 7 months and the most recent wound on the top of the foot/distal leg has been present for the past month. He has 5 wounds total. He visited the ED on 11/04/2022 for this issue. He was given cephalexin  for which he completed. He has been using Several ointments at different times to address this issue. These ointments include Silvadene , mupirocin  and clobetasol .. At times he keeps the wounds open to air. He also states he will go in the pool with the wounds exposed. For work he is on his feet for long periods of time. He does not wear compression stockings. ABIs in office were 0.8. He currently denies signs of infection. 8/28; patient presents for follow-up. He has been using Vashe wet-to-dry dressings to the wound beds. Patient's wound culture showed Pseudomonas aeruginosa sensitive to ciprofloxacin and this was sent into the pharmacy. Also recommended Keystone antibiotic ointment And this has already been ordered. He has not received this yet. He had a skin biopsy done as well that showed fibrosis inflammation. This was nonspecific. Patient  declined debridement due to chronic pain. 9/4; patient presents for follow-up. He has been using Vashe wet-to-dry dressings to the wound beds. He obtains the [facility] antibiotic ointment tomorrow. He has 1 day left of his oral antibiotics. There has been improvement in size since last clinic visit to the wound beds. He currently denies systemic signs of infection. 9/11; patient presents for follow-up. He has been using Keystone antibiotic ointment to the wound beds. He has tolerated this treatment well. Wound measurements are stable however periwound appears less irritated. 9/18; patient using Keystone and the ABD pad and Tubigrip. Unfortunately the Keystone cream coalesced over the surface of the wounds requiring an aggressive debridement to remove it. Under the [facility] the surface of the wound was not viable requiring further aggressive debridement. The patient has 4 wounds on the right lateral leg which are venous in etiology and a clustered localized area 9/25; this patient has a difficult cluster of wounds on the right lateral lower leg and ankle. Very painful. We have been using Keystone, Hydrofera Blue and Tubigrip compression. His ABI is 0.8 in this clinic. As far as I can see he is not seen vein and vascular for reflux studies. A biopsy was done in our clinic that just showed scar tissue no other identifiable pathology. 10/2; this patient has a cluster of wounds on the right lateral lower leg and ankle. He has surrounding stasis dermatitis no doubt chronic venous insufficiency with hemosiderin deposition. I have also been concerned about the difficulty feeling his posterior tibial and dorsalis pedis pulses although his ABI in the clinic was 0.8. He definitely has arterial studies on Thursday, I thought he might have venous reflux studies at  the same time if not he is going to need these [name]. Likely will need to see vascular surgery as well. We have been using Keystone Hydrofera Blue  ABDs and Tubigrip x 2 10/9 the patient is gone for his vascular testing. Fortunately the arterial studies were fairly normal although his ABI was noncompressible at 1.32 on the right is TBI at 0.92 is normal. Triphasic and biphasic waveforms. On the left ABI was normal triphasic waveforms and normal TBI in terms of the venous reflux studies surprisingly benign. He had no evidence of a DVT no superficial throat thrombosis. He did have venous reflux noted in the right greater saphenous vein in the calf although this is not the drainage area where his current wounds are on the right lateral calf. There was no reflux in the small saphenous vein. We are using Sorbact and hydrogel as the primary dressing also using Keystone. 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 Wounds are better this week and he tolerated the Urgo K2 compression well set it was comfortable and had no issues with it. Wound surfaces are better and there is less surrounding erythema around this cluster of difficult wounds on the right lateral ankle and lower leg 10/23; appointment with the vascular surgery on November 5. Question is would he benefit from a greater saphenous vein ablation with regards to helping these wounds heal. I was hoping for a better outcome today however he has a yellowish gritty surface requiring debridement. We have been using Sorbact hydrogel. He was denied for Apligraf we will put TheraSkin through his [employer] based charles schwab. Not sure if this is a class the mile or not. He has not had any pain and says things feel better. He is tolerating the full 4-layer equivalent compression 10/30; appointment with vascular surgery on November 5. He comes in today with a fair amount of drainage here.  He had 4 wounds in this area with significant surrounding stasis dermatitis they have coalesced now into 3 wounds. We have been using Sorbact and hydrogel. He was denied for Apligraf and we are still waiting to hear about TheraSkin although this may be a class the denile. He seemed to be doing well at the beginning of the month after putting TCA in the 4- layer compression on him however he now seems to have plateaued. 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 His wife is asking about changing this at home again when we were using Tubigrip's. His arterial studies were previously reasonably unremarkable. 02-03-2023 upon evaluation today patient appears to be doing pretty well currently in regard to his wound. He actually did see vascular yesterday and they JOURDIN, CONNORS ([id number]) [id number] Page 2 of 7 actually discussed a venous ablation surgery for him. I do think that this will be appropriate and I think it would help him as well. Fortunately I do not see any signs of active infection locally or systemically which is good news. There is some need for some sharp debridement but in general he seems to be making good progress here. 02-10-2023 upon evaluation today patient's wound does show signs of quite a bit of drainage but I do not see obvious signs of infection at this time I  discussed that with him today. With that being said I think that the silver  cell may be trapping a lot of fluid up underneath the edge of this and I think if we were to switch something out to say Hydrofera Blue this may actually do better. I discussed that with him today. 02-17-2023 upon evaluation today patient appears to be doing well currently in regard to his wounds. He has been tolerating the dressing changes I did have to perform some debridement today wound actually looks significantly better I am actually very pleased with where we stand I do believe that he is  making excellent headway towards closure. 02-24-2023 upon evaluation today patient appears to be doing well currently in regard to his wound which is showing some signs of improvement which is excellent news. Fortunately I do not see any signs of active infection looking or systemically which is great news as well. No fevers, chills, nausea, vomiting, or diarrhea. We have gotten approval for TheraSkin but there was some issues with the provider in network status. Specifically with integrated wound specialist. Apparently were in network in a couple locations but I got Dorothe to check it but not with the one that we bill under. Nonetheless I think that this is something that can be fixed and needs to be fixed ASAP. 03-10-2023 upon evaluation today patient's wound is actually showing signs of excellent improvement and looks to be doing much better. Fortunately I do not see any signs of active infection at this time which is great news that he has a little bit of irritation around the edges of the wound. I am going to recommend that we go ahead and work on diet in particular as far as cream is concerned I recommend topical mixup triamcinolone and nystatin to see if we help with some of the itching and irritation there. Otherwise the wound itself appears to be doing really well. 03-17-2023 upon evaluation today patient appears to be doing well currently in regard to his wounds. He is actually tolerating the dressing changes without complication. There is gena be some need here for sharp debridement and in general I do believe that he is making good headway towards closure. 03-23-2023 upon evaluation today patient appears to be doing excellent in regard to his wound. He has been tolerating the dressing changes without complication. Fortunately I do not see any signs of active infection at this time. 12/31; patient has 4 small wounds on the right lateral ankle in the setting of chronic venous insufficiency.  He is back at work. He is apparently on a waiting list for a venous ablation type procedure. I remember him from earlier in the year at that time he had really significant surrounding inflammation/infection. That is completely a bit abated now Electronic Signature(s) Signed: 03/30/2023 4:49:51 PM By: Rufus Sharper MD Entered By: Rufus Sharper on 03/30/2023 08:22:06 -------------------------------------------------------------------------------- Physical Exam Details Patient Name: Date of Service: [name], [name] 03/30/2023 7:45 A M Medical Record Number: [id number] Patient Account Number: [id number] Date of Birth/Sex: Treating RN: April 16, 1955 (68 y.o. NETTY Claudene Blossom Primary Care Provider: Edman Blunt Other Clinician: Referring Provider: Treating Provider/Extender: RO BSO N, [name] Karamalegos, Alexander Devra in Treatment: 18 Constitutional Sitting or standing Blood Pressure is within target range for patient.. Pulse regular and within target range for patient.SABRA Respirations regular, non-labored and within target range.. Temperature is normal and within the target range for the patient.SABRA appears in no distress. Notes Wound exam; he has 4  areas of the major 1 and 3 smaller areas. All of this looks a lot better than I remember. There is no surrounding erythema which is quite a bit different. No swelling no palpable tenderness. His edema is well-controlled Electronic Signature(s) Signed: 03/30/2023 4:49:51 PM By: Rufus Sharper MD Entered By: Rufus Sharper on 03/30/2023 08:22:54 Kroon, CREED HERO ([id number]) [id number] Page 3 of 7 -------------------------------------------------------------------------------- Physician Orders Details Patient Name: Date of Service: DONJUAN, ROBISON 03/30/2023 7:45 A M Medical Record Number: [id number] Patient Account Number: [id number] Date of Birth/Sex: Treating RN: May 07, 1955 (68 y.o. NETTY Claudene Blossom Primary Care Provider: Edman Blunt Other Clinician: Referring Provider: Treating Provider/Extender: RO BSO LOISE, [name] Karamalegos, Blunt Duos in Treatment: 18 The following information was scribed by: Claudene Blossom The information was scribed for: RUFUS SHARPER MATSU Verbal / Phone Orders: No Diagnosis Coding Follow-up Appointments Return Appointment in 1 week. Nurse Visit as needed Bathing/ Shower/ Hygiene May shower with wound dressing protected with water repellent cover or cast protector. No tub bath. Anesthetic (Use 'Patient Medications' Section for Anesthetic Order Entry) Lidocaine applied to wound bed Wound Treatment Wound #1 - Lower Leg Wound Laterality: Right, Lateral Cleanser: Vashe 5.8 (oz) Every Other Day/30 Days Discharge Instructions: Use vashe 5.8 (oz) as directed Topical: Triamcinolone Acetonide Cream, 0.1%, 15 (g) tube Every Other Day/30 Days Discharge Instructions: Apply as directed by provider. Prim Dressing: Hydrofera Blue Ready Transfer Foam, 4x5 (in/in) Every Other Day/30 Days ary Discharge Instructions: Apply Hydrofera Blue Ready to wound bed as directed Secondary Dressing: ABD Pad 5x9 (in/in) (Generic) Every Other Day/30 Days Discharge Instructions: Cover with ABD pad Compression Wrap: Urgo K2, two layer compression system, regular Every Other Day/30 Days Electronic Signature(s) Signed: 03/30/2023 4:49:51 PM By: Rufus Sharper MD Signed: 03/30/2023 5:14:10 PM By: Claudene Blossom MSN RN CNS WTA Entered By: Claudene Blossom on 03/30/2023 08:15:36 Problem List Details -------------------------------------------------------------------------------- GINETTE CREED HERO ([id number]) [id number] Page 4 of 7 Patient Name: Date of Service: KIYAN, BURMESTER 03/30/2023 7:45 A M Medical Record Number: [id number] Patient Account Number: [id number] Date of Birth/Sex: Treating RN: 09-19-1955 (68 y.o. 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 03/30/2023 7:45 A M Medical Record Number: [id number] Patient Account Number: [id number] Date of Birth/Sex: Treating RN: 07/11/1955 (68 y.o. NETTY Claudene Blossom Primary Care Provider: Edman Blunt Other Clinician: Referring Provider: Treating Provider/Extender: RO BSO N, [name] Karamalegos, Blunt Duos in Treatment: 18 Subjective History of Present Illness (HPI) 11/18/2022 Mr. Benedict Kue is a 68 year old male with a past medical history of nerve conduction deafness that presents to the clinic for a 7 month history of nonhealing ulcers to the right lower extremity. He states these happened spontaneously and have progressively gotten worse. The more proximal wound has been present for 7 months and the most recent wound on the top of the foot/distal leg has  been present for the past month. He has 5 wounds total. He visited the ED on 11/04/2022 for this issue. He was given cephalexin  for which he completed. He has been using Several ointments at different times to address this issue. These ointments include Silvadene , mupirocin  and  clobetasol .. At times he keeps the wounds open to air. He also states he will go in the pool with the wounds exposed. For work he is on his feet for long periods of time. He does not wear compression stockings. ABIs in office were 0.8. He currently denies signs of infection. 8/28; patient presents for follow-up. He has been using Vashe wet-to-dry dressings to the wound beds. Patient's wound culture showed Pseudomonas aeruginosa sensitive to ciprofloxacin and this was sent into the pharmacy. Also recommended Keystone antibiotic ointment And this has already been ordered. He has not received this yet. He had a skin biopsy done as well that showed fibrosis inflammation. This was nonspecific. Patient declined debridement due to chronic pain. 9/4; patient presents for follow-up. He has been using Vashe wet-to-dry dressings to the wound beds. He obtains the [facility] antibiotic ointment tomorrow. He has 1 day left of his oral antibiotics. There has been improvement in size since last clinic visit to the wound beds. He currently denies systemic signs of infection. DAWIT, TANKARD ([id number]) [id number] Page 5 of 7 9/11; patient presents for follow-up. He has been using Keystone antibiotic ointment to the wound beds. He has tolerated this treatment well. Wound measurements are stable however periwound appears less irritated. 9/18; patient using Keystone and the ABD pad and Tubigrip. Unfortunately the Keystone cream coalesced over the surface of the wounds requiring an aggressive debridement to remove it. Under the [facility] the surface of the wound was not viable requiring further aggressive debridement. The patient has 4 wounds on the right lateral leg which are venous in etiology and a clustered localized area 9/25; this patient has a difficult cluster of wounds on the right lateral lower leg and ankle. Very painful. We have been using Keystone, Hydrofera Blue and Tubigrip  compression. His ABI is 0.8 in this clinic. As far as I can see he is not seen vein and vascular for reflux studies. A biopsy was done in our clinic that just showed scar tissue no other identifiable pathology. 10/2; this patient has a cluster of wounds on the right lateral lower leg and ankle. He has surrounding stasis dermatitis no doubt chronic venous insufficiency with hemosiderin deposition. I have also been concerned about the difficulty feeling his posterior tibial and dorsalis pedis pulses although his ABI in the clinic was 0.8. He definitely has arterial studies on Thursday, I thought he might have venous reflux studies at the same time if not he is going to need these [name]. Likely will need to see vascular surgery as well. We have been using Keystone Hydrofera Blue ABDs and Tubigrip x 2 10/9 the patient is gone for his vascular testing. Fortunately the arterial studies were fairly normal although his ABI was noncompressible at 1.32 on the right is TBI at 0.92 is normal. Triphasic and biphasic waveforms. On the left ABI was normal triphasic waveforms and normal TBI in terms of the venous reflux studies surprisingly benign. He had no evidence of a DVT no superficial throat thrombosis. He did have venous reflux noted in the right greater saphenous vein in the calf although this is not the drainage area where his current wounds are on the right lateral calf. There was no  reflux in the small saphenous vein. We are using Sorbact and hydrogel as the primary dressing also using Keystone. 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 Wounds are better this week and he tolerated the Urgo K2 compression well set it was comfortable and had no issues with it. Wound surfaces are better and there is  less surrounding erythema around this cluster of difficult wounds on the right lateral ankle and lower leg 10/23; appointment with the vascular surgery on November 5. Question is would he benefit from a greater saphenous vein ablation with regards to helping these wounds heal. I was hoping for a better outcome today however he has a yellowish gritty surface requiring debridement. We have been using Sorbact hydrogel. He was denied for Apligraf we will put TheraSkin through his [employer] based charles schwab. Not sure if this is a class the mile or not. He has not had any pain and says things feel better. He is tolerating the full 4-layer equivalent compression 10/30; appointment with vascular surgery on November 5. He comes in today with a fair amount of drainage here. He had 4 wounds in this area with significant surrounding stasis dermatitis they have coalesced now into 3 wounds. We have been using Sorbact and hydrogel. He was denied for Apligraf and we are still waiting to hear about TheraSkin although this may be a class the denile. He seemed to be doing well at the beginning of the month after putting TCA in the 4- layer compression on him however he now seems to have plateaued. 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 His wife is asking about changing this at home again when we were using Tubigrip's. His arterial studies were previously reasonably unremarkable. 02-03-2023 upon evaluation today patient appears to be doing pretty well currently in regard to his wound. He actually did see vascular yesterday and they actually discussed a venous ablation surgery for him. I do think that this will be appropriate and I think it would help him as well. Fortunately I do not see any signs of active infection locally or systemically which is good news. There is some need for some sharp debridement but in general he seems to be making good progress here. 02-10-2023 upon  evaluation today patient's wound does show signs of quite a bit of drainage but I do not see obvious signs of infection at this time I discussed that with him today. With that being said I think that the silver  cell may be trapping a lot of fluid up underneath the edge of this and I think if we were to switch something out to say Hydrofera Blue this may actually do better. I discussed that with him today. 02-17-2023 upon evaluation today patient appears to be doing well currently in regard to his wounds. He has been tolerating the dressing changes I did have to perform some debridement today wound actually looks significantly better I am actually very pleased with where we stand I do believe that he is making excellent headway towards closure. 02-24-2023 upon evaluation today patient appears to be doing well currently in regard to his wound which is showing some signs of improvement which is excellent news. Fortunately I do not see any signs of  active infection looking or systemically which is great news as well. No fevers, chills, nausea, vomiting, or diarrhea. We have gotten approval for TheraSkin but there was some issues with the provider in network status. Specifically with integrated wound specialist. Apparently were in network in a couple locations but I got Dorothe to check it but not with the one that we bill under. Nonetheless I think that this is something that can be fixed and needs to be fixed ASAP. 03-10-2023 upon evaluation today patient's wound is actually showing signs of excellent improvement and looks to be doing much better. Fortunately I do not see any signs of active infection at this time which is great news that he has a little bit of irritation around the edges of the wound. I am going to recommend that we go ahead and work on diet in particular as far as cream is concerned I recommend topical mixup triamcinolone and nystatin to see if we help with some of the itching and  irritation there. Otherwise the wound itself appears to be doing really well. 03-17-2023 upon evaluation today patient appears to be doing well currently in regard to his wounds. He is actually tolerating the dressing changes without complication. There is gena be some need here for sharp debridement and in general I do believe that he is making good headway towards closure. 03-23-2023 upon evaluation today patient appears to be doing excellent in regard to his wound. He has been tolerating the dressing changes without complication. Fortunately I do not see any signs of active infection at this time. 12/31; patient has 4 small wounds on the right lateral ankle in the setting of chronic venous insufficiency. He is back at work. He is apparently on a waiting list for a venous ablation type procedure. I remember him from earlier in the year at that time he had really significant surrounding inflammation/infection. That is completely a bit abated now Objective Constitutional Sitting or standing Blood Pressure is within target range for patient.. Pulse regular and within target range for patient.SABRA Respirations regular, non-labored and within target range.. Temperature is normal and within the target range for the patient.SABRA appears in no distress. Vitals Time Taken: 7:55 AM, Height: 67 in, Weight: 174 lbs, BMI: 27.2, Temperature: 97.5 F, Pulse: 71 bpm, Respiratory Rate: 18 breaths/min, Blood Pressure: 129/72 mmHg. KERMAN, PFOST ([id number]) [id number] Page 6 of 7 General Notes: Wound exam; he has 4 areas of the major 1 and 3 smaller areas. All of this looks a lot better than I remember. There is no surrounding erythema which is quite a bit different. No swelling no palpable tenderness. His edema is well-controlled Integumentary (Hair, Skin) Wound #1 status is Open. Original cause of wound was Gradually Appeared. The date acquired was: 08/29/2022. The wound has been in treatment  18 weeks. The wound is located on the Right,Lateral Lower Leg. The wound measures 7.5cm length x 3.5cm width x 0.1cm depth; 20.617cm^2 area and 2.062cm^3 volume. There is Fat Layer (Subcutaneous Tissue) exposed. There is a medium amount of serosanguineous drainage noted. There is no granulation within the wound bed. There is a large (67-100%) amount of necrotic tissue within the wound bed including Eschar. 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  There was a Four Layer Compression Therapy Procedure by Claudene Blossom, RN. Post procedure Diagnosis Wound #1: Same as Pre-Procedure Plan Follow-up Appointments: Return Appointment in 1 week. Nurse Visit as needed Bathing/ Shower/ Hygiene: May shower with wound dressing protected with water repellent cover or cast protector. No tub bath. Anesthetic (Use 'Patient Medications' Section for Anesthetic Order Entry): Lidocaine applied to wound bed WOUND #1: - Lower Leg Wound Laterality: Right, Lateral Cleanser: Vashe 5.8 (oz) Every Other Day/30 Days Discharge Instructions: Use vashe 5.8 (oz) as directed Topical: Triamcinolone Acetonide Cream, 0.1%, 15 (g) tube Every Other Day/30 Days Discharge Instructions: Apply as directed by provider. Prim Dressing: Hydrofera Blue Ready Transfer Foam, 4x5 (in/in) Every Other Day/30 Days ary Discharge Instructions: Apply Hydrofera Blue Ready to wound bed as directed Secondary Dressing: ABD Pad 5x9 (in/in) (Generic) Every Other Day/30 Days Discharge Instructions: Cover with ABD pad Com pression Wrap: Urgo K2, two layer compression system, regular Every Other Day/30 Days 1. Continue the  primary dressing which is Hydrofera Blue, TCA, ABD still under and Urgo K2 lite. 2. I think the healing is been hampered by his continued working although I certainly understand his rationale for continuing. He is on his feet for long periods. 3. Still awaiting a venous ablation procedure Electronic Signature(s) Signed: 03/30/2023 4:49:51 PM By: Rufus Sharper MD Entered By: Rufus Sharper on 03/30/2023 08:23:53 Craze, CREED HERO ([id number]) [id number] Page 7 of 7 -------------------------------------------------------------------------------- SuperBill Details Patient Name: Date of Service: JEDD, [place] 03/30/2023 Medical Record Number: [id number] Patient Account Number: [id number] Date of Birth/Sex: Treating RN: 1955/05/27 (68 y.o. 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 L97.818 Non-pressure chronic ulcer of other part of right lower leg with other specified severity 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, unspecified Facility Procedures : CPT4 Code: [id number] Description: (Facility Use Only) [phone] - APPLY MULTLAY COMPRS LWR RT LEG Modifier: Quantity: 1 Physician Procedures : CPT4 Code Description Modifier [id number] - WC PHYS LEVEL 3 - EST PT ICD-10 Diagnosis Description L97.818 Non-pressure chronic ulcer of other part of right lower leg with other specified severity I87.311 Chronic venous hypertension (idiopathic) with  ulcer of right lower extremity Quantity: 1 Electronic Signature(s) Signed: 03/30/2023 4:49:51 PM By: Rufus Sharper MD Entered By: Rufus Sharper on 03/30/2023 08:24:11

## 2023-04-07 ENCOUNTER — Encounter: Payer: BC Managed Care – PPO | Attending: Internal Medicine | Admitting: Physician Assistant

## 2023-04-07 DIAGNOSIS — H908 Mixed conductive and sensorineural hearing loss, unspecified: Secondary | ICD-10-CM | POA: Diagnosis not present

## 2023-04-07 DIAGNOSIS — I87311 Chronic venous hypertension (idiopathic) with ulcer of right lower extremity: Secondary | ICD-10-CM | POA: Insufficient documentation

## 2023-04-07 DIAGNOSIS — I10 Essential (primary) hypertension: Secondary | ICD-10-CM | POA: Diagnosis not present

## 2023-04-07 DIAGNOSIS — J45909 Unspecified asthma, uncomplicated: Secondary | ICD-10-CM | POA: Insufficient documentation

## 2023-04-07 DIAGNOSIS — L97818 Non-pressure chronic ulcer of other part of right lower leg with other specified severity: Secondary | ICD-10-CM | POA: Diagnosis not present

## 2023-04-07 DIAGNOSIS — L97812 Non-pressure chronic ulcer of other part of right lower leg with fat layer exposed: Secondary | ICD-10-CM | POA: Diagnosis not present

## 2023-04-07 NOTE — Progress Notes (Addendum)
 [name], [name] ([id number]) [id number] Page 1 of 9 Visit Report for 04/07/2023 Chief Complaint Document Details Patient Name: Date of Service: [name], [name]. 04/07/2023 7:45 A M Medical Record Number: [id number] Patient Account Number: [id number] Date of Birth/Sex: Treating RN: 1955/06/03 (68 y.o. [name] Primary Care Provider: Edman [name] Other Clinician: Referring Provider: Treating Provider/Extender: [name] [name] Devra in Treatment: 20 Information Obtained from: Patient Chief Complaint Right lower extremity ulcer Electronic Signature(s) Signed: 04/07/2023 8:01:34 AM By: [name] Andre PA-C Entered By: [name] on 04/07/2023 05:01:34 -------------------------------------------------------------------------------- Debridement Details Patient Name: Date of Service: [name], [name] 04/07/2023 7:45 A M Medical Record Number: [id number] Patient Account Number: [id number] Date of Birth/Sex: Treating RN: 05-Oct-1955 (68 y.o. [name] Primary Care Provider: Edman [name] Other Clinician: Referring Provider: Treating Provider/Extender: [name] [name] Devra in Treatment: 20 Debridement Performed for Assessment: Wound #1 Right,Lateral Lower Leg Performed By: Physician [name] Andre, PA-C The following information was scribed by: [name] The information was scribed for: [name] Debridement Type: Debridement Level of Consciousness (Pre-procedure): Awake and Alert Pre-procedure Verification/Time Out Yes - 08:16 Taken: Start Time: 08:16 Pain Control: Lidocaine 4% T opical Solution Percent of Wound Bed Debrided: 100% T Area Debrided (cm): otal 20.61 Tissue and other material debrided: Viable, Non-Viable, Slough, Subcutaneous, Biofilm, Slough Level: Skin/Subcutaneous Tissue Debridement Description: Excisional Instrument: Curette Bleeding: Minimum Hemostasis Achieved: Pressure [name],  [name] ([id number]) [id number] Page 2 of 9 Procedural Pain: 0 Post Procedural Pain: 0 Response to Treatment: Procedure was tolerated well Level of Consciousness (Post- Awake and Alert procedure): Post Debridement Measurements of Total Wound Length: (cm) 7.5 Width: (cm) 3.5 Depth: (cm) 0.1 Volume: (cm) 2.062 Character of Wound/Ulcer Post Debridement: Stable Post Procedure Diagnosis Same as Pre-procedure Electronic Signature(s) Signed: 04/07/2023 5:32:36 PM By: [name] Blossom MSN RN CNS WTA Signed: 04/07/2023 6:16:28 PM By: [name] Andre PA-C Entered By: [name] on 04/07/2023 05:17:04 -------------------------------------------------------------------------------- HPI Details Patient Name: Date of Service: [name], [name] 04/07/2023 7:45 A M Medical Record Number: [id number] Patient Account Number: [id number] Date of Birth/Sex: Treating RN: 28-Dec-1955 (68 y.o. [name] Primary Care Provider: Edman [name] Other Clinician: Referring Provider: Treating Provider/Extender: [name] [name] Devra in Treatment: 20 History of Present Illness HPI Description: 11/18/2022 [name] is a 68 year old male with a past medical history of nerve conduction deafness that presents to the clinic for a 7 month history of nonhealing ulcers to the right lower extremity. He states these happened spontaneously and have progressively gotten worse. The more proximal wound has been present for 7 months and the most recent wound on the top of the foot/distal leg has been present for the past month. He has 5 wounds total. He visited the ED on 11/04/2022 for this issue. He was given cephalexin  for which he completed. He has been using Several ointments at different times to address this issue. These ointments include Silvadene , mupirocin  and clobetasol .. At times he keeps the wounds open to air. He also states he will go in the pool with the  wounds exposed. For work he is on his feet for long periods of time. He does not wear compression stockings. ABIs in office were 0.8. He currently denies signs of infection. 8/28; patient presents for follow-up. He has been using Vashe wet-to-dry dressings to the wound beds. Patient's wound culture showed Pseudomonas aeruginosa sensitive to ciprofloxacin and this was sent into the pharmacy. Also recommended Keystone antibiotic ointment  And this has already been ordered. He has not received this yet. He had a skin biopsy done as well that showed fibrosis inflammation. This was nonspecific. Patient declined debridement due to chronic pain. 9/4; patient presents for follow-up. He has been using Vashe wet-to-dry dressings to the wound beds. He obtains the [facility] antibiotic ointment tomorrow. He has 1 day left of his oral antibiotics. There has been improvement in size since last clinic visit to the wound beds. He currently denies systemic signs of infection. 9/11; patient presents for follow-up. He has been using Keystone antibiotic ointment to the wound beds. He has tolerated this treatment well. Wound measurements are stable however periwound appears less irritated. 9/18; patient using Keystone and the ABD pad and Tubigrip. Unfortunately the Keystone cream coalesced over the surface of the wounds requiring an aggressive debridement to remove it. Under the [facility] the surface of the wound was not viable requiring further aggressive debridement. The patient has 4 wounds on the right lateral leg which are venous in etiology and a clustered localized area 9/25; this patient has a difficult cluster of wounds on the right lateral lower leg and ankle. Very painful. We have been using Keystone, Hydrofera Blue and Tubigrip compression. His ABI is 0.8 in this clinic. As far as I can see he is not seen vein and vascular for reflux studies. A biopsy was done in our clinic that just showed scar tissue no other  identifiable pathology. 10/2; this patient has a cluster of wounds on the right lateral lower leg and ankle. He has surrounding stasis dermatitis no doubt chronic venous insufficiency with hemosiderin deposition. I have also been concerned about the difficulty feeling his posterior tibial and dorsalis pedis pulses although his ABI in the clinic was 0.8. He definitely has arterial studies on Thursday, I thought he might have venous reflux studies at the same time if not he is going to need these [name]. Likely will need to see vascular surgery as well. We have been using Keystone Hydrofera Blue ABDs and Tubigrip x 2 Derusha, Torrie M ([id number]) [id number] Page 3 of 9 10/9 the patient is gone for his vascular testing. Fortunately the arterial studies were fairly normal although his ABI was noncompressible at 1.32 on the right is TBI at 0.92 is normal. Triphasic and biphasic waveforms. On the left ABI was normal triphasic waveforms and normal TBI in terms of the venous reflux studies surprisingly benign. He had no evidence of a DVT no superficial throat thrombosis. He did have venous reflux noted in the right greater saphenous vein in the calf although this is not the drainage area where his current wounds are on the right lateral calf. There was no reflux in the small saphenous vein. We are using Sorbact and hydrogel as the primary dressing also using Keystone. 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 Wounds are better this week and he tolerated the Urgo K2 compression well set it was comfortable and had no issues with it. Wound surfaces are better and there is less surrounding erythema around this cluster of difficult wounds on the right lateral ankle and  lower leg 10/23; appointment with the vascular surgery on November 5. Question is would he benefit from a greater saphenous vein ablation with regards to helping  these wounds heal. I was hoping for a better outcome today however he has a yellowish gritty surface requiring debridement. We have been using Sorbact hydrogel. He was denied for Apligraf we will put TheraSkin through his [employer] based charles schwab. Not sure if this is a class the mile or not. He has not had any pain and says things feel better. He is tolerating the full 4-layer equivalent compression 10/30; appointment with vascular surgery on November 5. He comes in today with a fair amount of drainage here. He had 4 wounds in this area with significant surrounding stasis dermatitis they have coalesced now into 3 wounds. We have been using Sorbact and hydrogel. He was denied for Apligraf and we are still waiting to hear about TheraSkin although this may be a class the denile. He seemed to be doing well at the beginning of the month after putting TCA in the 4- layer compression on him however he now seems to have plateaued. 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 His wife is asking about changing this at home again when we were using Tubigrip's. His arterial studies were previously reasonably unremarkable. 02-03-2023 upon evaluation today patient appears to be doing pretty well currently in regard to his wound. He actually did see vascular yesterday and they actually discussed a venous ablation surgery for him. I do think that this will be appropriate and I think it would help him as well. Fortunately I do not see any signs of active infection locally or systemically which is good news. There is some need for some sharp debridement but in general he seems to be making good progress here. 02-10-2023 upon evaluation today patient's wound does show signs of quite a bit of drainage but I do not see obvious  signs of infection at this time I discussed that with him today. With that being said I think that the silver  cell may be trapping a lot of fluid up underneath the edge of this and I think if we were to switch something out to say Hydrofera Blue this may actually do better. I discussed that with him today. 02-17-2023 upon evaluation today patient appears to be doing well currently in regard to his wounds. He has been tolerating the dressing changes I did have to perform some debridement today wound actually looks significantly better I am actually very pleased with where we stand I do believe that he is making excellent headway towards closure. 02-24-2023 upon evaluation today patient appears to be doing well currently in regard to his wound which is showing some signs of improvement which is excellent news. Fortunately I do not see any signs of active infection looking or systemically which is great news as well. No fevers, chills, nausea, vomiting, or diarrhea. We have gotten approval for TheraSkin but there was some issues with the provider in network status. Specifically with integrated wound specialist. Apparently were in network in a couple locations but I got [place] to check it but not with the one that we bill under. Nonetheless I think that this is something that can be fixed and needs to be fixed ASAP. 03-10-2023 upon evaluation today patient's wound is actually showing signs of excellent improvement and looks to be doing much better. Fortunately I do not see any signs of active infection at this time which is great news that he has a little bit of irritation around the edges of the wound. I am going to recommend  that we go ahead and work on diet in particular as far as cream is concerned I recommend topical mixup triamcinolone and nystatin to see if we help with some of the itching and irritation there. Otherwise the wound itself appears to be doing really well. 03-17-2023 upon evaluation  today patient appears to be doing well currently in regard to his wounds. He is actually tolerating the dressing changes without complication. There is gena be some need here for sharp debridement and in general I do believe that he is making good headway towards closure. 03-23-2023 upon evaluation today patient appears to be doing excellent in regard to his wound. He has been tolerating the dressing changes without complication. Fortunately I do not see any signs of active infection at this time. 12/31; patient has 4 small wounds on the right lateral ankle in the setting of chronic venous insufficiency. He is back at work. He is apparently on a waiting list for a venous ablation type procedure. I remember him from earlier in the year at that time he had really significant surrounding inflammation/infection. That is completely a bit abated now 04-07-23 upon evaluation today patient appears to be doing excellent in regard to his wounds. He has been tolerating the dressing changes without complication. Fortunately I do not see any evidence of worsening and I do believe that the patient is making excellent headway here towards closure. Electronic Signature(s) Signed: 04/07/2023 9:00:42 AM By: [name] Ferraris PA-C Entered By: [name] Ferraris on 04/07/2023 06:00:42 -------------------------------------------------------------------------------- Physical Exam Details Patient Name: Date of Service: [name], [name] 04/07/2023 7:45 A M Medical Record Number: [id number] Patient Account Number: [id number] Date of Birth/Sex: Treating RN: 01-Dec-1955 (68 y.o. [name] Primary Care Provider: Edman [name] Other Clinician: Referring Provider: Treating Provider/Extender: Luz, Burcher [place] ([id number]) [id number] Page 4 of 9 Weeks in Treatment: 20 Constitutional Well-nourished and well-hydrated in no acute distress. Respiratory normal breathing  without difficulty. Psychiatric this patient is able to make decisions and demonstrates good insight into disease process. Alert and Oriented x 3. pleasant and cooperative. Notes Upon inspection patient's wound bed actually showed signs of good granulation epithelization at this point. Fortunately I do not see any signs of active infection which is great news and in general I do believe that the patient is making really good headway here towards closure. Electronic Signature(s) Signed: 04/07/2023 9:00:57 AM By: [name] Ferraris PA-C Entered By: [name] Ferraris on 04/07/2023 06:00:57 -------------------------------------------------------------------------------- Physician Orders Details Patient Name: Date of Service: [name], [name] 04/07/2023 7:45 A M Medical Record Number: [id number] Patient Account Number: [id number] Date of Birth/Sex: Treating RN: 09/28/1955 (68 y.o. 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 Nurse Visit as needed Bathing/ Shower/ Hygiene May shower with wound dressing protected with water repellent cover or cast protector. No tub bath. Anesthetic (Use 'Patient Medications' Section for Anesthetic Order Entry) Lidocaine applied to wound bed Wound Treatment Wound #1 - Lower Leg Wound Laterality: Right, Lateral Cleanser: Vashe 5.8 (oz) Every Other  Day/30  Days Discharge Instructions: Use vashe 5.8 (oz) as directed Topical: Triamcinolone Acetonide Cream, 0.1%, 15 (g) tube Every Other Day/30 Days Discharge Instructions: Apply as directed by provider. Prim Dressing: Promogran Matrix 4.34 (in) Every Other Day/30 Days ary Discharge Instructions: Moisten w/normal saline or sterile water; Cover wound as directed. Do not remove from wound bed. [name], [name] ([id number]) [id number] Page 5 of 9 Prim Dressing: Xeroform-HBD 2x2 (in/in) Every Other Day/30 Days ary Discharge Instructions: Apply Xeroform-HBD 2x2 (in/in) as directed Secondary Dressing: ABD Pad 5x9 (in/in) (Generic) Every Other Day/30 Days Discharge Instructions: Cover with ABD pad Compression Wrap: Urgo K2, two layer compression system, regular Every Other Day/30 Days Electronic Signature(s) Signed: 04/07/2023 5:32:36 PM By: [name] Blossom MSN RN CNS WTA Signed: 04/07/2023 6:16:28 PM By: [name] Ferraris PA-C Entered By: [name] on 04/07/2023 05:40:57 -------------------------------------------------------------------------------- Problem List Details Patient Name: Date of Service: [name], Allen M. 04/07/2023 7:45 A M Medical Record Number: [id number] Patient Account Number: [id number] Date of Birth/Sex: Treating RN: 11-Nov-1955 (68 y.o. 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 04/07/2023 7:45 A M Medical Record Number: [id number] Patient Account Number: [id number] Date of Birth/Sex: Treating RN: 02-18-1956 (68 y.o. [name] Primary Care Provider: Edman [name] Other Clinician: Referring Provider: Treating Provider/Extender: [name] Ferraris [name] Devra in Treatment: 20 Subjective Chief Complaint Information obtained from Patient Right lower extremity ulcer History of Present Illness (HPI) 11/18/2022 Mr. Trev Boley is a 68 year old male with a past medical history of nerve conduction deafness that presents to the clinic for a 7 month history of nonhealing ulcers to the right lower extremity. He states these happened spontaneously and have progressively gotten worse. The more proximal wound has been present for 7 months and the most recent wound on the top of the foot/distal leg has been present for the past month. He has 5 wounds total. He visited the ED on 11/04/2022 for this issue. He was given cephalexin  for which he completed. He has been using Several ointments at different times to address this issue. These ointments include Silvadene , mupirocin  and clobetasol .. At times he keeps the wounds open to air. He also states he will go in the pool with the wounds exposed. For work he is on his feet for long periods of time. He does not wear compression stockings. ABIs in office were 0.8. He currently denies signs of infection. 8/28; patient presents for follow-up. He has been using Vashe wet-to-dry  dressings to the wound beds. Patient's wound culture showed Pseudomonas aeruginosa sensitive to ciprofloxacin and this was sent into the pharmacy. Also recommended Keystone antibiotic ointment And this has already been ordered. He has not received this yet. He had a skin biopsy done  as well that showed fibrosis inflammation. This was nonspecific. Patient declined debridement due to chronic pain. 9/4; patient presents for follow-up. He has been using Vashe wet-to-dry dressings to the wound beds. He obtains the [facility] antibiotic ointment tomorrow. He has 1 day left of his oral antibiotics. There has been improvement in size since last clinic visit to the wound beds. He currently denies systemic signs of infection. 9/11; patient presents for follow-up. He has been using Keystone antibiotic ointment to the wound beds. He has tolerated this treatment well. Wound measurements are stable however periwound appears less irritated. 9/18; patient using Keystone and the ABD pad and Tubigrip. Unfortunately the Keystone cream coalesced over the surface of the wounds requiring an aggressive debridement to remove it. Under the [employer] [facility] [employer] the surface of the wound was not viable requiring further aggressive debridement. The patient has 4 wounds on the right lateral leg which are venous in etiology and a clustered localized area 9/25; this patient has a difficult cluster of wounds on the right lateral lower leg and ankle. Very painful. We have been using Keystone, Hydrofera Blue and Tubigrip compression. His ABI is 0.8 in this clinic. As far as I can see he is not seen vein and vascular for reflux studies. A biopsy was done in our clinic that just showed scar tissue no other identifiable pathology. 10/2; this patient has a cluster of wounds on the right lateral lower leg and ankle. He has surrounding stasis dermatitis no doubt chronic venous insufficiency with hemosiderin deposition. I have also been concerned about  the difficulty feeling his posterior tibial and dorsalis pedis pulses although his ABI in the clinic was 0.8. He definitely has arterial studies on Thursday, I thought he might have venous reflux studies at the same time if not he is going to need these [name]. Likely will need to see vascular surgery as well. We have been using Keystone Hydrofera Blue ABDs and Tubigrip x 2 10/9 the patient is gone for his vascular testing. Fortunately the arterial studies were fairly normal although his ABI was noncompressible at 1.32 on the right is TBI at 0.92 is normal. Triphasic and biphasic waveforms. On the left ABI was normal triphasic waveforms and normal TBI in terms of the venous reflux studies surprisingly benign. He had no evidence of a DVT no superficial throat thrombosis. He did have venous reflux noted in the right greater saphenous vein in the calf although this is not the drainage area where his current wounds are on the right lateral calf. There was no reflux in the small saphenous vein. We are using Sorbact and hydrogel as the primary dressing also using Keystone. 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 Wounds are better this week and he tolerated the Urgo K2 compression well set it was comfortable and had no issues with it. Wound surfaces are better and there is less surrounding erythema around this cluster of difficult wounds on the right lateral ankle and lower leg 10/23; appointment with the vascular surgery on November 5. Question is would he benefit from a greater saphenous vein ablation with regards to helping these wounds heal. I was hoping for a better outcome today however he has a yellowish gritty surface requiring debridement. We have been using Sorbact hydrogel. He was  denied for Apligraf we will put TheraSkin through his [employer] based charles schwab. Not sure if this is a class the mile or not. He has not had any pain and says things feel better. He is tolerating the full 4-layer equivalent compression 10/30; appointment with vascular surgery on November 5. He comes in today with a fair amount of drainage here. He had 4 wounds in this area with significant surrounding stasis dermatitis they have coalesced now into 3 wounds. We have been using Sorbact and hydrogel. He was denied for Apligraf and we are still waiting to hear about TheraSkin although this may be a class the denile. He seemed to be doing well at the beginning of the month after putting TCA in the 4- layer compression on him however he now seems to have plateaued. 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 His wife is asking about changing this at home again when we were using [employer]. His arterial studies were previously reasonably unremarkable. 02-03-2023 upon evaluation today patient appears to be doing pretty well currently in regard to his wound. He actually did see vascular yesterday and they [name], [name] ([id number]) [id number] Page 7 of 9 actually discussed a venous ablation surgery for him. I do think that this will be appropriate and I think it would help him as well. Fortunately I do not see any signs of active infection locally or systemically which is good news. There is some need for some sharp debridement but in general he seems to be making good progress here. 02-10-2023 upon evaluation today patient's wound does show signs of quite a bit of drainage but I do not see obvious signs of infection at this time I discussed that with him today. With that being said I think that the silver  cell may be trapping a lot of fluid up underneath the edge of this and I think if we were to switch something out to say Hydrofera Blue this may  actually do better. I discussed that with him today. 02-17-2023 upon evaluation today patient appears to be doing well currently in regard to his wounds. He has been tolerating the dressing changes I did have to perform some debridement today wound actually looks significantly better I am actually very pleased with where we stand I do believe that he is making excellent headway towards closure. 02-24-2023 upon evaluation today patient appears to be doing well currently in regard to his wound which is showing some signs of improvement which is excellent news. Fortunately I do not see any signs of active infection looking or systemically which is great news as well. No fevers, chills, nausea, vomiting, or diarrhea. We have gotten approval for [employer] but there was some issues with the provider in network status. Specifically with integrated wound specialist. Apparently were in network in a couple locations but I got [facility] to check it but not with the one that we bill under. Nonetheless I think that this is something that can be fixed and needs to be fixed ASAP. 03-10-2023 upon evaluation today patient's wound is actually showing signs of excellent improvement and looks to be doing much better. Fortunately I do not see any signs of active infection at this time which is great news that he has a little bit of irritation around the edges of the wound. I am going to recommend that we go ahead and work on diet in particular as far as cream is concerned I recommend topical  mixup triamcinolone and nystatin to see if we help with some of the itching and irritation there. Otherwise the wound itself appears to be doing really well. 03-17-2023 upon evaluation today patient appears to be doing well currently in regard to his wounds. He is actually tolerating the dressing changes without complication. There is gena be some need here for sharp debridement and in general I do believe that he is making good headway  towards closure. 03-23-2023 upon evaluation today patient appears to be doing excellent in regard to his wound. He has been tolerating the dressing changes without complication. Fortunately I do not see any signs of active infection at this time. 12/31; patient has 4 small wounds on the right lateral ankle in the setting of chronic venous insufficiency. He is back at work. He is apparently on a waiting list for a venous ablation type procedure. I remember him from earlier in the year at that time he had really significant surrounding inflammation/infection. That is completely a bit abated now 04-07-23 upon evaluation today patient appears to be doing excellent in regard to his wounds. He has been tolerating the dressing changes without complication. Fortunately I do not see any evidence of worsening and I do believe that the patient is making excellent headway here towards closure. Objective Constitutional Well-nourished and well-hydrated in no acute distress. Vitals Time Taken: 7:50 AM, Height: 67 in, Weight: 174 lbs, BMI: 27.2, Temperature: 97.8 F, Pulse: 66 bpm, Respiratory Rate: 18 breaths/min, Blood Pressure: 135/82 mmHg. Respiratory normal breathing without difficulty. Psychiatric this patient is able to make decisions and demonstrates good insight into disease process. Alert and Oriented x 3. pleasant and cooperative. General Notes: Upon inspection patient's wound bed actually showed signs of good granulation epithelization at this point. Fortunately I do not see any signs of active infection which is great news and in general I do believe that the patient is making really good headway here towards closure. Integumentary (Hair, Skin) Wound #1 status is Open. Original cause of wound was Gradually Appeared. The date acquired was: 08/29/2022. The wound has been in treatment 20 weeks. The wound is located on the Right,Lateral Lower Leg. The wound measures 7.5cm length x 3.5cm width x 0.1cm depth;  20.617cm^2 area and 2.062cm^3 volume. There is Fat Layer (Subcutaneous Tissue) exposed. There is a medium amount of serosanguineous drainage noted. There is no granulation within the wound bed. There is a large (67-100%) amount of necrotic tissue within the wound bed including Eschar. 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 There was a Excisional Skin/Subcutaneous Tissue Debridement with a total area of 20.61 sq cm performed by [name] Ferraris, PA-C. With the following instrument(s): Curette to remove Viable and Non-Viable tissue/material. Material removed includes Subcutaneous Tissue, Slough, and Biofilm after achieving pain control using Lidocaine 4% T opical Solution. No specimens were taken. A time out was conducted at 08:16, prior to the start of the procedure. A Minimum amount of bleeding was controlled with Pressure. The procedure was tolerated well with a pain level of 0 throughout and a pain level of 0 following the procedure. Post Debridement Measurements: 7.5cm length x 3.5cm width x 0.1cm depth; 2.062cm^3 volume. Character of  Wound/Ulcer Post Debridement is stable. Post procedure Diagnosis Wound #1: Same as Pre-Procedure Pre-procedure diagnosis of Wound #1 is an Atypical located on the Right,Lateral Lower Leg . There was a Four Layer Compression Therapy Procedure by [name] Blossom, RN. Post procedure Diagnosis Wound #1: Same as Pre-Procedure Plan Follow-up Appointments: Return Appointment in 1 week. Nurse Visit as needed Bathing/ Shower/ Hygiene: May shower with wound dressing  protected with water repellent cover or cast protector. No tub bath. Anesthetic (Use 'Patient Medications' Section for Anesthetic Order Entry): Lidocaine applied to wound bed WOUND #1: - Lower Leg Wound Laterality: Right, Lateral Cleanser: Vashe 5.8 (oz) Every Other Day/30 Days Discharge Instructions: Use vashe 5.8 (oz) as directed Topical: Triamcinolone Acetonide Cream, 0.1%, 15 (g) tube Every Other Day/30 Days Discharge Instructions: Apply as directed by provider. Prim Dressing: Promogran Matrix 4.34 (in) Every Other Day/30 Days ary Discharge Instructions: Moisten w/normal saline or sterile water; Cover wound as directed. Do not remove from wound bed. Prim Dressing: Xeroform-HBD 2x2 (in/in) Every Other Day/30 Days ary Discharge Instructions: Apply Xeroform-HBD 2x2 (in/in) as directed Secondary Dressing: ABD Pad 5x9 (in/in) (Generic) Every Other Day/30 Days Discharge Instructions: Cover with ABD pad Com pression Wrap: Urgo K2, two layer compression system, regular Every Other Day/30 Days 1. I am going to recommend that the patient should continue to monitor for any signs of infection or worsening. Based on what I see I believe that he will continue to go forward with the compression wrapping which is doing great. 2. I am also can recommend that we should switch over to using collagen I am going to put this over the main wound area as well as the satellite regions. With that being said over the satellite areas we will use a little Xeroform over this to keep it from drying out. 3. I am also can recommend an ABD pad to cover and again were using the compression wrapping. We will see patient back for reevaluation in 1 week here in the clinic. If anything worsens or changes patient will contact our office for additional recommendations. Electronic Signature(s) Signed: 04/07/2023 9:01:36 AM By: [name] Ferraris PA-C Entered By: [name] Ferraris on 04/07/2023  06:01:36 -------------------------------------------------------------------------------- SuperBill Details Patient Name: Date of Service: [name], Turrell M. 04/07/2023 Medical Record Number: [id number] Patient Account Number: [id number] Date of Birth/Sex: Treating RN: [birth year] (68 y.o. [name] Primary Care Provider: Edman [name] Other Clinician: Referring Provider: Treating Provider/Extender: [name] Ferraris [name] Devra in Treatment: [address] [place] M ([id number]) [id number] Page 9 of 9 Diagnosis Coding ICD-10 Codes Code Description I87.311 Chronic venous hypertension (idiopathic) with ulcer of right lower extremity L97.818 Non-pressure chronic ulcer of other part of right lower leg with other specified severity 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, unspecified Facility Procedures : CPT4 Code: 63899987 Description: 11042 - DEB SUBQ TISSUE 20 SQ CM/< ICD-10 Diagnosis Description L97.818 Non-pressure chronic ulcer of other part of right lower leg with other specified Modifier: severity Quantity: 1 : CPT4 Code: 63899981 Description: 11045 - DEB SUBQ TISS EA ADDL 20CM ICD-10 Diagnosis Description L97.818 Non-pressure chronic ulcer of other part of right lower leg with other specified Modifier: severity Quantity: 1 Physician Procedures : CPT4 Code Description Modifier 11042 11042 - WC PHYS SUBQ TISS 20 SQ CM ICD-10 Diagnosis Description L97.818 Non-pressure chronic ulcer of other part of right lower leg with other specified severity Quantity: 1 : 3229823 11045 - WC PHYS SUBQ TISS EA ADDL 20 CM ICD-10 Diagnosis Description L97.818 Non-pressure chronic  ulcer of other part of right lower leg with other specified severity Quantity: 1 Electronic Signature(s) Signed: 04/07/2023 9:02:46 AM By: [name] Ferraris PA-C Entered By: [name] Ferraris on 04/07/2023 06:02:46

## 2023-04-08 NOTE — Progress Notes (Signed)
 KARLIS, CREGG ([id number]) [id number] Page 1 of 9 Visit Report for 04/07/2023 Arrival Information Details Patient Name: Date of Service: [name], [name]. 04/07/2023 7:45 A M Medical Record Number: [id number] Patient Account Number: [id number] Date of Birth/Sex: Treating RN: [birth year] (68 y.o. NETTY Claudene Blossom Primary Care Marques Ericson: Edman Blunt Other Clinician: Referring Rhetta Cleek: Treating Walaa Carel/Extender: Bethena Andre Edman Blunt Devra in Treatment: 20 Visit Information History Since Last Visit Added or deleted any medications: No Patient Arrived: Ambulatory Any new allergies or adverse reactions: No Arrival Time: 07:43 Had a fall or experienced change in No Accompanied By: wife activities of daily living that may affect Transfer Assistance: None risk of falls: Patient Identification Verified: Yes Signs or symptoms of abuse/neglect since last visito No Secondary Verification Process Completed: Yes Hospitalized since last visit: No Patient Requires Transmission-Based Precautions: No Has Dressing in Place as Prescribed: Yes Patient Has Alerts: Yes Has Compression in Place as Prescribed: Yes Patient Alerts: R ABI 1.32 L ABI 1.24 Pain Present Now: No R TBI .92 L TBI 1.05 Electronic Signature(s) Signed: 04/07/2023 5:32:36 PM By: Claudene Blossom MSN RN CNS WTA Entered By: Claudene Blossom on 04/07/2023 07:46:49 -------------------------------------------------------------------------------- Clinic Level of Care Assessment Details Patient Name: Date of Service: [name], Akim M. 04/07/2023 7:45 A M Medical Record Number: [id number] Patient Account Number: [id number] Date of Birth/Sex: Treating RN: [birth year] (68 y.o. NETTY Claudene Blossom Primary Care Meiko Stranahan: Edman Blunt Other Clinician: Referring Akaysha Cobern: Treating Arriana Lohmann/Extender: Bethena Andre Edman Blunt Devra in Treatment: 20 Clinic Level of Care Assessment Items TOOL 1  Quantity Score []  - 0 Use when EandM and Procedure is performed on INITIAL visit ASSESSMENTS - Nursing Assessment / Reassessment []  - 0 General Physical Exam (combine w/ comprehensive assessment (listed just below) when performed on new pt. evals) []  - 0 Comprehensive Assessment (HX, ROS, Risk Assessments, Wounds Hx, etc.) ASSESSMENTS - Wound and Skin Assessment / Reassessment []  - 0 Dermatologic / Skin Assessment (not related to wound area) Hergert, CREED HERO ([id number]) [id number] Page 2 of 9 ASSESSMENTS - Ostomy and/or Continence Assessment and Care []  - 0 Incontinence Assessment and Management []  - 0 Ostomy Care Assessment and Management (repouching, etc.) PROCESS - Coordination of Care []  - 0 Simple Patient / Family Education for ongoing care []  - 0 Complex (extensive) Patient / Family Education for ongoing care []  - 0 Staff obtains Chiropractor, Records, T Results / Process Orders est []  - 0 Staff telephones HHA, Nursing Homes / Clarify orders / etc []  - 0 Routine Transfer to another Facility (non-emergent condition) []  - 0 Routine Hospital Admission (non-emergent condition) []  - 0 New Admissions / Manufacturing Engineer / Ordering NPWT Apligraf, etc. , []  - 0 Emergency Hospital Admission (emergent condition) PROCESS - Special Needs []  - 0 Pediatric / Minor Patient Management []  - 0 Isolation Patient Management []  - 0 Hearing / Language / Visual special needs []  - 0 Assessment of Community assistance (transportation, D/C planning, etc.) []  - 0 Additional assistance / Altered mentation []  - 0 Support Surface(s) Assessment (bed, cushion, seat, etc.) INTERVENTIONS - Miscellaneous []  - 0 External ear exam []  - 0 Patient Transfer (multiple staff / Nurse, Adult / Similar devices) []  - 0 Simple Staple / Suture removal (25 or less) []  - 0 Complex Staple / Suture removal (26 or more) []  - 0 Hypo/Hyperglycemic Management (do not check if billed  separately) []  - 0 Ankle / Brachial Index (ABI) - do not check if billed separately Has the patient  been seen at the hospital within the last three years: Yes Total Score: 0 Level Of Care: ____ Electronic Signature(s) Signed: 04/07/2023 5:32:36 PM By: Claudene Blossom MSN RN CNS WTA Entered By: Claudene Blossom on 04/07/2023 08:42:17 -------------------------------------------------------------------------------- Compression Therapy Details Patient Name: Date of Service: [name], Tiant M. 04/07/2023 7:45 A M Medical Record Number: [id number] Patient Account Number: [id number] Date of Birth/Sex: Treating RN: 10-18-1955 (68 y.o. NETTY Claudene Blossom Primary Care Damonie Ellenwood: Edman Blunt Other Clinician: Referring Yenesis Even: Treating Brinda Focht/Extender: Bethena Andre Edman Blunt Devra in Treatment: 20 Compression Therapy Performed for Wound Assessment: Wound #1 Right,Lateral Lower Leg Performed By: Lindi Claudene Blossom, RN Clugston, CREED HERO ([id number]) [id number] Page 3 of 9 Compression Type: Four Layer Post Procedure Diagnosis Same as Pre-procedure Electronic Signature(s) Signed: 04/07/2023 5:32:36 PM By: Claudene Blossom MSN RN CNS WTA Entered By: Claudene Blossom on 04/07/2023 08:17:20 -------------------------------------------------------------------------------- Encounter Discharge Information Details Patient Name: Date of Service: [name], Brenn M. 04/07/2023 7:45 A M Medical Record Number: [id number] Patient Account Number: [id number] Date of Birth/Sex: Treating RN: September 05, 1955 (68 y.o. NETTY Claudene Blossom Primary Care Miguelina Fore: Edman Blunt Other Clinician: Referring Glorious Flicker: Treating Davonne Jarnigan/Extender: Bethena Andre Edman Blunt Devra in Treatment: 20 Encounter Discharge Information Items Post Procedure Vitals Discharge Condition: Stable Temperature (F): 97.8 Ambulatory Status: Ambulatory Pulse (bpm): 66 Discharge Destination: Home Respiratory  Rate (breaths/min): 18 Transportation: Private Auto Blood Pressure (mmHg): 135/82 Accompanied By: wife Schedule Follow-up Appointment: Yes Clinical Summary of Care: Electronic Signature(s) Signed: 04/07/2023 5:32:36 PM By: Claudene Blossom MSN RN CNS WTA Entered By: Claudene Blossom on 04/07/2023 08:43:50 -------------------------------------------------------------------------------- Lower Extremity Assessment Details Patient Name: Date of Service: [name], Deloss M. 04/07/2023 7:45 A M Medical Record Number: [id number] Patient Account Number: [id number] Date of Birth/Sex: Treating RN: [birth year] (68 y.o. NETTY Claudene Blossom Primary Care Bjorn Hallas: Edman Blunt Other Clinician: Referring Jlynn Ly: Treating Monroe Toure/Extender: Bethena Andre Edman Blunt Devra in Treatment: 20 Edema Assessment Assessed: Colletta: No] Glenis: Yes] Edema: [Left: N] [Right: o] V[Left: ASS, CREED HERO ([id number])] [Right: [id number] Page 4 of 9] Calf Left: Right: Point of Measurement: 32 cm From Medial Instep 34 cm Ankle Left: Right: Point of Measurement: 11 cm From Medial Instep 22 cm Vascular Assessment Pulses: Dorsalis Pedis Palpable: [Right:Yes] Extremity colors, hair growth, and conditions: Extremity Color: [Right:Hyperpigmented] Hair Growth on Extremity: [Right:Yes] Temperature of Extremity: [Right:Warm] Capillary Refill: [Right:< 3 seconds] Dependent Rubor: [Right:No] Blanched when Elevated: [Right:No No] Toe Nail Assessment Left: Right: Thick: No Discolored: No Deformed: No Improper Length and Hygiene: Yes Electronic Signature(s) Signed: 04/07/2023 5:32:36 PM By: Claudene Blossom MSN RN CNS WTA Entered By: Claudene Blossom on 04/07/2023 08:07:01 -------------------------------------------------------------------------------- Multi Wound Chart Details Patient Name: Date of Service: [name], Silus M. 04/07/2023 7:45 A M Medical Record Number: [id number] Patient Account  Number: [id number] Date of Birth/Sex: Treating RN: 1955/04/26 (68 y.o. NETTY Claudene Blossom Primary Care Shanaia Sievers: Edman Blunt Other Clinician: Referring Kamori Kitchens: Treating Daune Divirgilio/Extender: Bethena Andre Edman Blunt Devra in Treatment: 20 Vital Signs Height(in): 67 Pulse(bpm): 66 Weight(lbs): 174 Blood Pressure(mmHg): 135/82 Body Mass Index(BMI): 27.2 Temperature(F): 97.8 Respiratory Rate(breaths/min): 18 [1:Photos:] [N/A:N/A] Right, Lateral Lower Leg N/A N/A Wound Location: Gradually Appeared N/A N/A Wounding Event: Atypical N/A N/A Primary Etiology: Asthma, Hypertension N/A N/A Comorbid History: 08/29/2022 N/A N/A Date Acquired: 20 N/A N/A Weeks of Treatment: Open N/A N/A Wound Status: No N/A N/A Wound Recurrence: Yes N/A N/A Clustered Wound: 7.5x3.5x0.1 N/A N/A Measurements L x W x D (cm) 20.617 N/A N/A A (cm) : rea  2.062 N/A N/A Volume (cm) : 78.30% N/A N/A % Reduction in A rea: 94.60% N/A N/A % Reduction in Volume: Full Thickness Without Exposed N/A N/A Classification: Support Structures Medium N/A N/A Exudate A mount: Serosanguineous N/A N/A Exudate Type: red, brown N/A N/A Exudate Color: None Present (0%) N/A N/A Granulation Amount: Large (67-100%) N/A N/A Necrotic Amount: Eschar N/A N/A Necrotic Tissue: Fat Layer (Subcutaneous Tissue): Yes N/A N/A Exposed Structures: Fascia: No Tendon: No Muscle: No Joint: No Bone: No None N/A N/A Epithelialization: Treatment Notes Electronic Signature(s) Signed: 04/07/2023 5:32:36 PM By: Claudene Blossom MSN RN CNS WTA Entered By: Claudene Blossom on 04/07/2023 08:11:47 -------------------------------------------------------------------------------- Multi-Disciplinary Care Plan Details Patient Name: Date of Service: [name], Bracy M. 04/07/2023 7:45 A M Medical Record Number: [id number] Patient Account Number: [id number] Date of Birth/Sex: Treating RN: 13-Jul-1955 (68 y.o. NETTY Claudene Blossom Primary  Care Denelle Capurro: Edman Blunt Other Clinician: Referring Jahmiyah Dullea: Treating Mihika Surrette/Extender: Bethena Andre Edman Blunt Devra in Treatment: 20 Active Inactive Wound/Skin Impairment Nursing Diagnoses: Knowledge deficit related to ulceration/compromised skin integrity Goals: Patient/caregiver will verbalize understanding of skin care regimen Date Initiated: 11/18/2022 Date Inactivated: 12/30/2022 Target Resolution Date: 12/19/2022 Goal Status: Met Ulcer/skin breakdown will have a volume reduction of 30% by week 4 Date Initiated: 11/18/2022 Date Inactivated: 12/30/2022 Target Resolution Date: 12/19/2022 Goal Status: Met Ulcer/skin breakdown will have a volume reduction of 50% by week 8 Date Initiated: 11/18/2022 Date Inactivated: 01/13/2023 Target Resolution Date: 01/18/2023 TYWONE, BEMBENEK ([id number]) [id number].pdf Page 6 of 9 Goal Status: Met Ulcer/skin breakdown will have a volume reduction of 80% by week 12 Date Initiated: 11/18/2022 Date Inactivated: 02/17/2023 Target Resolution Date: 02/18/2023 Goal Status: Met Ulcer/skin breakdown will heal within 14 weeks Date Initiated: 11/18/2022 Target Resolution Date: 04/20/2023 Goal Status: Active Interventions: Assess patient/caregiver ability to obtain necessary supplies Assess patient/caregiver ability to perform ulcer/skin care regimen upon admission and as needed Assess ulceration(s) every visit Notes: Electronic Signature(s) Signed: 04/07/2023 5:32:36 PM By: Claudene Blossom MSN RN CNS WTA Entered By: Claudene Blossom on 04/07/2023 08:12:52 -------------------------------------------------------------------------------- Pain Assessment Details Patient Name: Date of Service: [name], Tyler M. 04/07/2023 7:45 A M Medical Record Number: [id number] Patient Account Number: [id number] Date of Birth/Sex: Treating RN: 18-Feb-1956 (68 y.o. NETTY Claudene Blossom Primary Care Tailynn Armetta: Edman Blunt Other  Clinician: Referring Ines Warf: Treating Najma Bozarth/Extender: Bethena Andre Edman Blunt Devra in Treatment: 20 Active Problems Location of Pain Severity and Description of Pain Patient Has Paino No Site Locations Pain Management and Medication Current Pain Management: Electronic Signature(s) Signed: 04/07/2023 5:32:36 PM By: Claudene Blossom MSN RN CNS WTA Entered By: Claudene Blossom on 04/07/2023 07:50:42 Peth, CREED HERO ([id number]) [id number] Page 7 of 9 -------------------------------------------------------------------------------- Patient/Caregiver Education Details Patient Name: Date of Service: GALVIN, AVERSA 1/8/2025andnbsp7:45 A M Medical Record Number: [id number] Patient Account Number: [id number] Date of Birth/Gender: Treating RN: 08/23/1955 (68 y.o. NETTY Claudene Blossom Primary Care Physician: Edman Blunt Other Clinician: Referring Physician: Treating Physician/Extender: Bethena Andre Edman Blunt Devra in Treatment: 20 Education Assessment Education Provided To: Patient Education Topics Provided Wound/Skin Impairment: Handouts: Caring for Your Ulcer Methods: Explain/Verbal Responses: State content correctly Electronic Signature(s) Signed: 04/07/2023 5:32:36 PM By: Claudene Blossom MSN RN CNS WTA Entered By: Claudene Blossom on 04/07/2023 08:13:02 -------------------------------------------------------------------------------- Wound Assessment Details Patient Name: Date of Service: [name], Alonza M. 04/07/2023 7:45 A M Medical Record Number: [id number] Patient Account Number: [id number] Date of Birth/Sex: Treating RN: 11-20-1955 (68 y.o. NETTY Claudene Blossom Primary Care Karolyn Messing: Edman Blunt Other Clinician:  Referring Vallerie Hentz: Treating Timberlynn Kizziah/Extender: Bethena Andre Edman Marsa Devra in Treatment: 20 Wound Status Wound Number: 1 Primary Etiology: Atypical Wound Location: Right, Lateral Lower Leg Wound Status:  Open Wounding Event: Gradually Appeared Comorbid History: Asthma, Hypertension Date Acquired: 08/29/2022 Weeks Of Treatment: 20 Clustered Wound: Yes Photos DRAY, DENTE ([id number]) [id number] Page 8 of 9 Wound Measurements Length: (cm) 7.5 Width: (cm) 3.5 Depth: (cm) 0.1 Area: (cm) 20.617 Volume: (cm) 2.062 % Reduction in Area: 78.3% % Reduction in Volume: 94.6% Epithelialization: None Wound Description Classification: Full Thickness Without Exposed Suppor Exudate Amount: Medium Exudate Type: Serosanguineous Exudate Color: red, brown t Structures Foul Odor After Cleansing: No Slough/Fibrino Yes Wound Bed Granulation Amount: None Present (0%) Exposed Structure Necrotic Amount: Large (67-100%) Fascia Exposed: No Necrotic Quality: Eschar Fat Layer (Subcutaneous Tissue) Exposed: Yes Tendon Exposed: No Muscle Exposed: No Joint Exposed: No Bone Exposed: No Treatment Notes Wound #1 (Lower Leg) Wound Laterality: Right, Lateral Cleanser Vashe 5.8 (oz) Discharge Instruction: Use vashe 5.8 (oz) as directed Peri-Wound Care Topical Triamcinolone Acetonide Cream, 0.1%, 15 (g) tube Discharge Instruction: Apply as directed by Dalexa Gentz. Primary Dressing Promogran Matrix 4.34 (in) Discharge Instruction: Moisten w/normal saline or sterile water; Cover wound as directed. Do not remove from wound bed. Xeroform-HBD 2x2 (in/in) Discharge Instruction: Apply Xeroform-HBD 2x2 (in/in) as directed Secondary Dressing ABD Pad 5x9 (in/in) Discharge Instruction: Cover with ABD pad Secured With Compression Wrap Urgo K2, two layer compression system, regular Compression Stockings Add-Ons Electronic Signature(s) Signed: 04/07/2023 5:32:36 PM By: Claudene Blossom MSN RN CNS WTA Entered By: Claudene Blossom on 04/07/2023 08:05:38 Turnage, CREED HERO ([id number]) [id number] Page 9 of  9 -------------------------------------------------------------------------------- Vitals Details Patient Name: Date of Service: KENN, REKOWSKI. 04/07/2023 7:45 A M Medical Record Number: [id number] Patient Account Number: [id number] Date of Birth/Sex: Treating RN: 23-Oct-1955 (68 y.o. NETTY Claudene Blossom Primary Care Lestat Golob: Edman Marsa Other Clinician: Referring Gal Feldhaus: Treating Eshan Trupiano/Extender: Bethena Andre Edman Marsa Devra in Treatment: 20 Vital Signs Time Taken: 07:50 Temperature (F): 97.8 Height (in): 67 Pulse (bpm): 66 Weight (lbs): 174 Respiratory Rate (breaths/min): 18 Body Mass Index (BMI): 27.2 Blood Pressure (mmHg): 135/82 Reference Range: 80 - 120 mg / dl Electronic Signature(s) Signed: 04/07/2023 5:32:36 PM By: Claudene Blossom MSN RN CNS WTA Entered By: Claudene Blossom on 04/07/2023 07:50:34

## 2023-04-09 ENCOUNTER — Encounter (INDEPENDENT_AMBULATORY_CARE_PROVIDER_SITE_OTHER): Payer: Self-pay

## 2023-04-12 NOTE — Telephone Encounter (Signed)
 Could we please reach out to this patient to provide an update, thanks.

## 2023-04-14 ENCOUNTER — Encounter: Payer: BC Managed Care – PPO | Admitting: Physician Assistant

## 2023-04-14 DIAGNOSIS — H908 Mixed conductive and sensorineural hearing loss, unspecified: Secondary | ICD-10-CM | POA: Diagnosis not present

## 2023-04-14 DIAGNOSIS — I10 Essential (primary) hypertension: Secondary | ICD-10-CM | POA: Diagnosis not present

## 2023-04-14 DIAGNOSIS — L97812 Non-pressure chronic ulcer of other part of right lower leg with fat layer exposed: Secondary | ICD-10-CM | POA: Diagnosis not present

## 2023-04-14 DIAGNOSIS — I87311 Chronic venous hypertension (idiopathic) with ulcer of right lower extremity: Secondary | ICD-10-CM | POA: Diagnosis not present

## 2023-04-14 DIAGNOSIS — L97818 Non-pressure chronic ulcer of other part of right lower leg with other specified severity: Secondary | ICD-10-CM | POA: Diagnosis not present

## 2023-04-14 DIAGNOSIS — J45909 Unspecified asthma, uncomplicated: Secondary | ICD-10-CM | POA: Diagnosis not present

## 2023-04-15 NOTE — Progress Notes (Signed)
NASH, RIDPATH ([id number]) [id number] Page 1 of 9 Visit Report for 04/14/2023 Chief Complaint Document Details Patient Name: Date of Service: [name], [name]. 04/14/2023 7:45 A M Medical Record Number: [id number] Patient Account Number: [id number] Date of Birth/Sex: Treating RN: 1955-08-25 (68 y.o. Barnett Abu, Leah Primary Care Provider: Saralyn Pilar Other Clinician: Referring Provider: Treating Provider/Extender: Tiburcio Pea in Treatment: 21 Information Obtained from: Patient Chief Complaint Right lower extremity ulcer Electronic Signature(s) Signed: 04/15/2023 6:05:15 PM By: Allen Derry PA-C Entered By: Allen Derry on 04/14/2023 07:56:00 -------------------------------------------------------------------------------- Debridement Details Patient Name: Date of Service: [name], Britt M. 04/14/2023 7:45 A M Medical Record Number: [id number] Patient Account Number: [id number] Date of Birth/Sex: Treating RN: 1955-06-29 (68 y.o. Barnett Abu, Leah Primary Care Provider: Saralyn Pilar Other Clinician: Referring Provider: Treating Provider/Extender: Tiburcio Pea in Treatment: 21 Debridement Performed for Assessment: Wound #1 Right,Lateral Lower Leg Performed By: Physician Allen Derry, PA-C Debridement Type: Debridement Level of Consciousness (Pre-procedure): Awake and Alert Pre-procedure Verification/Time Out Yes - 08:05 Taken: Start Time: 08:05 Pain Control: Lidocaine 2% Topical Gel Percent of Wound Bed Debrided: 100% T Area Debrided (cm): otal 3.14 Tissue and other material debrided: Subcutaneous, Skin: Dermis Level: Skin/Subcutaneous Tissue Debridement Description: Excisional Instrument: Curette Bleeding: Minimum Hemostasis Achieved: Pressure End Time: 08:06 Procedural Pain: 0 Post Procedural Pain: 0 Lieske, [name] ([id number]) [id number] Page 2 of  9 Response to Treatment: Procedure was tolerated well Level of Consciousness (Post- Awake and Alert procedure): Post Debridement Measurements of Total Wound Length: (cm) 4 Width: (cm) 1 Depth: (cm) 0.2 Volume: (cm) 0.628 Character of Wound/Ulcer Post Debridement: Stable Post Procedure Diagnosis Same as Pre-procedure Electronic Signature(s) Signed: 04/14/2023 4:06:08 PM By: Bonnell Public Signed: 04/15/2023 6:05:15 PM By: Allen Derry PA-C Entered By: Bonnell Public on 04/14/2023 11:04:05 -------------------------------------------------------------------------------- HPI Details Patient Name: Date of Service: [name], Laine M. 04/14/2023 7:45 A M Medical Record Number: [id number] Patient Account Number: [id number] Date of Birth/Sex: Treating RN: June 04, 1955 (68 y.o. Barnett Abu, Leah Primary Care Provider: Saralyn Pilar Other Clinician: Referring Provider: Treating Provider/Extender: Tiburcio Pea in Treatment: 21 History of Present Illness HPI Description: 11/18/2022 [name] is a 68 year old male with a past medical history of nerve conduction deafness that presents to the clinic for a 7 month history of nonhealing ulcers to the right lower extremity. He states these happened spontaneously and have progressively gotten worse. The more proximal wound has been present for 7 months and the most recent wound on the top of the foot/distal leg has been present for the past month. He has 5 wounds total. He visited the ED on 11/04/2022 for this issue. He was given cephalexin for which he completed. He has been using Several ointments at different times to address this issue. These ointments include Silvadene, mupirocin and clobetasol.. At times he keeps the wounds open to air. He also states he will go in the pool with the wounds exposed. For work he is on his feet for long periods of time. He does not wear compression stockings. ABIs in office were 0.8. He  currently denies signs of infection. 8/28; patient presents for follow-up. He has been using Vashe wet-to-dry dressings to the wound beds. Patient's wound culture showed Pseudomonas aeruginosa sensitive to ciprofloxacin and this was sent into the pharmacy. Also recommended Keystone antibiotic ointment And this has already been ordered. He has not received this yet. He had a skin biopsy done as well  that showed fibrosis inflammation. This was nonspecific. Patient declined debridement due to chronic pain. 9/4; patient presents for follow-up. He has been using Vashe wet-to-dry dressings to the wound beds. He obtains the [facility] antibiotic ointment tomorrow. He has 1 day left of his oral antibiotics. There has been improvement in size since last clinic visit to the wound beds. He currently denies systemic signs of infection. 9/11; patient presents for follow-up. He has been using Keystone antibiotic ointment to the wound beds. He has tolerated this treatment well. Wound measurements are stable however periwound appears less irritated. 9/18; patient using Keystone and the ABD pad and Tubigrip. Unfortunately the Keystone cream coalesced over the surface of the wounds requiring an aggressive debridement to remove it. Under the [facility] the surface of the wound was not viable requiring further aggressive debridement. The patient has 4 wounds on the right lateral leg which are venous in etiology and a clustered localized area 9/25; this patient has a difficult cluster of wounds on the right lateral lower leg and ankle. Very painful. We have been using Keystone, Hydrofera Blue and Tubigrip compression. His ABI is 0.8 in this clinic. As far as I can see he is not seen vein and vascular for reflux studies. A biopsy was done in our clinic that just showed scar tissue no other identifiable pathology. 10/2; this patient has a cluster of wounds on the right lateral lower leg and ankle. He has surrounding stasis  dermatitis no doubt chronic venous insufficiency with hemosiderin deposition. I have also been concerned about the difficulty feeling his posterior tibial and dorsalis pedis pulses although his ABI in the clinic was 0.8. He definitely has arterial studies on Thursday, I thought he might have venous reflux studies at the same time if not he is going to need these [name]. Likely will need to see vascular surgery as well. We have been using [facility] ABDs and Tubigrip x 2 10/9 the patient is gone for his vascular testing. Fortunately the arterial studies were fairly normal although his ABI was noncompressible at 1.32 on the right is TBI at 0.92 is normal. Triphasic and biphasic waveforms. On the left ABI was normal triphasic waveforms and normal TBI in terms of the venous reflux studies JADIEL, FARB ([id number]) [id number] Page 3 of 9 surprisingly benign. He had no evidence of a DVT no superficial throat thrombosis. He did have venous reflux noted in the right greater saphenous vein in the calf although this is not the drainage area where his current wounds are on the right lateral calf. There was no reflux in the small saphenous vein. We are using Sorbact and hydrogel as the primary dressing also using Keystone. 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 Wounds are better this week and he tolerated the Urgo K2 compression well set it was comfortable and had no issues with it. Wound surfaces are better and there is less surrounding erythema around this cluster of difficult wounds on the right lateral ankle and lower leg 10/23; appointment with the vascular surgery on November 5. Question is would he benefit from a greater saphenous vein ablation with  regards to helping these wounds heal. I was hoping for a better outcome today however he has a yellowish gritty surface requiring debridement.  We have been using Sorbact hydrogel. He was denied for Apligraf we will put TheraSkin through his [employer] based [employer]. Not sure if this is a class the mile or not. He has not had any pain and says things feel better. He is tolerating the full 4-layer equivalent compression 10/30; appointment with vascular surgery on November 5. He comes in today with a fair amount of drainage here. He had 4 wounds in this area with significant surrounding stasis dermatitis they have coalesced now into 3 wounds. We have been using Sorbact and hydrogel. He was denied for Apligraf and we are still waiting to hear about TheraSkin although this may be a class the denile. He seemed to be doing well at the beginning of the month after putting TCA in the 4- layer compression on him however he now seems to have plateaued. 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 His wife is asking about changing this at home again when we were using Tubigrip's. His arterial studies were previously reasonably unremarkable. 02-03-2023 upon evaluation today patient appears to be doing pretty well currently in regard to his wound. He actually did see vascular yesterday and they actually discussed a venous ablation surgery for him. I do think that this will be appropriate and I think it would help him as well. Fortunately I do not see any signs of active infection locally or systemically which is good news. There is some need for some sharp debridement but in general he seems to be making good progress here. 02-10-2023 upon evaluation today patient's wound does show signs of quite a bit of drainage but I do not see obvious signs of infection at this time I discussed that with him today. With that being said I think that the silver cell may be trapping a lot of fluid  up underneath the edge of this and I think if we were to switch something out to say [facility] this may actually do better. I discussed that with him today. 02-17-2023 upon evaluation today patient appears to be doing well currently in regard to his wounds. He has been tolerating the dressing changes I did have to perform some debridement today wound actually looks significantly better I am actually very pleased with where we stand I do believe that he is making excellent headway towards closure. 02-24-2023 upon evaluation today patient appears to be doing well currently in regard to his wound which is showing some signs of improvement which is excellent news. Fortunately I do not see any signs of active infection looking or systemically which is great news as well. No fevers, chills, nausea, vomiting, or diarrhea. We have gotten approval for TheraSkin but there was some issues with the provider in network status. Specifically with integrated wound specialist. Apparently were in network in a couple locations but I got Velna Hatchet to check it but not with the one that we bill under. Nonetheless I think that this is something that can be fixed and needs to be fixed ASAP. 03-10-2023 upon evaluation today patient's wound is actually showing signs of excellent improvement and looks to be doing much better. Fortunately I do not see any signs of active infection at this time which is great news that he has a little bit of irritation around the edges of the wound. I am going to recommend that we go ahead and work on diet in particular as far as cream is concerned I recommend topical mixup  triamcinolone and nystatin to see if we help with some of the itching and irritation there. Otherwise the wound itself appears to be doing really well. 03-17-2023 upon evaluation today patient appears to be doing well currently in regard to his wounds. He is actually tolerating the dressing changes without complication.  There is gena be some need here for sharp debridement and in general I do believe that he is making good headway towards closure. 03-23-2023 upon evaluation today patient appears to be doing excellent in regard to his wound. He has been tolerating the dressing changes without complication. Fortunately I do not see any signs of active infection at this time. 12/31; patient has 4 small wounds on the right lateral ankle in the setting of chronic venous insufficiency. He is back at work. He is apparently on a waiting list for a venous ablation type procedure. I remember him from earlier in the year at that time he had really significant surrounding inflammation/infection. That is completely a bit abated now 04-07-23 upon evaluation today patient appears to be doing excellent in regard to his wounds. He has been tolerating the dressing changes without complication. Fortunately I do not see any evidence of worsening and I do believe that the patient is making excellent headway here towards closure. 04-14-23 upon evaluation today patient appears to be doing well currently in regard to his wound. He has been tolerating the dressing changes without complication. Fortunately I do not see any signs of infection his wound actually appears to be significantly improved which is great news. Electronic Signature(s) Signed: 04/14/2023 8:15:49 AM By: Allen Derry PA-C Entered By: Allen Derry on 04/14/2023 08:15:49 -------------------------------------------------------------------------------- Physical Exam Details Patient Name: Date of Service: [name], Twain M. 04/14/2023 7:45 A M Medical Record Number: [id number] Patient Account Number: [id number] Date of Birth/Sex: Treating RN: 09/09/1955 (68 y.o. Barnett Abu, Leah Primary Care Provider: Saralyn Pilar Other Clinician: Referring Provider: Treating Provider/Extender: Mikaeel, Berdan, [place] ([id number])  [id number] Page 4 of 9 Weeks in Treatment: 21 Constitutional Well-nourished and well-hydrated in no acute distress. Respiratory normal breathing without difficulty. Psychiatric this patient is able to make decisions and demonstrates good insight into disease process. Alert and Oriented x 3. pleasant and cooperative. Notes Upon inspection patient's wound bed did require sharp debridement clearway necrotic debris tolerated the debridement today without complication and postdebridement wound bed appears to be doing significantly better which is excellent. Electronic Signature(s) Signed: 04/14/2023 8:16:05 AM By: Allen Derry PA-C Entered By: Allen Derry on 04/14/2023 08:16:05 -------------------------------------------------------------------------------- Physician Orders Details Patient Name: Date of Service: [name], Gaelen M. 04/14/2023 7:45 A M Medical Record Number: [id number] Patient Account Number: [id number] Date of Birth/Sex: Treating RN: Apr 30, 1955 (68 y.o. 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 Nurse Visit as needed Bathing/ Shower/ Hygiene May shower with wound dressing protected with water repellent cover or cast protector. No tub bath. Anesthetic (Use 'Patient Medications' Section for Anesthetic Order Entry) Lidocaine applied to wound bed Wound Treatment Wound #1 - Lower Leg Wound Laterality: Right, Lateral Cleanser: Vashe 5.8  (oz) Every Other Day/30 Days Discharge  Instructions: Use vashe 5.8 (oz) as directed Topical: Triamcinolone Acetonide Cream, 0.1%, 15 (g) tube Every Other Day/30 Days Discharge Instructions: Apply as directed by provider. Prim Dressing: Promogran Matrix 4.34 (in) Every Other Day/30 Days ary Discharge Instructions: Moisten w/normal saline or sterile water; Cover wound as directed. Do not remove from wound bed. Prim Dressing: Xeroform-HBD 2x2 (in/in) ary Every Other Day/30 Days MARQUIES, MIMMS ([id number]) [id number] Page 5 of 9 Discharge Instructions: Apply Xeroform-HBD 2x2 (in/in) as directed Secondary Dressing: ABD Pad 5x9 (in/in) (Generic) Every Other Day/30 Days Discharge Instructions: Cover with ABD pad Compression Wrap: Urgo K2, two layer compression system, regular Every Other Day/30 Days Electronic Signature(s) Signed: 04/14/2023 4:06:08 PM By: Bonnell Public Signed: 04/15/2023 6:05:15 PM By: Allen Derry PA-C Entered By: Bonnell Public on 04/14/2023 11:04:21 -------------------------------------------------------------------------------- Problem List Details Patient Name: Date of Service: [name], Roby M. 04/14/2023 7:45 A M Medical Record Number: [id number] Patient Account Number: [id number] Date of Birth/Sex: Treating RN: 15-Sep-1955 (68 y.o. 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 04/14/2023 7:45 A M Medical Record Number: [id number] Patient Account Number: [id number] Date of Birth/Sex: Treating RN: [birth year] (68 y.o. Barnett Abu, Leah Primary Care Provider: Saralyn Pilar Other Clinician: Referring Provider: Treating Provider/Extender: Tiburcio Pea in Treatment: 21 Subjective Chief Complaint Information obtained from Patient Right lower extremity ulcer History of Present Illness (HPI) 11/18/2022 Mr. Adarius Orders is a 68 year old male with a past medical history of nerve conduction deafness that presents to the clinic for a 7 month history of nonhealing ulcers to the right lower extremity. He states these happened spontaneously and have progressively gotten worse. The more proximal wound has been present for 7 months and the most recent wound on the top of the foot/distal leg has been present for the past month. He has 5 wounds total. He visited the ED on 11/04/2022 for this issue. He was given cephalexin for which he completed. He has been using Several ointments at different times to address this issue. These ointments include Silvadene, mupirocin and clobetasol.. At times he keeps the wounds open to air. He also states he will go in the pool with the wounds exposed. For work he is on his feet for long periods of time. He does not wear compression stockings. ABIs in office were 0.8. He currently denies signs of infection. 8/28; patient presents for follow-up. He has been using Vashe wet-to-dry dressings to the wound beds. Patient's wound culture showed Pseudomonas aeruginosa sensitive to  ciprofloxacin and this was sent into the pharmacy. Also recommended Keystone antibiotic ointment And this has already been ordered. He has not received this yet. He had a skin biopsy done as well that showed fibrosis inflammation. This was nonspecific. Patient declined debridement due to chronic pain. 9/4; patient presents for follow-up. He has been using Vashe wet-to-dry dressings to  the wound beds. He obtains the [facility] antibiotic ointment tomorrow. He has 1 day left of his oral antibiotics. There has been improvement in size since last clinic visit to the wound beds. He currently denies systemic signs of infection. 9/11; patient presents for follow-up. He has been using Keystone antibiotic ointment to the wound beds. He has tolerated this treatment well. Wound measurements are stable however periwound appears less irritated. 9/18; patient using Keystone and the ABD pad and Tubigrip. Unfortunately the Keystone cream coalesced over the surface of the wounds requiring an aggressive debridement to remove it. Under the [facility] the surface of the wound was not viable requiring further aggressive debridement. The patient has 4 wounds on the right lateral leg which are venous in etiology and a clustered localized area 9/25; this patient has a difficult cluster of wounds on the right lateral lower leg and ankle. Very painful. We have been using Keystone, Hydrofera Blue and Tubigrip compression. His ABI is 0.8 in this clinic. As far as I can see he is not seen vein and vascular for reflux studies. A biopsy was done in our clinic that just showed scar tissue no other identifiable pathology. 10/2; this patient has a cluster of wounds on the right lateral lower leg and ankle. He has surrounding stasis dermatitis no doubt chronic venous insufficiency with hemosiderin deposition. I have also been concerned about the difficulty feeling his posterior tibial and dorsalis pedis pulses although his ABI in the  clinic was 0.8. He definitely has arterial studies on Thursday, I thought he might have venous reflux studies at the same time if not he is going to need these [name]. Likely will need to see vascular surgery as well. We have been using [facility] ABDs and Tubigrip x 2 10/9 the patient is gone for his vascular testing. Fortunately the arterial studies were fairly normal although his ABI was noncompressible at 1.32 on the right is TBI at 0.92 is normal. Triphasic and biphasic waveforms. On the left ABI was normal triphasic waveforms and normal TBI in terms of the venous reflux studies surprisingly benign. He had no evidence of a DVT no superficial throat thrombosis. He did have venous reflux noted in the right greater saphenous vein in the calf although this is not the drainage area where his current wounds are on the right lateral calf. There was no reflux in the small saphenous vein. We are using Sorbact and hydrogel as the primary dressing also using Keystone. 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 Wounds are better this week and he tolerated the Urgo K2 compression well set it was comfortable and had no issues with it. Wound surfaces are better and there is less surrounding erythema around this cluster of difficult wounds on the right lateral ankle and lower leg 10/23; appointment with the vascular surgery on November 5. Question is would he benefit from a greater saphenous vein ablation with regards to helping these wounds heal. I was hoping for a better outcome today however he has a yellowish gritty surface requiring debridement. We have been using Sorbact hydrogel. He was denied for Apligraf we will put TheraSkin through his [employer] based [employer]. Not  sure if this is a class the mile or not. He has not  had any pain and says things feel better. He is tolerating the full 4-layer equivalent compression 10/30; appointment with vascular surgery on November 5. He comes in today with a fair amount of drainage here. He had 4 wounds in this area with significant surrounding stasis dermatitis they have coalesced now into 3 wounds. We have been using Sorbact and hydrogel. He was denied for Apligraf and we are still waiting to hear about TheraSkin although this may be a class the denile. He seemed to be doing well at the beginning of the month after putting TCA in the 4- layer compression on him however he now seems to have plateaued. 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 His wife is asking about changing this at home again when we were using Tubigrip's. His arterial studies were previously reasonably unremarkable. 02-03-2023 upon evaluation today patient appears to be doing pretty well currently in regard to his wound. He actually did see vascular yesterday and they AVERY, MILLSTEIN ([id number]) [id number] Page 7 of 9 actually discussed a venous ablation surgery for him. I do think that this will be appropriate and I think it would help him as well. Fortunately I do not see any signs of active infection locally or systemically which is good news. There is some need for some sharp debridement but in general he seems to be making good progress here. 02-10-2023 upon evaluation today patient's wound does show signs of quite a bit of drainage but I do not see obvious signs of infection at this time I discussed that with him today. With that being said I think that the silver cell may be trapping a lot of fluid up underneath the edge of this and I think if we were to switch something out to say [facility] this may actually do better. I discussed that with him today. 02-17-2023 upon evaluation today patient  appears to be doing well currently in regard to his wounds. He has been tolerating the dressing changes I did have to perform some debridement today wound actually looks significantly better I am actually very pleased with where we stand I do believe that he is making excellent headway towards closure. 02-24-2023 upon evaluation today patient appears to be doing well currently in regard to his wound which is showing some signs of improvement which is excellent news. Fortunately I do not see any signs of active infection looking or systemically which is great news as well. No fevers, chills, nausea, vomiting, or diarrhea. We have gotten approval for [facility] but there was some issues with the provider in network status. Specifically with integrated wound specialist. Apparently were in network in a couple locations but I got Velna Hatchet to check it but not with the one that we bill under. Nonetheless I think that this is something that can be fixed and needs to be fixed ASAP. 03-10-2023 upon evaluation today patient's wound is actually showing signs of excellent improvement and looks to be doing much better. Fortunately I do not see any signs of active infection at this time which is great news that he has a little bit of irritation around the edges of the wound. I am going to recommend that we go ahead and work on diet in particular as far as cream is concerned I recommend topical mixup triamcinolone and nystatin to see if we help with some of the itching and irritation there. Otherwise the wound itself appears to be doing really well. 03-17-2023  upon evaluation today patient appears to be doing well currently in regard to his wounds. He is actually tolerating the dressing changes without complication. There is gena be some need here for sharp debridement and in general I do believe that he is making good headway towards closure. 03-23-2023 upon evaluation today patient appears to be doing excellent in  regard to his wound. He has been tolerating the dressing changes without complication. Fortunately I do not see any signs of active infection at this time. 12/31; patient has 4 small wounds on the right lateral ankle in the setting of chronic venous insufficiency. He is back at work. He is apparently on a waiting list for a venous ablation type procedure. I remember him from earlier in the year at that time he had really significant surrounding inflammation/infection. That is completely a bit abated now 04-07-23 upon evaluation today patient appears to be doing excellent in regard to his wounds. He has been tolerating the dressing changes without complication. Fortunately I do not see any evidence of worsening and I do believe that the patient is making excellent headway here towards closure. 04-14-23 upon evaluation today patient appears to be doing well currently in regard to his wound. He has been tolerating the dressing changes without complication. Fortunately I do not see any signs of infection his wound actually appears to be significantly improved which is great news. Objective Constitutional Well-nourished and well-hydrated in no acute distress. Vitals Time Taken: 7:52 AM, Height: 67 in, Weight: 174 lbs, BMI: 27.2, Temperature: 97.5 F, Pulse: 58 bpm, Respiratory Rate: 18 breaths/min, Blood Pressure: 132/78 mmHg. Respiratory normal breathing without difficulty. Psychiatric this patient is able to make decisions and demonstrates good insight into disease process. Alert and Oriented x 3. pleasant and cooperative. General Notes: Upon inspection patient's wound bed did require sharp debridement clearway necrotic debris tolerated the debridement today without complication and postdebridement wound bed appears to be doing significantly better which is excellent. Integumentary (Hair, Skin) Wound #1 status is Open. Original cause of wound was Gradually Appeared. The date acquired was: 08/29/2022. The  wound has been in treatment 21 weeks. The wound is located on the Right,Lateral Lower Leg. The wound measures 4cm length x 1cm width x 0.2cm depth; 3.142cm^2 area and 0.628cm^3 volume. There is Fat Layer (Subcutaneous Tissue) exposed. There is a small amount of serous drainage noted. There is large (67-100%) red, pink granulation within the wound bed. There is a small (1-33%) amount of necrotic tissue within the wound bed. 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 There was a Excisional Skin/Subcutaneous Tissue Debridement with a total area of 3.14 sq cm performed by Allen Derry, PA-C. With the following instrument(s): Curette Material removed includes Subcutaneous Tissue and Skin: Dermis and after achieving pain control using Lidocaine 2% Topical Gel. A time out was conducted at 08:05, prior to the start of the procedure. A Minimum amount of bleeding was controlled with Pressure. The procedure was tolerated well with a pain level of 0 throughout and a pain level of 0 following the procedure. Post Debridement Measurements: 4cm length x 1cm width x 0.2cm depth; 0.628cm^3 volume. Character of Wound/Ulcer Post Debridement is stable. Post  procedure Diagnosis Wound #1: Same as Pre-Procedure Plan Follow-up Appointments: Return Appointment in 1 week. Nurse Visit as needed Bathing/ Shower/ Hygiene: May shower with wound dressing protected with water repellent cover or cast protector. No tub bath. Anesthetic (Use 'Patient Medications' Section for Anesthetic Order Entry): Lidocaine applied to  wound bed WOUND #1: - Lower Leg Wound Laterality: Right, Lateral Cleanser: Vashe 5.8 (oz) Every Other Day/30 Days Discharge Instructions: Use vashe 5.8 (oz) as directed Topical: Triamcinolone Acetonide Cream, 0.1%, 15 (g) tube Every Other Day/30 Days Discharge Instructions: Apply as directed by provider. Prim Dressing: Promogran Matrix 4.34 (in) Every Other Day/30 Days ary Discharge Instructions: Moisten w/normal saline or sterile water; Cover wound as directed. Do not remove from wound bed. Prim Dressing: Xeroform-HBD 2x2 (in/in) Every Other Day/30 Days ary Discharge Instructions: Apply Xeroform-HBD 2x2 (in/in) as directed Secondary Dressing: ABD Pad 5x9 (in/in) (Generic) Every Other Day/30 Days Discharge Instructions: Cover with ABD pad Com pression Wrap: Urgo K2, two layer compression system, regular Every Other Day/30 Days 1. I would recommend based on what we are seeing that we have the patient continue to monitor for any signs of infection or worsening. Based on what I see I do feel like that the collagen and Xeroform is doing a really good job keeping this from drying out and doing awesome as far as helping new skin to grow this looks significantly improved even compared to last week. 2. I am also can recommend that the patient should continue to elevate his legs much as possible using the Urgo K2 compression wrap. We will see patient back for reevaluation in 1 week here in the clinic. If anything worsens or changes patient will contact our office for additional recommendations. Electronic Signature(s) Signed: 04/14/2023 6:06:47 PM By: Allen Derry PA-C Previous Signature: 04/14/2023 8:16:39 AM Version By: Allen Derry PA-C Entered By: Allen Derry on 04/14/2023 18:06:47 -------------------------------------------------------------------------------- SuperBill Details Patient Name: Date of Service: [name], Creedon M. 04/14/2023 Medical Record Number: [id number] Patient Account Number:  [id number] Date of Birth/Sex: Treating RN: 1955/06/04 (68 y.o. Barnett Abu, Leah Primary Care Provider: Saralyn Pilar Other Clinician: Referring Provider: Treating Provider/Extender: Tiburcio Pea in Treatment: 21 Diagnosis Coding ICD-10 Codes Gongora, [name] ([id number]) [id number] Page 9 of 9 Code Description I87.311 Chronic venous hypertension (idiopathic) with ulcer of right lower extremity L97.818 Non-pressure chronic ulcer of other part of right lower leg with other specified severity 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, unspecified Facility Procedures : CPT4 Code: 08657846 Description: 11042 - DEB SUBQ TISSUE 20 SQ CM/< ICD-10 Diagnosis Description L97.818 Non-pressure chronic ulcer of other part of right lower leg with other specified Modifier: severity Quantity: 1 Physician Procedures : CPT4 Code Description Modifier 11042 11042 - WC PHYS SUBQ TISS 20 SQ CM ICD-10 Diagnosis Description L97.818 Non-pressure chronic ulcer of other part of right lower leg with other specified severity Quantity: 1 Electronic Signature(s) Signed: 04/14/2023 6:06:56 PM By: Allen Derry PA-C Entered By: Allen Derry on 04/14/2023 18:06:56

## 2023-04-15 NOTE — Progress Notes (Signed)
[name], [name] ([id number]) [id number] Page 1 of 7 Visit Report for 04/14/2023 Arrival Information Details Patient Name: Date of Service: [name], [name]. 04/14/2023 7:45 A M Medical Record Number: [id number] Patient Account Number: [id number] Date of Birth/Sex: Treating RN: [name] (68 y.o. [name], [name] Primary Care [name]: [name] Other Clinician: Referring Vandy Tsuchiya: Treating [name]/Extender: [name] in Treatment: 21 Visit Information History Since Last Visit Added or deleted any medications: No Patient Arrived: [name] Any new allergies or adverse reactions: No Arrival Time: 07:50 Had a fall or experienced change in No Accompanied By: spouse activities of daily living that may affect Transfer Assistance: None risk of falls: Patient Requires Transmission-Based Precautions: No Signs or symptoms of abuse/neglect since last visito No Patient Has Alerts: Yes Has Dressing in Place as Prescribed: Yes Patient Alerts: R ABI 1.32 L ABI 1.24 Has Compression in Place as Prescribed: Yes R TBI .92 L TBI 1.05 Pain Present Now: No Electronic Signature(s) Signed: 04/14/2023 4:06:08 PM By: [name] Entered By: [name] on 04/14/2023 11:01:45 -------------------------------------------------------------------------------- Encounter Discharge Information Details Patient Name: Date of Service: [name], [name] 04/14/2023 7:45 A M Medical Record Number: [id number] Patient Account Number: [id number] Date of Birth/Sex: Treating RN: [name] (68 y.o. [name], [name] Primary Care [name]: [name] Other Clinician: Referring [name]: Treating [name]/Extender: [name] in Treatment: 21 Encounter Discharge Information Items Post Procedure Vitals Discharge Condition: Stable Temperature (F): 97.5 Ambulatory Status: Ambulatory Pulse (bpm): 58 Discharge  Destination: Home Respiratory Rate (breaths/min): 18 Transportation: Private Auto Blood Pressure (mmHg): 132/78 Accompanied By: spouse Schedule Follow-up Appointment: Yes Clinical Summary of Care: Electronic Signature(s) [name], [name] ([id number]) [id number] Page 2 of 7 Signed: 04/14/2023 4:06:08 PM By: [name] Entered By: [name] on 04/14/2023 11:05:23 -------------------------------------------------------------------------------- Lower Extremity Assessment Details Patient Name: Date of Service: [name], [name]. 04/14/2023 7:45 A M Medical Record Number: [id number] Patient Account Number: [id number] Date of Birth/Sex: Treating RN: [birth year] (68 y.o. [name], [name] Primary Care [name]: [name] Other Clinician: Referring Tomekia Helton: Treating Kaley Jutras/Extender: [name] in Treatment: 21 Edema Assessment Assessed: [Left: No] [name]: No] Edema: [Left: N] [Right: o] Vascular Assessment Pulses: Dorsalis Pedis Palpable: [Left:Yes] Posterior Tibial Palpable: [Left:Yes] Extremity colors, hair growth, and conditions: Extremity Color: [Left:Hyperpigmented] Hair Growth on Extremity: [Left:No] Temperature of Extremity: [Left:Warm] Capillary Refill: [Left:< 3 seconds No] Electronic Signature(s) Signed: 04/14/2023 4:06:08 PM By: [name] Entered By: [name] on 04/14/2023 11:03:10 -------------------------------------------------------------------------------- Multi Wound Chart Details Patient Name: Date of Service: [name], [name] 04/14/2023 7:45 A M Medical Record Number: [id number] Patient Account Number: [id number] Date of Birth/Sex: Treating RN: [birth year] (68 y.o. [name], [name] Primary Care Vicent Febles: [name] Other Clinician: Referring [name]: Treating [name]/Extender: [name] in Treatment: 21 Vital Signs Height(in):  67 Pulse(bpm): 58 Weight(lbs): 174 Blood Pressure(mmHg): 132/78 [name], [name] ([id number]) [phone].pdf Page 3 of 7 Body Mass Index(BMI): 27.2 Temperature(F): 97.5 Respiratory Rate(breaths/min): 18 [1:Photos:] [N/A:N/A] Right, Lateral Lower Leg N/A N/A Wound Location: Gradually Appeared N/A N/A Wounding Event: [name] N/A N/A Primary Etiology: Asthma, Hypertension N/A N/A Comorbid History: 08/29/2022 N/A N/A Date Acquired: 38 N/A N/A Weeks of Treatment: Open N/A N/A Wound Status: No N/A N/A Wound Recurrence: Yes N/A N/A Clustered Wound: 4x1x0.2 N/A N/A Measurements L x W x D (cm) 3.142 N/A N/A A (cm) : rea 0.628 N/A N/A Volume (cm) : 96.70% N/A N/A % Reduction in A  rea: 98.30% N/A N/A % Reduction in Volume: Full Thickness Without Exposed N/A N/A Classification: Support Structures Small N/A N/A Exudate Amount: Serous N/A N/A Exudate Type: amber N/A N/A Exudate Color: Large (67-100%) N/A N/A Granulation Amount: Red, Pink N/A N/A Granulation Quality: Small (1-33%) N/A N/A Necrotic Amount: Fat Layer (Subcutaneous Tissue): Yes N/A N/A Exposed Structures: Fascia: No Tendon: No Muscle: No Joint: No Bone: No None N/A N/A Epithelialization: Treatment Notes Electronic Signature(s) Signed: 04/14/2023 4:06:08 PM By: [name] Entered By: [name] on 04/14/2023 11:03:17 -------------------------------------------------------------------------------- Multi-Disciplinary Care Plan Details Patient Name: Date of Service: [name], [name] 04/14/2023 7:45 A M Medical Record Number: [id number] Patient Account Number: [id number] Date of Birth/Sex: Treating RN: [name] (68 y.o. [name], [name] Primary Care Kimorah Ridolfi: [name] Other Clinician: Referring [name]: Treating [name]/Extender: [name] in Treatment: [address] [name] ([id number])  [id number] Page 4 of 7 Wound/Skin Impairment Nursing Diagnoses: Knowledge deficit related to ulceration/compromised skin integrity Goals: Patient/caregiver will verbalize understanding of skin care regimen Date Initiated: 11/18/2022 Date Inactivated: 12/30/2022 Target Resolution Date: 12/19/2022 Goal Status: Met Ulcer/skin breakdown will have a volume reduction of 30% by week 4 Date Initiated: 11/18/2022 Date Inactivated: 12/30/2022 Target Resolution Date: 12/19/2022 Goal Status: Met Ulcer/skin breakdown will have a volume reduction of 50% by week 8 Date Initiated: 11/18/2022 Date Inactivated: 01/13/2023 Target Resolution Date: 01/18/2023 Goal Status: Met Ulcer/skin breakdown will have a volume reduction of 80% by week 12 Date Initiated: 11/18/2022 Date Inactivated: 02/17/2023 Target Resolution Date: 02/18/2023 Goal Status: Met Ulcer/skin breakdown will heal within 14 weeks Date Initiated: 11/18/2022 Target Resolution Date: 04/20/2023 Goal Status: Active Interventions: Assess patient/caregiver ability to obtain necessary supplies Assess patient/caregiver ability to perform ulcer/skin care regimen upon admission and as needed Assess ulceration(s) every visit Notes: Electronic Signature(s) Signed: 04/14/2023 4:06:08 PM By: [name] Entered By: [name] on 04/14/2023 11:04:32 -------------------------------------------------------------------------------- Pain Assessment Details Patient Name: Date of Service: [name], [name] 04/14/2023 7:45 A M Medical Record Number: [id number] Patient Account Number: [id number] Date of Birth/Sex: Treating RN: [name] (69 y.o. [name], [name] Primary Care Kylon Philbrook: [name] Other Clinician: Referring Derrall Hicks: Treating Anna Beaird/Extender: [name] in Treatment: 21 Active Problems Location of Pain Severity and Description of Pain Patient Has Paino No Site  Locations [place], [place] ([id number]) [id number] Page 5 of 7 Pain Management and Medication Current Pain Management: Electronic Signature(s) Signed: 04/14/2023 4:06:08 PM By: [name] Entered By: [name] on 04/14/2023 11:02:11 -------------------------------------------------------------------------------- Patient/Caregiver Education Details Patient Name: Date of Service: [name] 1/15/2025andnbsp7:45 A M Medical Record Number: [id number] Patient Account Number: [id number] Date of Birth/Gender: Treating RN: [name] (68 y.o. [name], [name] Primary Care Physician: [name] Other Clinician: Referring Physician: Treating Physician/Extender: [name] in Treatment: 21 Education Assessment Education Provided To: Patient Education Topics Provided Wound/Skin Impairment: Handouts: Caring for Your Ulcer Methods: Explain/Verbal Responses: State content correctly Electronic Signature(s) Signed: 04/14/2023 4:06:08 PM By: [name] Entered By: [name] on 04/14/2023 11:04:45 [place], [name] ([id number]) [id number] Page 6 of 7 -------------------------------------------------------------------------------- Wound Assessment Details Patient Name: Date of Service: [name], PANIK. 04/14/2023 7:45 A M Medical Record Number: [id number] Patient Account Number: [id number] Date of Birth/Sex: Treating RN: [name] (68 y.o. [name], [name] Primary Care Jaycen Vercher: [name] Other Clinician: Referring Yatzari Jonsson: Treating Makaylynn Bonillas/Extender: [name] in Treatment: 21 Wound Status Wound Number: 1 Primary Etiology: [name] Wound Location:  Right, Lateral Lower Leg Wound Status: Open Wounding Event: Gradually Appeared Comorbid History: Asthma, Hypertension Date Acquired: 08/29/2022 Weeks Of Treatment: 21 Clustered Wound:  Yes Photos Wound Measurements Length: (cm) 4 Width: (cm) 1 Depth: (cm) 0.2 Area: (cm) 3.142 Volume: (cm) 0.628 % Reduction in Area: 96.7% % Reduction in Volume: 98.3% Epithelialization: None Wound Description Classification: Full Thickness Without Exposed Suppor Exudate Amount: Small Exudate Type: Serous Exudate Color: amber t Structures Foul Odor After Cleansing: No Slough/Fibrino Yes Wound Bed Granulation Amount: Large (67-100%) Exposed Structure Granulation Quality: Red, Pink Fascia Exposed: No Necrotic Amount: Small (1-33%) Fat Layer (Subcutaneous Tissue) Exposed: Yes Tendon Exposed: No Muscle Exposed: No Joint Exposed: No Bone Exposed: No Treatment Notes Wound #1 (Lower Leg) Wound Laterality: Right, Lateral Cleanser Vashe 5.8 (oz) Discharge Instruction: Use vashe 5.8 (oz) as directed Peri-Wound Care Grattan, [name] ([id number]) [id number] Page 7 of 7 Topical Triamcinolone Acetonide Cream, 0.1%, 15 (g) tube Discharge Instruction: Apply as directed by Carrel Leather. Primary Dressing Promogran Matrix 4.34 (in) Discharge Instruction: Moisten w/normal saline or sterile water; Cover wound as directed. Do not remove from wound bed. Xeroform-HBD 2x2 (in/in) Discharge Instruction: Apply Xeroform-HBD 2x2 (in/in) as directed Secondary Dressing ABD Pad 5x9 (in/in) Discharge Instruction: Cover with ABD pad Secured With Compression Wrap Urgo K2, two layer compression system, regular Compression Stockings Add-Ons Electronic Signature(s) Signed: 04/14/2023 4:06:08 PM By: [name] Entered By: [name] on 04/14/2023 11:02:44 -------------------------------------------------------------------------------- Vitals Details Patient Name: Date of Service: [name], [name] 04/14/2023 7:45 A M Medical Record Number: [id number] Patient Account Number: [id number] Date of Birth/Sex: Treating RN: [birth year] (68 y.o. [name], [name] Primary Care Dalyn Becker:  [name] Other Clinician: Referring Lavell Ridings: Treating Lakyn Mantione/Extender: [name] in Treatment: 21 Vital Signs Time Taken: 07:52 Temperature (F): 97.5 Height (in): 67 Pulse (bpm): 58 Weight (lbs): 174 Respiratory Rate (breaths/min): 18 Body Mass Index (BMI): 27.2 Blood Pressure (mmHg): 132/78 Reference Range: 80 - 120 mg / dl Electronic Signature(s) Signed: 04/14/2023 4:06:08 PM By: [name] Entered By: [name] on 04/14/2023 11:02:02

## 2023-04-21 ENCOUNTER — Telehealth (INDEPENDENT_AMBULATORY_CARE_PROVIDER_SITE_OTHER): Payer: Self-pay | Admitting: Vascular Surgery

## 2023-04-21 ENCOUNTER — Encounter: Payer: BC Managed Care – PPO | Admitting: Physician Assistant

## 2023-04-21 DIAGNOSIS — L97812 Non-pressure chronic ulcer of other part of right lower leg with fat layer exposed: Secondary | ICD-10-CM | POA: Diagnosis not present

## 2023-04-21 DIAGNOSIS — I10 Essential (primary) hypertension: Secondary | ICD-10-CM | POA: Diagnosis not present

## 2023-04-21 DIAGNOSIS — J45909 Unspecified asthma, uncomplicated: Secondary | ICD-10-CM | POA: Diagnosis not present

## 2023-04-21 DIAGNOSIS — L97818 Non-pressure chronic ulcer of other part of right lower leg with other specified severity: Secondary | ICD-10-CM | POA: Diagnosis not present

## 2023-04-21 DIAGNOSIS — H908 Mixed conductive and sensorineural hearing loss, unspecified: Secondary | ICD-10-CM | POA: Diagnosis not present

## 2023-04-21 DIAGNOSIS — I87311 Chronic venous hypertension (idiopathic) with ulcer of right lower extremity: Secondary | ICD-10-CM | POA: Diagnosis not present

## 2023-04-21 NOTE — Telephone Encounter (Signed)
Patient is set for laser ablation appt for 1.31.25. Please send normal protocol RX for lasers to [facility] in [place].

## 2023-04-28 ENCOUNTER — Encounter: Payer: BC Managed Care – PPO | Admitting: Physician Assistant

## 2023-04-28 DIAGNOSIS — H908 Mixed conductive and sensorineural hearing loss, unspecified: Secondary | ICD-10-CM | POA: Diagnosis not present

## 2023-04-28 DIAGNOSIS — I87311 Chronic venous hypertension (idiopathic) with ulcer of right lower extremity: Secondary | ICD-10-CM | POA: Diagnosis not present

## 2023-04-28 DIAGNOSIS — J45909 Unspecified asthma, uncomplicated: Secondary | ICD-10-CM | POA: Diagnosis not present

## 2023-04-28 DIAGNOSIS — I10 Essential (primary) hypertension: Secondary | ICD-10-CM | POA: Diagnosis not present

## 2023-04-28 DIAGNOSIS — L97818 Non-pressure chronic ulcer of other part of right lower leg with other specified severity: Secondary | ICD-10-CM | POA: Diagnosis not present

## 2023-04-28 NOTE — Telephone Encounter (Signed)
Patient spouse was notified that xanax prescription has been called into pharmacy

## 2023-04-28 NOTE — Telephone Encounter (Signed)
Patient came into the office stating that the [facility] [place] has not received his RX for his laser on Friday 1.31.25 with Dr. Wyn Quaker. Please advise patient. Normal protocol RX for laser ablation. [facility] [place].

## 2023-04-28 NOTE — Telephone Encounter (Signed)
done

## 2023-04-30 ENCOUNTER — Encounter (INDEPENDENT_AMBULATORY_CARE_PROVIDER_SITE_OTHER): Payer: Self-pay | Admitting: Vascular Surgery

## 2023-04-30 ENCOUNTER — Ambulatory Visit (INDEPENDENT_AMBULATORY_CARE_PROVIDER_SITE_OTHER): Payer: BC Managed Care – PPO | Admitting: Vascular Surgery

## 2023-04-30 VITALS — BP 131/83 | HR 56 | Resp 18 | Ht 69.0 in | Wt 168.0 lb

## 2023-04-30 DIAGNOSIS — I83811 Varicose veins of right lower extremities with pain: Secondary | ICD-10-CM | POA: Insufficient documentation

## 2023-04-30 NOTE — Progress Notes (Signed)
  [name] is a 68 y.o. male who presents with symptomatic venous reflux  Past Medical History:  Diagnosis Date   Allergy    Asthma    Chronic kidney disease    GERD (gastroesophageal reflux disease)    Headache    Hypertension    TGA (transient global amnesia)     Past Surgical History:  Procedure Laterality Date   COLONOSCOPY WITH PROPOFOL N/A 12/04/2015   Procedure: COLONOSCOPY WITH PROPOFOL;  Surgeon: Scot Jun, MD;  Location: [facility] ENDOSCOPY;  Service: Endoscopy;  Laterality: N/A;   LITHOTRIPSY     URETEROSCOPY       Current Outpatient Medications:    albuterol (VENTOLIN HFA) 108 (90 Base) MCG/ACT inhaler, Inhale into the lungs every 6 (six) hours as needed for wheezing or shortness of breath., Disp: , Rfl:    amLODipine (NORVASC) 5 MG tablet, Take 1 tablet (5 mg total) by mouth daily., Disp: 90 tablet, Rfl: 3   aspirin EC 81 MG EC tablet, Take 1 tablet (81 mg total) by mouth daily., Disp: 120 tablet, Rfl: 0   losartan (COZAAR) 50 MG tablet, Take 50 mg by mouth daily., Disp: , Rfl:    mupirocin ointment (BACTROBAN) 2 %, Apply 1 Application topically 2 (two) times daily. For 2 weeks then pause and can repeat, use for skin sore/ulceration, Disp: 30 g, Rfl: 0   omeprazole (PRILOSEC) 40 MG capsule, Take by mouth., Disp: , Rfl:    tamsulosin (FLOMAX) 0.4 MG CAPS capsule, Take 1 capsule (0.4 mg total) by mouth every evening., Disp: 90 capsule, Rfl: 3  Allergies  Allergen Reactions   Seasonal Ic [Cholestatin]      Varicose veins of right lower extremity with pain     PLAN: The patient's right lower extremity was sterilely prepped and draped. The ultrasound machine was used to visualize the saphenous vein throughout its course. A segment in the upper calf was selected for access. The saphenous vein was accessed without difficulty using ultrasound guidance with a micropuncture needle. A 0.018 wire was then placed and a micropuncture sheath is placed.  A J-wire was then  placed but it while only traversed to the mid thigh due to a tortuous area where the saphenous vein joins a branch and then the true saphenous vein appears to be very small if not occluded in the upper thigh.  His reflux was largely in the calf and knee area so I felt that this would take care of the root of the problem.  The 65 cm sheath was then placed over the wire and the wire and dilator were removed. The laser fiber was then placed through the sheath and its tip was placed in the mid thigh at the junction to the branch where the wire would not traverse any further. Tumescent anesthesia was then created with a dilute lidocaine solution. Laser energy was then delivered with constant withdrawal of the sheath and laser fiber. Approximately 526 joules of energy were delivered over a length of 14 centimeters using a 1470 Hz VenaCure machine at 7 W. Sterile dressings were placed. The patient tolerated the procedure well without obvious complications.   Follow-up in 1 week with post-laser duplex.

## 2023-05-04 ENCOUNTER — Other Ambulatory Visit: Payer: Self-pay | Admitting: Family Medicine

## 2023-05-04 ENCOUNTER — Other Ambulatory Visit (INDEPENDENT_AMBULATORY_CARE_PROVIDER_SITE_OTHER): Payer: Self-pay | Admitting: Vascular Surgery

## 2023-05-04 DIAGNOSIS — Z87442 Personal history of urinary calculi: Secondary | ICD-10-CM

## 2023-05-04 DIAGNOSIS — I83811 Varicose veins of right lower extremities with pain: Secondary | ICD-10-CM

## 2023-05-05 ENCOUNTER — Encounter: Payer: BC Managed Care – PPO | Attending: Physician Assistant | Admitting: Physician Assistant

## 2023-05-05 DIAGNOSIS — L97818 Non-pressure chronic ulcer of other part of right lower leg with other specified severity: Secondary | ICD-10-CM | POA: Insufficient documentation

## 2023-05-05 DIAGNOSIS — I87311 Chronic venous hypertension (idiopathic) with ulcer of right lower extremity: Secondary | ICD-10-CM | POA: Diagnosis not present

## 2023-05-05 DIAGNOSIS — H908 Mixed conductive and sensorineural hearing loss, unspecified: Secondary | ICD-10-CM | POA: Insufficient documentation

## 2023-05-05 DIAGNOSIS — L97812 Non-pressure chronic ulcer of other part of right lower leg with fat layer exposed: Secondary | ICD-10-CM | POA: Diagnosis not present

## 2023-05-05 NOTE — Telephone Encounter (Signed)
 Requested Prescriptions  Pending Prescriptions Disp Refills   tamsulosin  (FLOMAX ) 0.4 MG CAPS capsule [Pharmacy Med Name: TAMSULOSIN  HCL 0.4 MG CAPSULE] 90 capsule 0    Sig: TAKE 1 CAPSULE BY MOUTH EVERY EVENING     Urology: Alpha-Adrenergic Blocker Passed - 05/05/2023 12:07 PM      Passed - PSA in normal range and within 360 days    PSA  Date Value Ref Range Status  07/01/2022 2.36 < OR = 4.00 ng/mL Final    Comment:    The total PSA value from this assay system is  standardized against the WHO standard. The test  result will be approximately 20% lower when compared  to the equimolar-standardized total PSA (Beckman  Coulter). Comparison of serial PSA results should be  interpreted with this fact in mind. . This test was performed using the Siemens  chemiluminescent method. Values obtained from  different assay methods cannot be used interchangeably. PSA levels, regardless of value, should not be interpreted as absolute evidence of the presence or absence of disease.          Passed - Last BP in normal range    BP Readings from Last 1 Encounters:  04/30/23 131/83         Passed - Valid encounter within last 12 months    Recent Outpatient Visits           4 months ago Pre-diabetes   [place] [facility] [place], Marsa PARAS, DO   10 months ago Annual physical exam   [place] [facility] [place] Marsa PARAS, DO   1 year ago Annual physical exam   [place] [facility] [place] Marsa PARAS, DO   2 years ago Benign hypertension with CKD (chronic kidney disease) stage III [facility])   [place] [facility] [place], Marsa PARAS, DO   2 years ago Benign hypertension with CKD (chronic kidney disease) stage III [facility])   [place] [facility] [place], Marsa PARAS, DO       Future Appointments             In 2 months [place], Marsa PARAS, DO  [facility] [place] [facility]

## 2023-05-07 ENCOUNTER — Ambulatory Visit (INDEPENDENT_AMBULATORY_CARE_PROVIDER_SITE_OTHER): Payer: BC Managed Care – PPO

## 2023-05-07 DIAGNOSIS — I83811 Varicose veins of right lower extremities with pain: Secondary | ICD-10-CM

## 2023-05-12 ENCOUNTER — Encounter: Payer: BC Managed Care – PPO | Admitting: Physician Assistant

## 2023-05-12 DIAGNOSIS — L97818 Non-pressure chronic ulcer of other part of right lower leg with other specified severity: Secondary | ICD-10-CM | POA: Diagnosis not present

## 2023-05-12 DIAGNOSIS — L97812 Non-pressure chronic ulcer of other part of right lower leg with fat layer exposed: Secondary | ICD-10-CM | POA: Diagnosis not present

## 2023-05-12 DIAGNOSIS — I87311 Chronic venous hypertension (idiopathic) with ulcer of right lower extremity: Secondary | ICD-10-CM | POA: Diagnosis not present

## 2023-05-12 DIAGNOSIS — H908 Mixed conductive and sensorineural hearing loss, unspecified: Secondary | ICD-10-CM | POA: Diagnosis not present

## 2023-05-16 ENCOUNTER — Other Ambulatory Visit: Payer: Self-pay | Admitting: Family Medicine

## 2023-05-16 ENCOUNTER — Encounter: Payer: Self-pay | Admitting: Family Medicine

## 2023-05-16 DIAGNOSIS — J9801 Acute bronchospasm: Secondary | ICD-10-CM

## 2023-05-17 ENCOUNTER — Other Ambulatory Visit: Payer: Self-pay

## 2023-05-17 MED ORDER — ALBUTEROL SULFATE HFA 108 (90 BASE) MCG/ACT IN AERS: 1.0000 | INHALATION_SPRAY | Freq: Four times a day (QID) | RESPIRATORY_TRACT | 2 refills | Status: AC | PRN

## 2023-05-19 ENCOUNTER — Encounter: Payer: BC Managed Care – PPO | Admitting: Physician Assistant

## 2023-05-19 DIAGNOSIS — H908 Mixed conductive and sensorineural hearing loss, unspecified: Secondary | ICD-10-CM | POA: Diagnosis not present

## 2023-05-19 DIAGNOSIS — I87311 Chronic venous hypertension (idiopathic) with ulcer of right lower extremity: Secondary | ICD-10-CM | POA: Diagnosis not present

## 2023-05-19 DIAGNOSIS — L97818 Non-pressure chronic ulcer of other part of right lower leg with other specified severity: Secondary | ICD-10-CM | POA: Diagnosis not present

## 2023-05-19 DIAGNOSIS — L97812 Non-pressure chronic ulcer of other part of right lower leg with fat layer exposed: Secondary | ICD-10-CM | POA: Diagnosis not present

## 2023-05-25 ENCOUNTER — Ambulatory Visit: Payer: Self-pay

## 2023-05-26 ENCOUNTER — Encounter: Payer: BC Managed Care – PPO | Admitting: Physician Assistant

## 2023-05-26 DIAGNOSIS — L97818 Non-pressure chronic ulcer of other part of right lower leg with other specified severity: Secondary | ICD-10-CM | POA: Diagnosis not present

## 2023-05-26 DIAGNOSIS — I87311 Chronic venous hypertension (idiopathic) with ulcer of right lower extremity: Secondary | ICD-10-CM | POA: Diagnosis not present

## 2023-05-26 DIAGNOSIS — H908 Mixed conductive and sensorineural hearing loss, unspecified: Secondary | ICD-10-CM | POA: Diagnosis not present

## 2023-05-28 ENCOUNTER — Other Ambulatory Visit: Payer: Self-pay

## 2023-05-28 ENCOUNTER — Ambulatory Visit
Admission: RE | Admit: 2023-05-28 | Discharge: 2023-05-28 | Disposition: A | Discharge status: Home / Self Care | Payer: Self-pay | Source: Ambulatory Visit | Attending: Emergency Medicine | Admitting: Emergency Medicine

## 2023-05-28 ENCOUNTER — Ambulatory Visit (INDEPENDENT_AMBULATORY_CARE_PROVIDER_SITE_OTHER): Payer: BC Managed Care – PPO | Admitting: Nurse Practitioner

## 2023-05-28 ENCOUNTER — Encounter (INDEPENDENT_AMBULATORY_CARE_PROVIDER_SITE_OTHER): Payer: Self-pay | Admitting: Nurse Practitioner

## 2023-05-28 VITALS — BP 114/67 | HR 68 | Resp 18 | Ht 69.0 in | Wt 165.4 lb

## 2023-05-28 VITALS — BP 117/77 | HR 70 | Temp 98.6°F | Resp 18

## 2023-05-28 DIAGNOSIS — L97919 Non-pressure chronic ulcer of unspecified part of right lower leg with unspecified severity: Secondary | ICD-10-CM | POA: Diagnosis not present

## 2023-05-28 DIAGNOSIS — I83811 Varicose veins of right lower extremities with pain: Secondary | ICD-10-CM

## 2023-05-28 DIAGNOSIS — N183 Chronic kidney disease, stage 3 unspecified: Secondary | ICD-10-CM

## 2023-05-28 DIAGNOSIS — J069 Acute upper respiratory infection, unspecified: Secondary | ICD-10-CM

## 2023-05-28 DIAGNOSIS — I129 Hypertensive chronic kidney disease with stage 1 through stage 4 chronic kidney disease, or unspecified chronic kidney disease: Secondary | ICD-10-CM

## 2023-05-28 DIAGNOSIS — I87311 Chronic venous hypertension (idiopathic) with ulcer of right lower extremity: Secondary | ICD-10-CM

## 2023-05-28 MED ORDER — AMOXICILLIN-POT CLAVULANATE 875-125 MG PO TABS: 1.0000 | ORAL_TABLET | Freq: Two times a day (BID) | ORAL | 0 refills | Status: DC

## 2023-05-28 NOTE — ED Triage Notes (Signed)
 Patient states he is unable to communicate with sign language, but also unable to communicate with my mask on.  Asked me to remove my mask in the room, which I am uncomfortable doing with his recent illness.  Will update Adrienne, as patient states he cannot hear me, although he answers my questions appropriately in room with raised volume.  Will make Adrienne aware.

## 2023-05-28 NOTE — Discharge Instructions (Signed)
 Today you are being treated for upper respiratory infection which most likely is now bacteria causing symptoms to linger  Take Augmentin  every morning and every evening for 7 days  You can take Tylenol and/or Ibuprofen as needed for fever reduction and pain relief.   For cough: honey 1/2 to 1 teaspoon (you can dilute the honey in water or another fluid).  You can also use guaifenesin and dextromethorphan for cough. You can use a humidifier for chest congestion and cough.  If you don't have a humidifier, you can sit in the bathroom with the hot shower running.      For sore throat: try warm salt water gargles, cepacol lozenges, throat spray, warm tea or water with lemon/honey, popsicles or ice, or OTC cold relief medicine for throat discomfort.   For congestion: take a daily anti-histamine like Zyrtec, Claritin, and a oral decongestant, such as pseudoephedrine.  You can also use Flonase 1-2 sprays in each nostril daily.   It is important to stay hydrated: drink plenty of fluids (water, gatorade/powerade/pedialyte, juices, or teas) to keep your throat moisturized and help further relieve irritation/discomfort.

## 2023-05-28 NOTE — ED Provider Notes (Signed)
 [name]    CSN: [id number] Arrival date & time: 05/28/23  1649      History   Chief Complaint Chief Complaint  Patient presents with   Cough    I believe my cold has developed into Bronchitis and I need some medicine to fight it again. I had as asthma attack a week ago. - Entered by patient    HPI [name] is a 68 y.o. male.   Patient presents for evaluation of a mild nonproductive cough, chest congestion, intermittent wheezing present for 2 weeks.  Symptoms worse when lying down.  Have not worsened but.  Has attempted use of Mucinex and albuterol inhaler which has provided some relief.  Denies presence of fever.  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 Authorizing Provider  amoxicillin-clavulanate (AUGMENTIN) 875-125 MG tablet Take 1 tablet by mouth every 12 (twelve) hours. 05/28/23  Yes Sanyiah Kanzler R, NP   albuterol (VENTOLIN HFA) 108 (90 Base) MCG/ACT inhaler Inhale 1-2 puffs into the lungs every 6 (six) hours as needed for wheezing or shortness of breath. 05/17/23   Karamalegos, Netta Neat, DO  amLODipine (NORVASC) 5 MG tablet Take 1 tablet (5 mg total) by mouth daily. 07/07/22   Karamalegos, Netta Neat, DO  aspirin EC 81 MG EC tablet Take 1 tablet (81 mg total) by mouth daily. 08/30/15   Adrian Saran, MD  losartan (COZAAR) 50 MG tablet Take 50 mg by mouth daily. 01/30/21   [provider]  mupirocin ointment (BACTROBAN) 2 % Apply 1 Application topically 2 (two) times daily. For 2 weeks then pause and can repeat, use for skin sore/ulceration 10/09/22   Lorre Munroe, NP  omeprazole (PRILOSEC) 40 MG capsule Take by mouth.    [provider]  tamsulosin (FLOMAX) 0.4 MG CAPS capsule TAKE 1 CAPSULE BY MOUTH EVERY EVENING 05/05/23   Karamalegos, Netta Neat, DO    Family History Family History  Problem Relation Age of Onset   COPD Mother    CAD Mother    Alzheimer's disease Father    Diabetes Father     Social History Social History   Tobacco Use   Smoking status: Never   Smokeless tobacco: Never  Vaping Use   Vaping status: Never Used  Substance Use Topics   Alcohol use: Yes    Alcohol/week: 2.0 standard drinks of  alcohol    Types: 2 Glasses of wine per week    Comment: once a month   Drug use: No     Allergies   Seasonal ic [cholestatin]   Review of Systems Review of Systems  Respiratory:  Positive for cough.      Physical Exam Triage Vital Signs ED Triage Vitals  Encounter Vitals Group     BP 05/28/23 1711 117/77     Systolic BP Percentile --      Diastolic BP Percentile --      Pulse Rate 05/28/23 1711 70     Resp 05/28/23 1711 18     Temp 05/28/23 1711 98.6 F (37 C)     Temp Source 05/28/23 1711 Oral     SpO2 05/28/23 1711 94 %     Weight --      Height --      Head Circumference --      Peak Flow --      Pain Score 05/28/23 1712 0      Pain Loc --      Pain Education --      Exclude from Growth Chart --    No data found.  Updated Vital Signs BP 117/77 (BP Location: Right Arm)   Pulse 70   Temp 98.6 F (37 C) (Oral)   Resp 18   SpO2 94%   Visual Acuity Right Eye Distance:   Left Eye Distance:   Bilateral Distance:    Right Eye Near:   Left Eye Near:    Bilateral Near:     Physical Exam Constitutional:      Appearance: Normal appearance.  HENT:     Right Ear: Tympanic membrane, ear canal and external ear normal.     Left Ear: Tympanic membrane, ear canal and external ear normal.     Nose: Congestion present. No rhinorrhea.     Mouth/Throat:     Pharynx: No oropharyngeal exudate or posterior oropharyngeal erythema.  Eyes:     Extraocular Movements: Extraocular movements intact.  Cardiovascular:     Rate and Rhythm: Normal rate and regular rhythm.     Pulses: Normal pulses.     Heart sounds: Normal heart sounds.  Pulmonary:     Effort: Pulmonary effort is normal.     Breath sounds: Normal breath sounds.  Neurological:     Mental Status: He is alert and oriented to person, place, and time. Mental status is at baseline.      UC Treatments / Results  Labs (all labs ordered are listed, but only abnormal results are displayed) Labs Reviewed - No data to display  EKG   Radiology No results found.  Procedures Procedures (including critical care time)  Medications Ordered in UC Medications - No data to display  Initial Impression / Assessment and Plan / UC Course  I have reviewed the triage vital signs and the nursing notes.  Pertinent labs & imaging results that were available during my care of the patient were reviewed by me and considered in my medical decision making (see chart for details).  aCute URI  Patient is in no signs of distress nor toxic appearing.  Vital signs are stable.  Low suspicion for pneumonia, pneumothorax or bronchitis and therefore will defer imaging.  Prescribed  Augmentin, declined prednisone prescription.May use additional over-the-counter medications as needed for supportive care.  May follow-up with urgent care as needed if symptoms persist or worsen.   Final Clinical Impressions(s) / UC Diagnoses  Final diagnoses:  Acute URI     Discharge Instructions      Today you are being treated for upper respiratory infection which most likely is now bacteria causing symptoms to linger  Take Augmentin  every morning and every evening for 7 days  You can take Tylenol and/or Ibuprofen as needed for fever reduction and pain relief.   For cough: honey 1/2 to 1 teaspoon (you can dilute the honey in water or another fluid).  You can also use guaifenesin and dextromethorphan for cough. You can use a humidifier for chest congestion and cough.  If you don't have a humidifier, you can sit in the bathroom with the hot shower running.      For sore throat: try warm salt water gargles, cepacol lozenges, throat spray, warm tea or water with lemon/honey, popsicles or ice, or OTC cold relief medicine for throat discomfort.   For congestion: take a daily anti-histamine like Zyrtec, Claritin, and a oral decongestant, such as pseudoephedrine.  You can also use Flonase 1-2 sprays in each nostril daily.   It is important to stay hydrated: drink plenty of fluids (water, gatorade/powerade/pedialyte, juices, or teas) to keep your throat moisturized and help further relieve irritation/discomfort.    ED Prescriptions     Medication Sig Dispense Auth. Provider   amoxicillin-clavulanate (AUGMENTIN) 875-125 MG tablet Take 1 tablet by mouth every 12 (twelve) hours. 14 tablet Kannan Proia, Elita Boone, NP      PDMP not reviewed this encounter.   Valinda Hoar, NP 05/28/23 [phone]

## 2023-05-30 NOTE — Progress Notes (Signed)
 Subjective:    Patient ID: [name], male    DOB: 1955/08/21, 68 y.o.   MRN: [id number] Chief Complaint  Patient presents with   Follow-up    4 week post right laser    Patient returns today following endovenous laser ablation of his right great saphenous vein.  Noninvasive studies show that it was successful.  Also the patient had a large venous ulceration which has been being treated by wound care.  It is noted that post laser he has noted significant improvement and now his wound is very nearly healed.  He still has some tenderness but that is resolving.    Review of Systems  Cardiovascular:  Negative for leg swelling.  Skin:  Positive for wound.  All other systems reviewed and are negative.      Objective:   Physical Exam Vitals reviewed.  HENT:     Head: Normocephalic.  Cardiovascular:     Rate and Rhythm: Normal rate.  Pulmonary:     Effort: Pulmonary effort is normal.  Skin:    General: Skin is warm and dry.  Neurological:     Mental Status: He is alert and oriented to person, place, and time.  Psychiatric:        Mood and Affect: Mood normal.        Behavior: Behavior normal.        Thought Content: Thought content normal.        Judgment: Judgment normal.     BP 114/67   Pulse 68   Resp 18   Ht 5\' 9"  (1.753 m)   Wt 165 lb 6.4 oz (75 kg)   BMI 24.43 kg/m   Past Medical History:  Diagnosis Date   Allergy    Asthma    Chronic kidney disease    GERD (gastroesophageal reflux disease)    Headache    Hypertension    TGA (transient global amnesia)     Social History   Socioeconomic History   Marital status: Married    Spouse name: Not on file   Number of children: Not on file   Years of education: College   Highest education level: Not on file  Occupational History   Not on file  Tobacco Use   Smoking status: Never   Smokeless tobacco: Never  Vaping Use   Vaping status: Never Used  Substance and Sexual Activity   Alcohol use: Yes     Alcohol/week: 2.0 standard drinks of alcohol    Types: 2 Glasses of wine per week    Comment: once a month   Drug use: No   Sexual activity: Not on file  Other Topics Concern   Not on file  Social History Narrative   Not on file   Social Drivers of Health   Financial Resource Strain: Not on file  Food Insecurity: Not on file  Transportation Needs: Not on file  Physical Activity: Not on file  Stress: Not on file  Social Connections: Not on file  Intimate Partner Violence: Not on file    Past Surgical History:  Procedure Laterality Date   COLONOSCOPY WITH PROPOFOL N/A 12/04/2015   Procedure: COLONOSCOPY WITH PROPOFOL;  Surgeon: Scot Jun, MD;  Location: [facility] ENDOSCOPY;  Service: Endoscopy;  Laterality: N/A;   LITHOTRIPSY     URETEROSCOPY      Family History  Problem Relation Age of Onset   COPD Mother    CAD Mother    Alzheimer's disease Father  Diabetes Father     Allergies  Allergen Reactions   Seasonal Ic [Cholestatin]        Latest Ref Rng & Units 07/01/2022    8:01 AM 11/25/2020    3:15 PM 05/23/2020    8:00 AM  CBC  WBC 3.8 - 10.8 Thousand/uL 6.5  8.9  6.1   Hemoglobin 13.2 - 17.1 g/dL 25.3  66.4  40.3   Hematocrit 38.5 - 50.0 % 47.9  47.3  46.1   Platelets 140 - 400 Thousand/uL 202  248  210       CMP     Component Value Date/Time   NA 143 07/01/2022 0801   NA 139 04/22/2018 0000   K 4.8 07/01/2022 0801   CL 106 07/01/2022 0801   CO2 28 07/01/2022 0801   GLUCOSE 116 (H) 07/01/2022 0801   BUN 23 07/01/2022 0801   BUN 25 (A) 04/22/2018 0000   CREATININE 1.60 (H) 07/01/2022 0801   CALCIUM 9.5 07/01/2022 0801   PROT 6.8 07/01/2022 0801   ALBUMIN 4.2 08/29/2015 1116   AST 16 07/01/2022 0801   ALT 19 07/01/2022 0801   ALKPHOS 92 08/29/2015 1116   BILITOT 0.5 07/01/2022 0801   EGFR 47 (L) 07/01/2022 0801   GFRNONAA 46 (L) 05/23/2020 0800     No results found.     Assessment & Plan:   1. Varicose veins of right lower extremity with  pain (Primary) Today the patient's laser of his right lower extremity was successful.  His wound has been decreasing and is very nearly healed at this time.  The patient does not have any significant residual varicosities that will require sclerotherapy.  At this time the patient is advised that once wound is healed to continue with use of medical grade compression stockings in addition to elevation and activity.  Patient is advised to follow-up with [place] on an as-needed basis.  2. Benign hypertension with CKD (chronic kidney disease) stage III (HCC) Continue antihypertensive medications as already ordered, these medications have been reviewed and there are no changes at this time.  3. Stasis edema with ulcer, right (HCC) Patient will continue to follow with wound care center regarding his ulceration   Current Outpatient Medications on File Prior to Visit  Medication Sig Dispense Refill   albuterol (VENTOLIN HFA) 108 (90 Base) MCG/ACT inhaler Inhale 1-2 puffs into the lungs every 6 (six) hours as needed for wheezing or shortness of breath. 8 g 2   amLODipine (NORVASC) 5 MG tablet Take 1 tablet (5 mg total) by mouth daily. 90 tablet 3   aspirin EC 81 MG EC tablet Take 1 tablet (81 mg total) by mouth daily. 120 tablet 0   losartan (COZAAR) 50 MG tablet Take 50 mg by mouth daily.     mupirocin ointment (BACTROBAN) 2 % Apply 1 Application topically 2 (two) times daily. For 2 weeks then pause and can repeat, use for skin sore/ulceration 30 g 0   omeprazole (PRILOSEC) 40 MG capsule Take by mouth.     tamsulosin (FLOMAX) 0.4 MG CAPS capsule TAKE 1 CAPSULE BY MOUTH EVERY EVENING 90 capsule 0   No current facility-administered medications on file prior to visit.    There are no Patient Instructions on file for this visit. No follow-ups on file.   Georgiana Spinner, NP

## 2023-06-02 ENCOUNTER — Encounter: Payer: BC Managed Care – PPO | Attending: Physician Assistant | Admitting: Physician Assistant

## 2023-06-02 DIAGNOSIS — L84 Corns and callosities: Secondary | ICD-10-CM | POA: Insufficient documentation

## 2023-06-02 DIAGNOSIS — L97812 Non-pressure chronic ulcer of other part of right lower leg with fat layer exposed: Secondary | ICD-10-CM | POA: Diagnosis not present

## 2023-06-02 DIAGNOSIS — H908 Mixed conductive and sensorineural hearing loss, unspecified: Secondary | ICD-10-CM | POA: Diagnosis not present

## 2023-06-02 DIAGNOSIS — I87311 Chronic venous hypertension (idiopathic) with ulcer of right lower extremity: Secondary | ICD-10-CM | POA: Insufficient documentation

## 2023-06-02 DIAGNOSIS — L97818 Non-pressure chronic ulcer of other part of right lower leg with other specified severity: Secondary | ICD-10-CM | POA: Insufficient documentation

## 2023-06-09 ENCOUNTER — Encounter: Payer: BC Managed Care – PPO | Admitting: Physician Assistant

## 2023-06-09 DIAGNOSIS — I87311 Chronic venous hypertension (idiopathic) with ulcer of right lower extremity: Secondary | ICD-10-CM | POA: Diagnosis not present

## 2023-06-09 DIAGNOSIS — I87301 Chronic venous hypertension (idiopathic) without complications of right lower extremity: Secondary | ICD-10-CM | POA: Diagnosis not present

## 2023-06-09 DIAGNOSIS — L84 Corns and callosities: Secondary | ICD-10-CM | POA: Diagnosis not present

## 2023-06-09 DIAGNOSIS — H908 Mixed conductive and sensorineural hearing loss, unspecified: Secondary | ICD-10-CM | POA: Diagnosis not present

## 2023-06-09 DIAGNOSIS — L97818 Non-pressure chronic ulcer of other part of right lower leg with other specified severity: Secondary | ICD-10-CM | POA: Diagnosis not present

## 2023-06-14 DIAGNOSIS — N1831 Chronic kidney disease, stage 3a: Secondary | ICD-10-CM | POA: Diagnosis not present

## 2023-06-14 DIAGNOSIS — N1330 Unspecified hydronephrosis: Secondary | ICD-10-CM | POA: Diagnosis not present

## 2023-06-14 DIAGNOSIS — I10 Essential (primary) hypertension: Secondary | ICD-10-CM | POA: Diagnosis not present

## 2023-06-14 DIAGNOSIS — N2581 Secondary hyperparathyroidism of renal origin: Secondary | ICD-10-CM | POA: Diagnosis not present

## 2023-06-16 ENCOUNTER — Ambulatory Visit: Admitting: Physician Assistant

## 2023-06-21 DIAGNOSIS — I10 Essential (primary) hypertension: Secondary | ICD-10-CM | POA: Diagnosis not present

## 2023-06-21 DIAGNOSIS — N2581 Secondary hyperparathyroidism of renal origin: Secondary | ICD-10-CM | POA: Diagnosis not present

## 2023-06-21 DIAGNOSIS — N1831 Chronic kidney disease, stage 3a: Secondary | ICD-10-CM | POA: Diagnosis not present

## 2023-06-30 ENCOUNTER — Other Ambulatory Visit: Payer: Self-pay

## 2023-06-30 DIAGNOSIS — R7303 Prediabetes: Secondary | ICD-10-CM

## 2023-06-30 DIAGNOSIS — I129 Hypertensive chronic kidney disease with stage 1 through stage 4 chronic kidney disease, or unspecified chronic kidney disease: Secondary | ICD-10-CM | POA: Diagnosis not present

## 2023-06-30 DIAGNOSIS — N401 Enlarged prostate with lower urinary tract symptoms: Secondary | ICD-10-CM | POA: Diagnosis not present

## 2023-06-30 DIAGNOSIS — N183 Chronic kidney disease, stage 3 unspecified: Secondary | ICD-10-CM

## 2023-06-30 DIAGNOSIS — Z0000 Encounter for general adult medical examination without abnormal findings: Secondary | ICD-10-CM

## 2023-06-30 DIAGNOSIS — E785 Hyperlipidemia, unspecified: Secondary | ICD-10-CM

## 2023-07-01 LAB — HEMOGLOBIN A1C
Hgb A1c MFr Bld: 6.1 %{Hb} — ABNORMAL HIGH (ref <5.7)
Mean Plasma Glucose: 128 mg/dL
eAG (mmol/L): 7.1 mmol/L

## 2023-07-01 LAB — LIPID PANEL
Cholesterol: 162 mg/dL (ref <200)
HDL: 47 mg/dL (ref >=40)
LDL Cholesterol (Calc): 96 mg/dL
Non-HDL Cholesterol (Calc): 115 mg/dL (ref <130)
Total CHOL/HDL Ratio: 3.4 (calc) (ref <5.0)
Triglycerides: 99 mg/dL (ref <150)

## 2023-07-01 LAB — COMPLETE METABOLIC PANEL WITHOUT GFR
AG Ratio: 1.5 (calc) (ref 1.0–2.5)
ALT: 15 U/L (ref 9–46)
AST: 15 U/L (ref 10–35)
Albumin: 4.1 g/dL (ref 3.6–5.1)
Alkaline phosphatase (APISO): 100 U/L (ref 35–144)
BUN/Creatinine Ratio: 12 (calc) (ref 6–22)
BUN: 19 mg/dL (ref 7–25)
CO2: 31 mmol/L (ref 20–32)
Calcium: 9.2 mg/dL (ref 8.6–10.3)
Chloride: 107 mmol/L (ref 98–110)
Creat: 1.57 mg/dL — ABNORMAL HIGH (ref 0.70–1.35)
Globulin: 2.7 g/dL (ref 1.9–3.7)
Glucose, Bld: 112 mg/dL — ABNORMAL HIGH (ref 65–99)
Potassium: 4.8 mmol/L (ref 3.5–5.3)
Sodium: 143 mmol/L (ref 135–146)
Total Bilirubin: 0.4 mg/dL (ref 0.2–1.2)
Total Protein: 6.8 g/dL (ref 6.1–8.1)

## 2023-07-01 LAB — CBC WITH DIFFERENTIAL/PLATELET
Absolute Lymphocytes: 1180 {cells}/uL (ref 850–3900)
Absolute Monocytes: 413 {cells}/uL (ref 200–950)
Basophils Absolute: 30 {cells}/uL (ref 0–200)
Basophils Relative: 0.5 %
Eosinophils Absolute: 301 {cells}/uL (ref 15–500)
Eosinophils Relative: 5.1 %
HCT: 46.8 % (ref 38.5–50.0)
Hemoglobin: 15.4 g/dL (ref 13.2–17.1)
MCH: 30.2 pg (ref 27.0–33.0)
MCHC: 32.9 g/dL (ref 32.0–36.0)
MCV: 91.8 fL (ref 80.0–100.0)
MPV: 9.7 fL (ref 7.5–12.5)
Monocytes Relative: 7 %
Neutro Abs: 3977 {cells}/uL (ref 1500–7800)
Neutrophils Relative %: 67.4 %
Platelets: 204 10*3/uL (ref 140–400)
RBC: 5.1 10*6/uL (ref 4.20–5.80)
RDW: 13.2 % (ref 11.0–15.0)
Total Lymphocyte: 20 %
WBC: 5.9 10*3/uL (ref 3.8–10.8)

## 2023-07-01 LAB — PSA: PSA: 2.36 ng/mL (ref <=4.00)

## 2023-07-01 LAB — TSH: TSH: 0.82 m[IU]/L (ref 0.40–4.50)

## 2023-07-07 ENCOUNTER — Ambulatory Visit (INDEPENDENT_AMBULATORY_CARE_PROVIDER_SITE_OTHER): Payer: Self-pay | Admitting: Family Medicine

## 2023-07-07 ENCOUNTER — Encounter: Payer: Self-pay | Admitting: Family Medicine

## 2023-07-07 VITALS — BP 122/78 | HR 65 | Ht 69.0 in | Wt 171.4 lb

## 2023-07-07 DIAGNOSIS — I129 Hypertensive chronic kidney disease with stage 1 through stage 4 chronic kidney disease, or unspecified chronic kidney disease: Secondary | ICD-10-CM | POA: Diagnosis not present

## 2023-07-07 DIAGNOSIS — N401 Enlarged prostate with lower urinary tract symptoms: Secondary | ICD-10-CM

## 2023-07-07 DIAGNOSIS — R351 Nocturia: Secondary | ICD-10-CM

## 2023-07-07 DIAGNOSIS — R7303 Prediabetes: Secondary | ICD-10-CM | POA: Diagnosis not present

## 2023-07-07 DIAGNOSIS — E785 Hyperlipidemia, unspecified: Secondary | ICD-10-CM

## 2023-07-07 DIAGNOSIS — Z0000 Encounter for general adult medical examination without abnormal findings: Secondary | ICD-10-CM

## 2023-07-07 DIAGNOSIS — N183 Chronic kidney disease, stage 3 unspecified: Secondary | ICD-10-CM

## 2023-07-07 DIAGNOSIS — Z87442 Personal history of urinary calculi: Secondary | ICD-10-CM

## 2023-07-07 MED ORDER — TAMSULOSIN HCL 0.4 MG PO CAPS: 0.4000 mg | ORAL_CAPSULE | Freq: Every evening | ORAL | 3 refills | Status: AC

## 2023-07-07 MED ORDER — AMLODIPINE BESYLATE 5 MG PO TABS: 5.0000 mg | ORAL_TABLET | Freq: Every day | ORAL | 3 refills | Status: AC

## 2023-07-07 NOTE — Patient Instructions (Addendum)
 Thank you for coming to the office today.  Refilled Amlodipine and Tamsulosin for 1 year with 90 day + refills  Recent Labs    12/07/22 1556 06/30/23 0753  HGBA1C 6.2* 6.1*   Consider future screening image Coronary Calcium Score Cardiac CT Scan. This is a screening test for patients aged 67-50+ with cardiovascular risk factors or who are healthy but would be interested in Cardiovascular Screening for heart disease. Even if there is a family history of heart disease, this imaging can be useful. Typically it can be done every 5+ years or at a different timeline we agree on  The scan will look at the chest and mainly focus on the heart and identify early signs of calcium build up or blockages within the heart arteries. It is not 100% accurate for identifying blockages or heart disease, but it is useful to help [place] predict who may have some early changes or be at risk in the future for a heart attack or cardiovascular problem.  The results are reviewed by a Cardiologist and they will document the results. It should become available on MyChart. Typically the results are divided into percentiles based on other patients of the same demographic and age. So it will compare your risk to others similar to you. If you have a higher score >99 or higher percentile >75%tile, it is recommended to consider Statin cholesterol therapy and or referral to Cardiologist. I will try to help explain your results and if we have questions we can contact the Cardiologist.  You will be contacted for scheduling. Usually it is done at any imaging facility through [facility], [facility] or [facility].  The cost is $99 flat fee total and it does not go through insurance, so no authorization is required.  DUE for FASTING BLOOD WORK (no food or drink after midnight before the lab appointment, only water or coffee without cream/sugar on the morning of)  SCHEDULE "Lab Only" visit in the morning  at the clinic for lab draw in 1 YEAR  - Make sure Lab Only appointment is at about 1 week before your next appointment, so that results will be available  For Lab Results, once available within 2-3 days of blood draw, you can can log in to MyChart online to view your results and a brief explanation. Also, we can discuss results at next follow-up visit.   Please schedule a Follow-up Appointment to: Return for 1 year fasting lab > 1 week later Annual Physical.  If you have any other questions or concerns, please feel free to call the office or send a message through MyChart. You may also schedule an earlier appointment if necessary.  Additionally, you may be receiving a survey about your experience at our office within a few days to 1 week by e-mail or mail. We value your feedback.  Saralyn Pilar, DO [facility] [place], [place]

## 2023-07-07 NOTE — Progress Notes (Signed)
 Subjective:    Patient ID: [name], male    DOB: [birth year], 68 y.o.   MRN: [id number]  [name] is a 68 y.o. male presenting on 07/07/2023 for Hypertension   HPI Discussed the use of AI scribe software for clinical note transcription with the patient, who gave verbal consent to proceed.  History of Present Illness   [name] is a 68 year old male who presents for an annual physical exam.       CHRONIC HTN with CKD-IIIb History of R Nephrolithiasis (prior 9mm stone that required lithotripsy and ureteroscopy) R Renal Atrophy (history hydronephrosis) L renal hydronephrosis / Pelvicaliectasis   Followed by Nephrology His kidney function has been stable, with recent creatinine levels similar to those from a year ago. His GFR is around 48, and he sees his nephrologist every four months, with the next appointment in July. Current Meds - Losartan 50mg  daily, Amlodipine 5mg  daily Reports good compliance, took meds today. Tolerating well, w/o complaints. Denies CP, dyspnea, HA, edema, dizziness / lightheadedness    Pre-Diabetes Improved A1c 6.1 CBGs: not checking Meds: not on med Currently on ACEi Lifestyle: Weight down 10-20 lbs over past 1-2 years Diet improved diet low carb now. Improved water intake. - Exercise (limited - less exercise but frequent walking) Denies hypoglycemia, polyuria, visual changes, numbness or tingling.    GERD Chronic GERD heartburn, controlled on PPI   History of Migraine HA No more migraines, with BP controlled   BPH nocturia Reports frequent nocturia symptoms, without worsening, no other significant urinary symptoms. Taking Tamsulosin 0.4mg  nightly Improved significantly with less nocturia Refill   PMH Nephrolithiasis   Lower Extremity / Venous Stasis Ulcer Issue due to venous reflux Took 7 months to heal Wound care specialist S/p vein ablation Now all healed. He wears compression socks  Health Maintenance:  Colonoscopy  12/04/15 negative next due 2027     07/07/2023    8:08 AM 07/07/2022    3:39 PM 07/01/2021    3:53 PM  Depression screen PHQ 2/9  Decreased Interest 0 0 0  Down, Depressed, Hopeless 0 0 0  PHQ - 2 Score 0 0 0  Altered sleeping 0 0 0  Tired, decreased energy 0 0 0  Change in appetite 0 0 0  Feeling bad or failure about yourself  0 0 0  Trouble concentrating 0 0 0  Moving slowly or fidgety/restless 0 0 0  Suicidal thoughts 0 0 0  PHQ-9 Score 0 0 0  Difficult doing work/chores  Not difficult at all Not difficult at all       07/07/2023    8:08 AM 07/07/2022    3:40 PM 07/01/2021    3:53 PM 03/14/2021    3:24 PM  GAD 7 : Generalized Anxiety Score  Nervous, Anxious, on Edge 0 0 0 0  Control/stop worrying 0 0 0 0  Worry too much - different things 0 0 0 0  Trouble relaxing 0 0 0 0  Restless 0 0 0 0  Easily annoyed or irritable 0 0 0 0  Afraid - awful might happen 0 0 0 0  Total GAD 7 Score 0 0 0 0  Anxiety Difficulty  Not difficult at all Not difficult at all Not difficult at all     Past Medical History:  Diagnosis Date   Allergy    Asthma    Chronic kidney disease    GERD (gastroesophageal reflux disease)    Headache  Hypertension    TGA (transient global amnesia)    Past Surgical History:  Procedure Laterality Date   COLONOSCOPY WITH PROPOFOL N/A 12/04/2015   Procedure: COLONOSCOPY WITH PROPOFOL;  Surgeon: Scot Jun, MD;  Location: [facility]COPY;  Service: Endoscopy;  Laterality: N/A;   LITHOTRIPSY     URETEROSCOPY     Social History   Socioeconomic History   Marital status: Married    Spouse name: Not on file   Number of children: Not on file   Years of education: College   Highest education level: Bachelor's degree (e.g., BA, AB, BS)  Occupational History   Not on file  Tobacco Use   Smoking status: Never   Smokeless tobacco: Never  Vaping Use   Vaping status: Never Used  Substance and Sexual Activity   Alcohol use: Yes    Alcohol/week: 2.0 standard  drinks of alcohol    Types: 2 Glasses of wine per week    Comment: once a month   Drug use: No   Sexual activity: Not on file  Other Topics Concern   Not on file  Social History Narrative   Not on file   Social Drivers of Health   Financial Resource Strain: Low Risk  (07/06/2023)   Overall Financial Resource Strain (CARDIA)    Difficulty of Paying Living Expenses: Not hard at all  Food Insecurity: No Food Insecurity (07/06/2023)   Hunger Vital Sign    Worried About Running Out of Food in the Last Year: Never true    Ran Out of Food in the Last Year: Never true  Transportation Needs: No Transportation Needs (07/06/2023)   PRAPARE - Administrator, Civil Service (Medical): No    Lack of Transportation (Non-Medical): No  Physical Activity: Sufficiently Active (07/06/2023)   Exercise Vital Sign    Days of Exercise per Week: 7 days    Minutes of Exercise per Session: 30 min  Stress: No Stress Concern Present (07/06/2023)   [employer] of Occupational Health - Occupational Stress Questionnaire    Feeling of Stress : Not at all  Social Connections: Socially Integrated (07/06/2023)   Social Connection and Isolation Panel [NHANES]    Frequency of Communication with Friends and Family: Never    Frequency of Social Gatherings with Friends and Family: More than three times a week    Attends Religious Services: More than 4 times per year    Active Member of [employer] or Organizations: Yes    Attends Engineer, structural: More than 4 times per year    Marital Status: Married  Catering manager Violence: Not on file   Family History  Problem Relation Age of Onset   COPD Mother    CAD Mother    Alzheimer's disease Father    Diabetes Father    Current Outpatient Medications on File Prior to Visit  Medication Sig   albuterol (VENTOLIN HFA) 108 (90 Base) MCG/ACT inhaler Inhale 1-2 puffs into the lungs every 6 (six) hours as needed for wheezing or shortness of breath.   aspirin  EC 81 MG EC tablet Take 1 tablet (81 mg total) by mouth daily.   losartan (COZAAR) 50 MG tablet Take 50 mg by mouth daily.   omeprazole (PRILOSEC) 40 MG capsule Take by mouth.   No current facility-administered medications on file prior to visit.    Review of Systems  Constitutional:  Negative for activity change, appetite change, chills, diaphoresis, fatigue and fever.  HENT:  Negative  for congestion and hearing loss.   Eyes:  Negative for visual disturbance.  Respiratory:  Negative for cough, chest tightness, shortness of breath and wheezing.   Cardiovascular:  Negative for chest pain, palpitations and leg swelling.  Gastrointestinal:  Negative for abdominal pain, constipation, diarrhea, nausea and vomiting.  Genitourinary:  Negative for dysuria, frequency and hematuria.  Musculoskeletal:  Negative for arthralgias and neck pain.  Skin:  Negative for rash.  Neurological:  Negative for dizziness, weakness, light-headedness, numbness and headaches.  Hematological:  Negative for adenopathy.  Psychiatric/Behavioral:  Negative for behavioral problems, dysphoric mood and sleep disturbance.    Per HPI unless specifically indicated above     Objective:    BP 122/78 (BP Location: Left Arm, Patient Position: Sitting, Cuff Size: Normal)   Pulse 65   Ht 5\' 9"  (1.753 m)   Wt 171 lb 6 oz (77.7 kg)   SpO2 100%   BMI 25.31 kg/m   Wt Readings from Last 3 Encounters:  07/07/23 171 lb 6 oz (77.7 kg)  05/28/23 165 lb 6.4 oz (75 kg)  04/30/23 168 lb (76.2 kg)    Physical Exam Vitals and nursing note reviewed.  Constitutional:      General: He is not in acute distress.    Appearance: He is well-developed. He is not diaphoretic.     Comments: Well-appearing, comfortable, cooperative  HENT:     Head: Normocephalic and atraumatic.  Eyes:     General:        Right eye: No discharge.        Left eye: No discharge.     Conjunctiva/sclera: Conjunctivae normal.     Pupils: Pupils are equal,  round, and reactive to light.  Neck:     Thyroid: No thyromegaly.  Cardiovascular:     Rate and Rhythm: Normal rate and regular rhythm.     Pulses: Normal pulses.     Heart sounds: Normal heart sounds. No murmur heard. Pulmonary:     Effort: Pulmonary effort is normal. No respiratory distress.     Breath sounds: Normal breath sounds. No wheezing or rales.  Abdominal:     General: Bowel sounds are normal. There is no distension.     Palpations: Abdomen is soft. There is no mass.     Tenderness: There is no abdominal tenderness.  Musculoskeletal:        General: No tenderness. Normal range of motion.     Cervical back: Normal range of motion and neck supple.     Right lower leg: No edema.     Left lower leg: No edema.     Comments: Upper / Lower Extremities: - Normal muscle tone, strength bilateral upper extremities 5/5, lower extremities 5/5  Lymphadenopathy:     Cervical: No cervical adenopathy.  Skin:    General: Skin is warm and dry.     Findings: No erythema or rash.  Neurological:     Mental Status: He is alert and oriented to person, place, and time.     Comments: Distal sensation intact to light touch all extremities  Psychiatric:        Mood and Affect: Mood normal.        Behavior: Behavior normal.        Thought Content: Thought content normal.     Comments: Well groomed, good eye contact, normal speech and thoughts     Results for orders placed or performed in visit on 06/30/23  TSH   Collection Time: 06/30/23  7:53 AM  Result Value Ref Range   TSH 0.82 0.40 - 4.50 mIU/L  PSA   Collection Time: 06/30/23  7:53 AM  Result Value Ref Range   PSA 2.36 < OR = 4.00 ng/mL  CBC with Differential/Platelet   Collection Time: 06/30/23  7:53 AM  Result Value Ref Range   WBC 5.9 3.8 - 10.8 Thousand/uL   RBC 5.10 4.20 - 5.80 Million/uL   Hemoglobin 15.4 13.2 - 17.1 g/dL   HCT 16.1 09.6 - 04.5 %   MCV 91.8 80.0 - 100.0 fL   MCH 30.2 27.0 - 33.0 pg   MCHC 32.9 32.0 -  36.0 g/dL   RDW 40.9 81.1 - 91.4 %   Platelets 204 140 - 400 Thousand/uL   MPV 9.7 7.5 - 12.5 fL   Neutro Abs 3,977 1,500 - 7,800 cells/uL   Absolute Lymphocytes 1,180 850 - 3,900 cells/uL   Absolute Monocytes 413 200 - 950 cells/uL   Eosinophils Absolute 301 15 - 500 cells/uL   Basophils Absolute 30 0 - 200 cells/uL   Neutrophils Relative % 67.4 %   Total Lymphocyte 20.0 %   Monocytes Relative 7.0 %   Eosinophils Relative 5.1 %   Basophils Relative 0.5 %  COMPLETE METABOLIC PANEL WITH GFR   Collection Time: 06/30/23  7:53 AM  Result Value Ref Range   Glucose, Bld 112 (H) 65 - 99 mg/dL   BUN 19 7 - 25 mg/dL   Creat 7.82 (H) 9.56 - 1.35 mg/dL   BUN/Creatinine Ratio 12 6 - 22 (calc)   Sodium 143 135 - 146 mmol/L   Potassium 4.8 3.5 - 5.3 mmol/L   Chloride 107 98 - 110 mmol/L   CO2 31 20 - 32 mmol/L   Calcium 9.2 8.6 - 10.3 mg/dL   Total Protein 6.8 6.1 - 8.1 g/dL   Albumin 4.1 3.6 - 5.1 g/dL   Globulin 2.7 1.9 - 3.7 g/dL (calc)   AG Ratio 1.5 1.0 - 2.5 (calc)   Total Bilirubin 0.4 0.2 - 1.2 mg/dL   Alkaline phosphatase (APISO) 100 35 - 144 U/L   AST 15 10 - 35 U/L   ALT 15 9 - 46 U/L  Lipid panel   Collection Time: 06/30/23  7:53 AM  Result Value Ref Range   Cholesterol 162 <200 mg/dL   HDL 47 > OR = 40 mg/dL   Triglycerides 99 <213 mg/dL   LDL Cholesterol (Calc) 96 mg/dL (calc)   Total CHOL/HDL Ratio 3.4 <5.0 (calc)   Non-HDL Cholesterol (Calc) 115 <130 mg/dL (calc)  Hemoglobin Y8M   Collection Time: 06/30/23  7:53 AM  Result Value Ref Range   Hgb A1c MFr Bld 6.1 (H) <5.7 % of total Hgb   Mean Plasma Glucose 128 mg/dL   eAG (mmol/L) 7.1 mmol/L   Recent Labs    12/07/22 1556 06/30/23 0753  HGBA1C 6.2* 6.1*        Assessment & Plan:   Problem List Items Addressed This Visit     Benign hypertension with CKD (chronic kidney disease) stage III (HCC)   Relevant Medications   amLODipine (NORVASC) 5 MG tablet   Benign prostatic hyperplasia with nocturia    History of nephrolithiasis   Relevant Medications   tamsulosin (FLOMAX) 0.4 MG CAPS capsule   Pre-diabetes   Other Visit Diagnoses       Annual physical exam    -  Primary     Dyslipidemia  Updated Health Maintenance information Reviewed recent lab results with patient Encouraged improvement to lifestyle with diet and exercise Goal of weight loss   Pre-Diabetes A1c of 6.1%. Improved glycemic control due to dietary changes. - Continue current dietary modifications. - Schedule follow-up in one year unless issues arise.  Hypertension Complication CKD-III Well-controlled with amlodipine and losartan  Chronic Kidney Disease III Stable with creatinine 1.58 and GFR 48. Under nephrology care. - Continue monitoring kidney function with nephrologist. - Follow-up with nephrologist in July.  Chronic Venous Insufficiency Venous ulcers healed post-ablation and debridement. Wearing compression stockings. - Continue wearing compression stockings.  Hyperlipidemia Well-managed with LDL 96 mg/dL. No cholesterol-lowering medication. Coronary artery calcium scoring CT scan recommended. - Consider coronary artery calcium scoring CT scan. - Continue current lifestyle modifications.  Benign Prostatic Hyperplasia Stable with PSA 2.36. Managed with tamsulosin. - Refill tamsulosin for one year with 90-day supply.  General Health Maintenance Declined pneumonia vaccine. Colonoscopy due in 2027. Maintaining healthy lifestyle. - Reassess interest in pneumonia vaccine at future visits. - Schedule colonoscopy in 2027.  Follow-up Comfortable with current management and annual follow-up. - Schedule annual follow-up visit in one year. - Provide printed summary of visit.         No orders of the defined types were placed in this encounter.   Meds ordered this encounter  Medications   amLODipine (NORVASC) 5 MG tablet    Sig: Take 1 tablet (5 mg total) by mouth daily.     Dispense:  90 tablet    Refill:  3    Please hold refills until patient requests   tamsulosin (FLOMAX) 0.4 MG CAPS capsule    Sig: Take 1 capsule (0.4 mg total) by mouth every evening.    Dispense:  90 capsule    Refill:  3    Please hold refills until patient requests     Follow up plan: Return for 1 year fasting lab > 1 week later Annual Physical.  Saralyn Pilar, DO [facility] 07/07/2023, 8:17 AM

## 2023-08-17 ENCOUNTER — Encounter (INDEPENDENT_AMBULATORY_CARE_PROVIDER_SITE_OTHER): Payer: Self-pay

## 2023-10-19 DIAGNOSIS — I10 Essential (primary) hypertension: Secondary | ICD-10-CM | POA: Diagnosis not present

## 2023-10-19 DIAGNOSIS — N1831 Chronic kidney disease, stage 3a: Secondary | ICD-10-CM | POA: Diagnosis not present

## 2023-10-19 DIAGNOSIS — N2581 Secondary hyperparathyroidism of renal origin: Secondary | ICD-10-CM | POA: Diagnosis not present

## 2023-10-25 DIAGNOSIS — I10 Essential (primary) hypertension: Secondary | ICD-10-CM | POA: Diagnosis not present

## 2023-10-25 DIAGNOSIS — N1831 Chronic kidney disease, stage 3a: Secondary | ICD-10-CM | POA: Diagnosis not present

## 2023-10-25 DIAGNOSIS — N2581 Secondary hyperparathyroidism of renal origin: Secondary | ICD-10-CM | POA: Diagnosis not present

## 2024-02-21 DIAGNOSIS — N1831 Chronic kidney disease, stage 3a: Secondary | ICD-10-CM | POA: Diagnosis not present

## 2024-02-21 DIAGNOSIS — N2581 Secondary hyperparathyroidism of renal origin: Secondary | ICD-10-CM | POA: Diagnosis not present

## 2024-02-21 DIAGNOSIS — I10 Essential (primary) hypertension: Secondary | ICD-10-CM | POA: Diagnosis not present

## 2024-02-28 ENCOUNTER — Encounter: Payer: Self-pay | Admitting: Nurse Practitioner

## 2024-02-28 ENCOUNTER — Other Ambulatory Visit: Payer: Self-pay | Admitting: Nurse Practitioner

## 2024-02-28 DIAGNOSIS — N1330 Unspecified hydronephrosis: Secondary | ICD-10-CM | POA: Diagnosis not present

## 2024-02-28 DIAGNOSIS — N1831 Chronic kidney disease, stage 3a: Secondary | ICD-10-CM

## 2024-02-28 DIAGNOSIS — I10 Essential (primary) hypertension: Secondary | ICD-10-CM

## 2024-02-28 DIAGNOSIS — N2581 Secondary hyperparathyroidism of renal origin: Secondary | ICD-10-CM | POA: Diagnosis not present

## 2024-02-28 NOTE — Progress Notes (Signed)
 ------------------------------------------------------------------------------- Attestation signed by Lateef, Munsoor, MD at 03/28/2024 10:50 AM I was present in the office and available for immediate consultation.  I agree with the note as documented.  -------------------------------------------------------------------------------  Follow Up Visit   Patient Name: [name], male   Patient DOB: Oct 16, 1955 Date of Service: 02/28/2024  Patient MRN: [id number] Provider Creating Note: Vernell VEAR Beams, AGNP-C  [phone] Primary Care Physician:   [address] [place] [address] Additional Physicians/ Providers:     History of Present Illness [name] is a 68 y.o. male who is following up today for chronic kidney disease stage IIIb, proteinuria, hypertension, and right-sided hydronephrosis.    Labs from 11/24 discussed with patient. Renal function has decreased with eGFR 33 from 48 in July 2025, creatinine 2.15 with a urine protein to creatinine ratio 0.10 Blood pressure 106/70, on losartan and cozaar.  Patient also has secondary hyperparathyroidism with most recent PTH 81, phosphorus 4.0, calcium 9.0  Hgb remains stable at 15.1. A1C 6.1.  S/p endovenous laser ablation of the right great saphenus vein in Apr 30, 2023. Wound was said to be healing well on follow up. Now with only a compression stocking on the right lower leg.  Remote h/o right nephrolithiasis, s/p lithotripsy resulting in decreased function in the right kidney. Denies recent stones. Last renal US  was a few years ago.    Medications   Current Outpatient Medications:    amLODIPine  (NORVASC ) 5 MG tablet, Take 5 mg by mouth 1 (one) time each day, Disp: , Rfl:    aspirin  (ST JOSEPH) 81 MG EC tablet, Take 81 mg by mouth, Disp: , Rfl:    clobetasol  (TEMOVATE ) 0.05 % ointment, Apply topically, Disp: , Rfl:    losartan (Cozaar) 50 MG tablet, Take 1 tablet (50 mg total) by mouth 1 (one) time each day, Disp: 90 tablet, Rfl:  3   omeprazole  (PriLOSEC ) 40 MG DR capsule, Take 40 mg by mouth in the morning., Disp: , Rfl:    tamsulosin  (FLOMAX ) 0.4 MG 24 hr capsule, Take 0.4 mg by mouth 1 (one) time each day in the evening, Disp: , Rfl:    Allergies Cholestatin  Problem List Patient Active Problem List  Diagnosis   Chronic kidney disease   Essential hypertension     Review of Systems  Constitutional:  Negative for chills and fever.  Respiratory:  Negative for cough and shortness of breath.   Cardiovascular:  Negative for chest pain and palpitations.  Gastrointestinal:  Negative for nausea and vomiting.  Genitourinary:  Negative for dysuria, hematuria and urgency.  Extremities: C/o intermittent pain right lower leg.   History Past Medical History:  Diagnosis Date   Chronic kidney disease    Esophageal reflux    Essential hypertension    Nephrolithiasis     Past Surgical History:  Procedure Laterality Date   LITHOTRIPSY     URETEROSCOPY     Family History  Problem Relation Age of Onset   Heart disease Mother    Dementia Father    Diabetes Father    Heart disease Brother    Social History   Tobacco Use   Smoking status: Never   Smokeless tobacco: Never  Substance Use Topics   Alcohol use: Not Currently        Physical Exam  Vitals BP 106/70 (BP Location: Right upper arm, Patient Position: Sitting)   Pulse 90   Temp 98 F   Wt 178 lb (80.7 kg)   SpO2 94%  BMI 27.06 kg/m   PHYSICAL EXAM: General appearance: well developed, well nourished, NAD Eyes: anicteric sclerae, moist conjunctivae; no lid-lag  HENT: Atraumatic; hearing intact Neck: Trachea midline; supple Lungs: CTAB, with normal respiratory effort  CV: S1S2, no murmurs or rubs. Abdomen: Soft, non-tender; bowel sounds present Extremities: No peripheral edema Skin: Warm and dry, normal skin turgor, no rashes noted. Psych: Appropriate affect, alert and oriented to person, place and time     Laboratory Studies  Chemistry  Lab Units 02/28/24 1550 02/21/24 1548 10/19/23 0832 06/30/23 0753 06/14/23 0837 02/15/23 1524 10/15/22 1503 07/01/22 0801 06/15/22 1534  SODIUM mmol/L 139 140 139 143 139 139 139 143 141  POTASSIUM mmol/L 3.8 4.3 4.5 4.8 4.4 4.4 4.6 4.8 4.0  CHLORIDE mmol/L 101 104 104 107 104 102 105 106 103  CO2 mmol/L 24 28 29 31 28 30 26 28 29   CALCIUM mg/dL 9.4 9.0 9.4 9.2 9.2 9.3 9.6 9.5 9.3  PHOSPHORUS mg/dL 4.1 4.0 2.3  --  2.6 3.9 3.3  --  3.7  ALK PHOS U/L  --   --   --  100  --   --   --  100  --   PTH pg/mL  --  81* 40  --  35 49 64  --  101*  GLUCOSE mg/dL 886* 99 69 887* 83 84 92 116* 99  ALBUMIN g/dL 4.4 4.4 4.1 4.1 4.0 4.2 4.3 4.1 4.0  BUN mg/dL 24 25 19 19 22 20  27* 23 22  CREATININE mg/dL 8.13* 7.84* 8.41* 8.42* 1.58* 1.71* 1.83* 1.60* 1.72*  HEMOGLOBIN A1C % of total Hgb  --   --   --  6.1*  --   --   --  6.4*  --         No lab exists for component: IRON SATURATION, TRANSSATPER  CBC  Lab Units 02/21/24 1548 10/19/23 0832 06/14/23 0837 02/15/23 1524 10/15/22 1503 06/15/22 1534  WBC AUTO Thousand/uL 8.7 6.0 6.2 9.6 7.6 9.4  HEMOGLOBIN g/dL 84.8 84.5 85.1 85.1 85.5 15.3  HEMOGLOBIN URINE  NEGATIVE NEGATIVE  --  NEGATIVE  --   --   HEMATOCRIT % 45.5 48.7 46.1 44.8 43.7 44.9  MCV fL 90.6 93.5 91.8 91.8 90.5 86.8  PLATELETS AUTO Thousand/uL 228 192 214 226 219 217    Urine  Lab Units 02/21/24 1548 10/19/23 0832 06/14/23 0837 02/15/23 1524 10/15/22 1503  COLOR U  YELLOW YELLOW  --  YELLOW  --   KETONES U MG/DL  TRACE* NEGATIVE  --  NEGATIVE  --   PROT/CREAT RATIO UR mg/g creat 0.100  100 0.478*  478* 0.167*  167* 0.298*  298*  --   ALB MG/G CREAT UR mg/g creat  --   --   --   --  5    Lab Results  Component Value Date   PTH 81 (H) 02/21/2024   CALCIUM 9.4 02/28/2024   PHOS 4.1 02/28/2024      Imaging and Other Studies  12/31/2020: Negative SPEP/UPEP/ANA/ANCA.  Positive ANA 1: 80 titer with negative secondary  panel. 01/08/2021: Renal ultrasound showed simple left renal cyst and mild left-sided hydronephrosis. CT scan 02/26/2021 of the abdomen and pelvis: Marked diffuse right renal cortical atrophy with chronic severe right hydronephrosis.  Left-sided pelvocaliectasis noted.   Orders Placed This Encounter   Ultrasound renal complete   Renal function panel       Impression/Recommendations  [name] is a 68 y.o. male with past medical  history of hypertension, hyperlipidemia, BPH, history of nephrolithiasis, history of positive ANA who was referred the evaluation of chronic kidney disease stage IIIb.  1.  Chronic kidney disease stage IIIb secondary to right renal cortical atrophy/right-sided hydronephrosis/proteinuria.  The patient's renal function is lower with an EGFR of 33, down from 48  four months ago. Urine protein to creatinine ratio 0.1.  Repeat eGFR was 39, more toward his baseline. He has not had a recent renal US  so I will send him for surveillance. Maintain the patient on losartan for now. Discussed avoidance of NSAIDs. Encouraged diet and exercise, drink around 3 bottles of water per day.  Patient will return in 6 weeks for repeat labs and to discuss the US  results.   2.  Hypertension.  Blood pressure is controlled.  Maintain the patient on amlodipine  and losartan for hypertension control. Low sodium diet.   3.  Secondary hyperparathyroidism, stable. No immediate need for calcitriol however we will continue to monitor bone metabolism parameters.   Return in about 6 weeks (around 04/10/2024).   Vernell DEL Hyler, AGNP-C

## 2024-03-02 ENCOUNTER — Ambulatory Visit
Admission: RE | Admit: 2024-03-02 | Discharge: 2024-03-02 | Attending: Nurse Practitioner | Admitting: Nurse Practitioner

## 2024-03-02 DIAGNOSIS — N132 Hydronephrosis with renal and ureteral calculous obstruction: Secondary | ICD-10-CM | POA: Diagnosis not present

## 2024-03-02 DIAGNOSIS — N1831 Chronic kidney disease, stage 3a: Secondary | ICD-10-CM | POA: Insufficient documentation

## 2024-03-02 DIAGNOSIS — I10 Essential (primary) hypertension: Secondary | ICD-10-CM | POA: Diagnosis not present

## 2024-03-02 DIAGNOSIS — N2889 Other specified disorders of kidney and ureter: Secondary | ICD-10-CM | POA: Diagnosis not present

## 2024-03-02 DIAGNOSIS — N1330 Unspecified hydronephrosis: Secondary | ICD-10-CM | POA: Diagnosis not present

## 2024-04-12 ENCOUNTER — Encounter: Payer: Self-pay | Admitting: Urology

## 2024-04-12 ENCOUNTER — Ambulatory Visit (INDEPENDENT_AMBULATORY_CARE_PROVIDER_SITE_OTHER): Admitting: Urology

## 2024-04-12 VITALS — BP 169/93 | HR 80 | Ht 69.0 in | Wt 182.0 lb

## 2024-04-12 DIAGNOSIS — N200 Calculus of kidney: Secondary | ICD-10-CM

## 2024-04-12 DIAGNOSIS — N1330 Unspecified hydronephrosis: Secondary | ICD-10-CM | POA: Diagnosis not present

## 2024-04-12 DIAGNOSIS — N289 Disorder of kidney and ureter, unspecified: Secondary | ICD-10-CM

## 2024-04-12 NOTE — Progress Notes (Signed)
 "  04/12/2024 4:59 PM   Creed CHRISTELLA Sousa 17-Aug-1955 [id number]  Referring provider: Lateef, Munsoor, MD [address] [place],  [place] [address]  Chief Complaint  Patient presents with   Nephrolithiasis   Reestablished patient visit  HPI: LI FRAGOSO is a 69 y.o. male referred by nephrology for nephrolithiasis.  Initially referred by nephrology and seen 03/27/2021 for severe right hydronephrosis.  CT at that time showed a severe right hydronephrosis with marked cortical atrophy and a 16 mm calculus in the posterior aspect of the renal pelvis.  Also noted to have mild right hydronephrosis on RUS Renal scan showed a nonfunctioning right kidney with differential function at 2%.  There was no evidence of left renal obstruction. Renal ultrasound performed 12//25 again showed severe right hydronephrosis with a 17 mm right renal pelvic stone.  There was felt to be moderate right hydronephrosis. He denies flank/abdominal/pelvic pain No bothersome LUTS Denies gross hematuria No history of pyelonephritis/febrile UTI   PMH: Past Medical History:  Diagnosis Date   Allergy    Asthma    Chronic kidney disease    GERD (gastroesophageal reflux disease)    Headache    Hypertension    TGA (transient global amnesia)     Surgical History: Past Surgical History:  Procedure Laterality Date   COLONOSCOPY WITH PROPOFOL  N/A 12/04/2015   Procedure: COLONOSCOPY WITH PROPOFOL ;  Surgeon: Lamar ONEIDA Holmes, MD;  Location: [facility] ENDOSCOPY;  Service: Endoscopy;  Laterality: N/A;   LITHOTRIPSY     URETEROSCOPY      Home Medications:  Allergies as of 04/12/2024       Reactions   Seasonal Ic [cholestatin]         Medication List        Accurate as of April 12, 2024  4:59 PM. If you have any questions, ask your nurse or doctor.          albuterol  108 (90 Base) MCG/ACT inhaler Commonly known as: VENTOLIN  HFA Inhale 1-2 puffs into the lungs every 6 (six) hours as needed for wheezing or  shortness of breath.   amLODipine  5 MG tablet Commonly known as: NORVASC  Take 1 tablet (5 mg total) by mouth daily.   aspirin  EC 81 MG tablet Take 1 tablet (81 mg total) by mouth daily.   losartan 50 MG tablet Commonly known as: COZAAR Take 50 mg by mouth daily.   omeprazole  40 MG capsule Commonly known as: PRILOSEC  Take by mouth.   tamsulosin  0.4 MG Caps capsule Commonly known as: FLOMAX  Take 1 capsule (0.4 mg total) by mouth every evening.        Allergies: Allergies[1]  Family History: Family History  Problem Relation Age of Onset   COPD Mother    CAD Mother    Alzheimer's disease Father    Diabetes Father     Social History:  reports that he has never smoked. He has never used smokeless tobacco. He reports current alcohol use of about 2.0 standard drinks of alcohol per week. He reports that he does not use drugs.   Physical Exam: BP (!) 169/93   Pulse 80   Ht 5' 9 (1.753 m)   Wt 182 lb (82.6 kg)   BMI 26.88 kg/m   Constitutional:  Alert, No acute distress. HEENT: [place] AT Respiratory: Normal respiratory effort, no increased work of breathing. Psychiatric: Normal mood and affect.   Pertinent Imaging: RUS images were personally reviewed and interpreted.  PVR was 86 mL left  ureteral jet is visualized  US  RENAL  Narrative EXAM: US  Retroperitoneum Complete, Renal. 03/02/2024 05:14:55 PM  TECHNIQUE: Real-time ultrasonography of the retroperitoneum renal was performed.  COMPARISON: US  Renal 01/08/2021.  CLINICAL HISTORY: Acute decline in renal function.  FINDINGS:  FINDINGS: RIGHT KIDNEY/URETER: Right kidney measures 8.1 cm in height and 8.6 cm in width. No normal renal tissue. Severe right hydronephrosis. 17 mm stone layering in the dilated renal pelvis. No mass.  LEFT KIDNEY/URETER: Left kidney measures 12.2 x 6.4 x 5.5 cm. Normal cortical echogenicity. Moderate left hydronephrosis. No calculus. No mass. Left ureteral jet  is visualized.  BLADDER: Pre-void bladder volume is 232 ml. Post-void bladder volume is 86 ml. Unremarkable appearance of the bladder.  IMPRESSION: 1. 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 2. Moderate left hydronephrosis.  Electronically signed by: Franky Crease MD 03/09/2024 01:30 AM EST RP Workstation: [id number]   Assessment & Plan:    1.  Right nephrolithiasis Asymptomatic 17 mm right renal calculus and a nonfunctioning kidney No treatment recommended  2.  Left hydronephrosis Nonobstructive hydronephrosis on diuretic renal scan Would repeat renal scan for progressive worsening renal function   Glendia JAYSON Barba, MD  [facility] [address] [place], [place] [address] [phone]    [1]  Allergies Allergen Reactions   Seasonal Ic [Cholestatin]    "

## 2024-07-07 ENCOUNTER — Other Ambulatory Visit

## 2024-07-14 ENCOUNTER — Encounter: Admitting: Family Medicine
